# Patient Record
Sex: Female | Born: 1935
Health system: Southern US, Community
[De-identification: ages and names within clinical notes are randomized; demographics above are authoritative.]

## PROBLEM LIST (undated history)

## (undated) DIAGNOSIS — M1990 Unspecified osteoarthritis, unspecified site: Secondary | ICD-10-CM

## (undated) DIAGNOSIS — Z950 Presence of cardiac pacemaker: Secondary | ICD-10-CM

## (undated) DIAGNOSIS — Z8639 Personal history of other endocrine, nutritional and metabolic disease: Secondary | ICD-10-CM

## (undated) DIAGNOSIS — M4802 Spinal stenosis, cervical region: Secondary | ICD-10-CM

## (undated) DIAGNOSIS — E785 Hyperlipidemia, unspecified: Secondary | ICD-10-CM

## (undated) DIAGNOSIS — I7300 Raynaud's syndrome without gangrene: Secondary | ICD-10-CM

## (undated) DIAGNOSIS — M810 Age-related osteoporosis without current pathological fracture: Secondary | ICD-10-CM

## (undated) DIAGNOSIS — H918X9 Other specified hearing loss, unspecified ear: Secondary | ICD-10-CM

## (undated) DIAGNOSIS — I080 Rheumatic disorders of both mitral and aortic valves: Secondary | ICD-10-CM

## (undated) DIAGNOSIS — Z862 Personal history of diseases of the blood and blood-forming organs and certain disorders involving the immune mechanism: Secondary | ICD-10-CM

## (undated) DIAGNOSIS — I10 Essential (primary) hypertension: Secondary | ICD-10-CM

## (undated) HISTORY — PX: OTHER SURGICAL HISTORY: SHX169

## (undated) HISTORY — DX: Other specified hearing loss, unspecified ear: H91.8X9

## (undated) HISTORY — DX: Age-related osteoporosis without current pathological fracture: M81.0

## (undated) HISTORY — DX: Unspecified osteoarthritis, unspecified site: M19.90

## (undated) HISTORY — DX: Essential (primary) hypertension: I10

## (undated) HISTORY — PX: PACEMAKER INSERTION: SHX728

## (undated) HISTORY — DX: Raynaud's syndrome without gangrene: I73.00

## (undated) HISTORY — DX: Personal history of diseases of the blood and blood-forming organs and certain disorders involving the immune mechanism: Z86.2

## (undated) HISTORY — PX: MITRAL VALVE REPAIR: SHX2039

## (undated) HISTORY — DX: Presence of cardiac pacemaker: Z95.0

## (undated) HISTORY — DX: Rheumatic disorders of both mitral and aortic valves: I08.0

## (undated) HISTORY — PX: CATARACT EXTRACTION, BILATERAL: SHX1313

## (undated) HISTORY — DX: Personal history of other endocrine, nutritional and metabolic disease: Z86.39

## (undated) HISTORY — PX: APPENDECTOMY: SHX54

## (undated) HISTORY — PX: ABDOMINAL HYSTERECTOMY: SHX81

## (undated) HISTORY — DX: Hyperlipidemia, unspecified: E78.5

---

## 2000-05-10 ENCOUNTER — Inpatient Hospital Stay (HOSPITAL_COMMUNITY): Admission: EM | Admit: 2000-05-10 | Discharge: 2000-05-16 | Payer: Self-pay | Admitting: Emergency Medicine

## 2000-05-10 ENCOUNTER — Encounter: Payer: Self-pay | Admitting: Emergency Medicine

## 2000-05-13 ENCOUNTER — Encounter: Payer: Self-pay | Admitting: Internal Medicine

## 2000-05-14 ENCOUNTER — Encounter: Payer: Self-pay | Admitting: Internal Medicine

## 2000-05-15 ENCOUNTER — Encounter: Payer: Self-pay | Admitting: Internal Medicine

## 2000-05-16 ENCOUNTER — Encounter: Payer: Self-pay | Admitting: Internal Medicine

## 2001-09-14 ENCOUNTER — Ambulatory Visit (HOSPITAL_COMMUNITY): Admission: RE | Admit: 2001-09-14 | Discharge: 2001-09-14 | Payer: Self-pay | Admitting: Cardiology

## 2001-11-09 ENCOUNTER — Encounter: Payer: Self-pay | Admitting: Thoracic Surgery (Cardiothoracic Vascular Surgery)

## 2001-11-13 ENCOUNTER — Inpatient Hospital Stay (HOSPITAL_COMMUNITY)
Admission: RE | Admit: 2001-11-13 | Discharge: 2001-11-20 | Payer: Self-pay | Admitting: Thoracic Surgery (Cardiothoracic Vascular Surgery)

## 2001-11-13 ENCOUNTER — Encounter (INDEPENDENT_AMBULATORY_CARE_PROVIDER_SITE_OTHER): Payer: Self-pay | Admitting: *Deleted

## 2001-11-14 ENCOUNTER — Encounter: Payer: Self-pay | Admitting: Thoracic Surgery (Cardiothoracic Vascular Surgery)

## 2001-11-15 ENCOUNTER — Encounter: Payer: Self-pay | Admitting: Thoracic Surgery (Cardiothoracic Vascular Surgery)

## 2001-11-16 ENCOUNTER — Encounter: Payer: Self-pay | Admitting: Thoracic Surgery (Cardiothoracic Vascular Surgery)

## 2001-11-17 ENCOUNTER — Encounter: Payer: Self-pay | Admitting: Thoracic Surgery (Cardiothoracic Vascular Surgery)

## 2002-01-15 ENCOUNTER — Encounter (HOSPITAL_COMMUNITY): Admission: RE | Admit: 2002-01-15 | Discharge: 2002-04-15 | Payer: Self-pay | Admitting: Cardiology

## 2004-04-03 ENCOUNTER — Ambulatory Visit: Payer: Self-pay

## 2004-05-05 ENCOUNTER — Ambulatory Visit: Payer: Self-pay

## 2004-06-06 ENCOUNTER — Ambulatory Visit: Payer: Self-pay | Admitting: Internal Medicine

## 2004-06-30 ENCOUNTER — Ambulatory Visit: Payer: Self-pay | Admitting: Internal Medicine

## 2004-07-03 ENCOUNTER — Ambulatory Visit: Payer: Self-pay | Admitting: Cardiology

## 2004-07-15 ENCOUNTER — Ambulatory Visit: Payer: Self-pay | Admitting: Internal Medicine

## 2004-07-16 ENCOUNTER — Ambulatory Visit: Payer: Self-pay

## 2004-08-11 ENCOUNTER — Ambulatory Visit: Payer: Self-pay | Admitting: Cardiology

## 2004-08-13 ENCOUNTER — Ambulatory Visit: Payer: Self-pay | Admitting: Internal Medicine

## 2004-09-17 ENCOUNTER — Ambulatory Visit: Payer: Self-pay | Admitting: Internal Medicine

## 2004-10-19 ENCOUNTER — Ambulatory Visit: Payer: Self-pay | Admitting: Internal Medicine

## 2004-12-04 ENCOUNTER — Ambulatory Visit: Payer: Self-pay | Admitting: Internal Medicine

## 2005-01-01 ENCOUNTER — Ambulatory Visit: Payer: Self-pay | Admitting: Internal Medicine

## 2005-02-02 ENCOUNTER — Ambulatory Visit: Payer: Self-pay | Admitting: Internal Medicine

## 2005-02-17 ENCOUNTER — Ambulatory Visit: Payer: Self-pay | Admitting: Cardiology

## 2005-03-19 ENCOUNTER — Ambulatory Visit: Payer: Self-pay | Admitting: Internal Medicine

## 2005-04-19 ENCOUNTER — Ambulatory Visit: Payer: Self-pay | Admitting: Internal Medicine

## 2005-05-26 ENCOUNTER — Ambulatory Visit: Payer: Self-pay | Admitting: Internal Medicine

## 2005-07-07 ENCOUNTER — Ambulatory Visit: Payer: Self-pay | Admitting: Internal Medicine

## 2005-07-14 ENCOUNTER — Ambulatory Visit: Payer: Self-pay | Admitting: Internal Medicine

## 2005-09-13 ENCOUNTER — Ambulatory Visit: Payer: Self-pay

## 2005-10-13 ENCOUNTER — Ambulatory Visit: Payer: Self-pay | Admitting: Internal Medicine

## 2005-11-05 ENCOUNTER — Ambulatory Visit: Payer: Self-pay | Admitting: Cardiology

## 2005-11-09 ENCOUNTER — Ambulatory Visit: Payer: Self-pay | Admitting: Cardiology

## 2005-11-17 ENCOUNTER — Ambulatory Visit: Payer: Self-pay | Admitting: Internal Medicine

## 2005-11-22 ENCOUNTER — Encounter: Payer: Self-pay | Admitting: Cardiology

## 2005-11-22 ENCOUNTER — Ambulatory Visit: Payer: Self-pay

## 2005-11-29 ENCOUNTER — Ambulatory Visit: Payer: Self-pay | Admitting: Cardiology

## 2005-12-15 ENCOUNTER — Ambulatory Visit: Payer: Self-pay | Admitting: Internal Medicine

## 2006-01-04 ENCOUNTER — Ambulatory Visit: Payer: Self-pay | Admitting: Internal Medicine

## 2006-01-12 ENCOUNTER — Ambulatory Visit: Payer: Self-pay | Admitting: Internal Medicine

## 2006-01-19 ENCOUNTER — Ambulatory Visit: Payer: Self-pay | Admitting: Cardiology

## 2006-02-09 ENCOUNTER — Ambulatory Visit: Payer: Self-pay | Admitting: Internal Medicine

## 2006-03-18 ENCOUNTER — Ambulatory Visit: Payer: Self-pay | Admitting: Internal Medicine

## 2006-04-13 ENCOUNTER — Ambulatory Visit: Payer: Self-pay | Admitting: Internal Medicine

## 2006-05-13 ENCOUNTER — Ambulatory Visit: Payer: Self-pay | Admitting: Internal Medicine

## 2006-06-16 ENCOUNTER — Ambulatory Visit: Payer: Self-pay | Admitting: Internal Medicine

## 2006-07-13 ENCOUNTER — Ambulatory Visit: Payer: Self-pay | Admitting: Internal Medicine

## 2006-07-26 ENCOUNTER — Ambulatory Visit: Payer: Self-pay | Admitting: Cardiology

## 2006-07-28 ENCOUNTER — Ambulatory Visit: Payer: Self-pay | Admitting: Cardiology

## 2006-07-28 LAB — CONVERTED CEMR LAB
ALT: 12 units/L (ref 0–40)
AST: 22 units/L (ref 0–37)
Albumin: 3.3 g/dL — ABNORMAL LOW (ref 3.5–5.2)
Alkaline Phosphatase: 61 units/L (ref 39–117)
BUN: 14 mg/dL (ref 6–23)
Bilirubin, Direct: 0.1 mg/dL (ref 0.0–0.3)
CO2: 35 meq/L — ABNORMAL HIGH (ref 19–32)
Calcium: 9.3 mg/dL (ref 8.4–10.5)
Chloride: 105 meq/L (ref 96–112)
Cholesterol: 143 mg/dL (ref 0–200)
Creatinine, Ser: 0.9 mg/dL (ref 0.4–1.2)
GFR calc Af Amer: 78 mL/min
GFR calc non Af Amer: 64 mL/min
Glucose, Bld: 84 mg/dL (ref 70–99)
HDL: 66.9 mg/dL (ref >39.0)
LDL Cholesterol: 60 mg/dL (ref 0–99)
Potassium: 3.9 meq/L (ref 3.5–5.1)
Sodium: 145 meq/L (ref 135–145)
Total Bilirubin: 0.7 mg/dL (ref 0.3–1.2)
Total CHOL/HDL Ratio: 2.1
Total Protein: 6.2 g/dL (ref 6.0–8.3)
Triglycerides: 80 mg/dL (ref 0–149)
VLDL: 16 mg/dL (ref 0–40)

## 2006-08-10 ENCOUNTER — Ambulatory Visit: Payer: Self-pay | Admitting: Internal Medicine

## 2006-09-08 ENCOUNTER — Ambulatory Visit: Payer: Self-pay

## 2006-09-21 ENCOUNTER — Ambulatory Visit: Payer: Self-pay | Admitting: Internal Medicine

## 2006-12-15 ENCOUNTER — Ambulatory Visit: Payer: Self-pay | Admitting: Internal Medicine

## 2007-01-06 ENCOUNTER — Encounter: Payer: Self-pay | Admitting: Internal Medicine

## 2007-01-06 DIAGNOSIS — E785 Hyperlipidemia, unspecified: Secondary | ICD-10-CM | POA: Insufficient documentation

## 2007-01-06 DIAGNOSIS — I080 Rheumatic disorders of both mitral and aortic valves: Secondary | ICD-10-CM

## 2007-01-06 DIAGNOSIS — I10 Essential (primary) hypertension: Secondary | ICD-10-CM | POA: Insufficient documentation

## 2007-01-06 DIAGNOSIS — Z862 Personal history of diseases of the blood and blood-forming organs and certain disorders involving the immune mechanism: Secondary | ICD-10-CM

## 2007-01-06 DIAGNOSIS — Z950 Presence of cardiac pacemaker: Secondary | ICD-10-CM | POA: Insufficient documentation

## 2007-01-06 DIAGNOSIS — I7300 Raynaud's syndrome without gangrene: Secondary | ICD-10-CM | POA: Insufficient documentation

## 2007-01-06 DIAGNOSIS — Z8639 Personal history of other endocrine, nutritional and metabolic disease: Secondary | ICD-10-CM

## 2007-01-06 HISTORY — DX: Rheumatic disorders of both mitral and aortic valves: I08.0

## 2007-01-06 HISTORY — DX: Personal history of diseases of the blood and blood-forming organs and certain disorders involving the immune mechanism: Z86.2

## 2007-01-06 HISTORY — DX: Raynaud's syndrome without gangrene: I73.00

## 2007-01-06 HISTORY — DX: Presence of cardiac pacemaker: Z95.0

## 2007-01-14 DIAGNOSIS — M1990 Unspecified osteoarthritis, unspecified site: Secondary | ICD-10-CM | POA: Insufficient documentation

## 2007-01-14 DIAGNOSIS — M810 Age-related osteoporosis without current pathological fracture: Secondary | ICD-10-CM | POA: Insufficient documentation

## 2007-01-14 HISTORY — DX: Age-related osteoporosis without current pathological fracture: M81.0

## 2007-01-14 HISTORY — DX: Unspecified osteoarthritis, unspecified site: M19.90

## 2007-03-21 ENCOUNTER — Ambulatory Visit: Payer: Self-pay | Admitting: Internal Medicine

## 2007-06-06 ENCOUNTER — Encounter: Payer: Self-pay | Admitting: Internal Medicine

## 2007-06-21 ENCOUNTER — Ambulatory Visit: Payer: Self-pay | Admitting: Internal Medicine

## 2007-09-18 ENCOUNTER — Ambulatory Visit: Payer: Self-pay | Admitting: Internal Medicine

## 2007-10-09 ENCOUNTER — Encounter: Payer: Self-pay | Admitting: Internal Medicine

## 2007-11-28 ENCOUNTER — Ambulatory Visit: Payer: Self-pay | Admitting: Internal Medicine

## 2007-11-28 LAB — CONVERTED CEMR LAB
ALT: 10 units/L (ref 0–35)
AST: 29 units/L (ref 0–37)
Albumin: 3.7 g/dL (ref 3.5–5.2)
Alkaline Phosphatase: 94 units/L (ref 39–117)
BUN: 13 mg/dL (ref 6–23)
Basophils Absolute: 0.1 10*3/uL (ref 0.0–0.1)
Basophils Relative: 0.8 % (ref 0.0–3.0)
Bilirubin Urine: NEGATIVE
Bilirubin, Direct: 0.1 mg/dL (ref 0.0–0.3)
CO2: 31 meq/L (ref 19–32)
Calcium: 10.2 mg/dL (ref 8.4–10.5)
Chloride: 103 meq/L (ref 96–112)
Cholesterol: 146 mg/dL (ref 0–200)
Creatinine, Ser: 0.8 mg/dL (ref 0.4–1.2)
Eosinophils Absolute: 0.2 10*3/uL (ref 0.0–0.7)
Eosinophils Relative: 3.7 % (ref 0.0–5.0)
Folate: >20 ng/mL
GFR calc Af Amer: 89 mL/min
GFR calc non Af Amer: 73 mL/min
Glucose, Bld: 90 mg/dL (ref 70–99)
HCT: 37.7 % (ref 36.0–46.0)
HDL: 48.7 mg/dL (ref >39.0)
Hemoglobin: 12.8 g/dL (ref 12.0–15.0)
Ketones, ur: NEGATIVE mg/dL
LDL Cholesterol: 72 mg/dL (ref 0–99)
Leukocytes, UA: NEGATIVE
Lymphocytes Relative: 29.8 % (ref 12.0–46.0)
MCHC: 33.9 g/dL (ref 30.0–36.0)
MCV: 97.2 fL (ref 78.0–100.0)
Monocytes Absolute: 0.6 10*3/uL (ref 0.1–1.0)
Monocytes Relative: 8.7 % (ref 3.0–12.0)
Neutro Abs: 3.8 10*3/uL (ref 1.4–7.7)
Neutrophils Relative %: 57 % (ref 43.0–77.0)
Nitrite: NEGATIVE
Platelets: 250 10*3/uL (ref 150–400)
Potassium: 3.8 meq/L (ref 3.5–5.1)
RBC: 3.88 M/uL (ref 3.87–5.11)
RDW: 13.2 % (ref 11.5–14.6)
Sed Rate: 35 mm/hr — ABNORMAL HIGH (ref 0–22)
Sodium: 141 meq/L (ref 135–145)
Specific Gravity, Urine: 1.015 (ref 1.000–1.03)
TSH: 1.18 microintl units/mL (ref 0.35–5.50)
Total Bilirubin: 0.8 mg/dL (ref 0.3–1.2)
Total CHOL/HDL Ratio: 3
Total Protein, Urine: NEGATIVE mg/dL
Total Protein: 6.7 g/dL (ref 6.0–8.3)
Triglycerides: 125 mg/dL (ref 0–149)
Urine Glucose: NEGATIVE mg/dL
Urobilinogen, UA: 0.2 (ref 0.0–1.0)
VLDL: 25 mg/dL (ref 0–40)
Vitamin B-12: 544 pg/mL (ref 211–911)
WBC: 6.7 10*3/uL (ref 4.5–10.5)
pH: 7 (ref 5.0–8.0)

## 2007-11-29 LAB — CONVERTED CEMR LAB: Vit D, 1,25-Dihydroxy: 41 (ref 30–89)

## 2007-12-20 ENCOUNTER — Ambulatory Visit: Payer: Self-pay | Admitting: Internal Medicine

## 2008-01-09 ENCOUNTER — Ambulatory Visit: Payer: Self-pay | Admitting: Cardiovascular Disease

## 2008-01-09 ENCOUNTER — Inpatient Hospital Stay (HOSPITAL_COMMUNITY): Admission: EM | Admit: 2008-01-09 | Discharge: 2008-01-11 | Payer: Self-pay | Admitting: Emergency Medicine

## 2008-01-10 ENCOUNTER — Encounter: Payer: Self-pay | Admitting: Cardiovascular Disease

## 2008-01-16 ENCOUNTER — Ambulatory Visit: Payer: Self-pay

## 2008-01-26 ENCOUNTER — Ambulatory Visit: Payer: Self-pay | Admitting: Cardiology

## 2008-01-26 LAB — CONVERTED CEMR LAB
CO2: 33 meq/L — ABNORMAL HIGH (ref 19–32)
Chloride: 106 meq/L (ref 96–112)
Creatinine, Ser: 0.9 mg/dL (ref 0.4–1.2)
GFR calc non Af Amer: 64 mL/min
Potassium: 4 meq/L (ref 3.5–5.1)

## 2008-03-22 ENCOUNTER — Ambulatory Visit: Payer: Self-pay | Admitting: Internal Medicine

## 2008-04-09 ENCOUNTER — Encounter: Payer: Self-pay | Admitting: Internal Medicine

## 2008-05-06 ENCOUNTER — Ambulatory Visit: Payer: Self-pay | Admitting: Internal Medicine

## 2008-08-12 ENCOUNTER — Ambulatory Visit: Payer: Self-pay

## 2008-08-12 ENCOUNTER — Encounter: Payer: Self-pay | Admitting: Internal Medicine

## 2008-08-22 ENCOUNTER — Ambulatory Visit: Payer: Self-pay | Admitting: Internal Medicine

## 2008-08-28 ENCOUNTER — Telehealth: Payer: Self-pay | Admitting: Cardiology

## 2008-08-29 ENCOUNTER — Ambulatory Visit: Payer: Self-pay | Admitting: Internal Medicine

## 2008-08-30 LAB — CONVERTED CEMR LAB
Basophils Relative: 0.4 % (ref 0.0–3.0)
CO2: 29 meq/L (ref 19–32)
Calcium: 9.9 mg/dL (ref 8.4–10.5)
Chloride: 108 meq/L (ref 96–112)
Eosinophils Relative: 3.3 % (ref 0.0–5.0)
Glucose, Bld: 87 mg/dL (ref 70–99)
INR: 1.1 — ABNORMAL HIGH (ref 0.8–1.0)
Lymphocytes Relative: 25.2 % (ref 12.0–46.0)
Monocytes Absolute: 0.6 10*3/uL (ref 0.1–1.0)
Monocytes Relative: 8.1 % (ref 3.0–12.0)
Neutrophils Relative %: 63 % (ref 43.0–77.0)
Platelets: 240 10*3/uL (ref 150.0–400.0)
Potassium: 5.4 meq/L — ABNORMAL HIGH (ref 3.5–5.1)
RBC: 4.01 M/uL (ref 3.87–5.11)
Sed Rate: 23 mm/hr — ABNORMAL HIGH (ref 0–22)
Sodium: 143 meq/L (ref 135–145)
TSH: 1.99 microintl units/mL (ref 0.35–5.50)
WBC: 6.9 10*3/uL (ref 4.5–10.5)
aPTT: 29 s — ABNORMAL HIGH (ref 21.7–28.8)

## 2008-09-05 ENCOUNTER — Ambulatory Visit (HOSPITAL_COMMUNITY): Admission: RE | Admit: 2008-09-05 | Discharge: 2008-09-05 | Payer: Self-pay | Admitting: Internal Medicine

## 2008-09-05 ENCOUNTER — Ambulatory Visit: Payer: Self-pay | Admitting: Internal Medicine

## 2008-09-06 ENCOUNTER — Encounter: Payer: Self-pay | Admitting: Internal Medicine

## 2008-09-10 ENCOUNTER — Telehealth (INDEPENDENT_AMBULATORY_CARE_PROVIDER_SITE_OTHER): Payer: Self-pay | Admitting: *Deleted

## 2008-09-23 ENCOUNTER — Encounter: Payer: Self-pay | Admitting: Internal Medicine

## 2008-10-16 ENCOUNTER — Encounter: Payer: Self-pay | Admitting: Internal Medicine

## 2008-12-19 ENCOUNTER — Ambulatory Visit: Payer: Self-pay | Admitting: Internal Medicine

## 2009-01-01 ENCOUNTER — Encounter: Payer: Self-pay | Admitting: Internal Medicine

## 2009-03-04 ENCOUNTER — Ambulatory Visit: Payer: Self-pay | Admitting: Internal Medicine

## 2009-03-05 LAB — CONVERTED CEMR LAB
ALT: 17 units/L (ref 0–35)
AST: 32 units/L (ref 0–37)
Albumin: 4 g/dL (ref 3.5–5.2)
BUN: 19 mg/dL (ref 6–23)
Chloride: 105 meq/L (ref 96–112)
Cholesterol: 174 mg/dL (ref 0–200)
Eosinophils Relative: 0.9 % (ref 0.0–5.0)
GFR calc non Af Amer: 63.71 mL/min (ref >60)
Glucose, Bld: 102 mg/dL — ABNORMAL HIGH (ref 70–99)
HCT: 41.4 % (ref 36.0–46.0)
Hemoglobin: 14.3 g/dL (ref 12.0–15.0)
Lymphs Abs: 1.3 10*3/uL (ref 0.7–4.0)
MCV: 101.2 fL — ABNORMAL HIGH (ref 78.0–100.0)
Monocytes Absolute: 0.4 10*3/uL (ref 0.1–1.0)
Monocytes Relative: 5.9 % (ref 3.0–12.0)
Neutro Abs: 5.4 10*3/uL (ref 1.4–7.7)
Nitrite: NEGATIVE
Platelets: 233 10*3/uL (ref 150.0–400.0)
Potassium: 4.6 meq/L (ref 3.5–5.1)
RDW: 13.6 % (ref 11.5–14.6)
Sodium: 144 meq/L (ref 135–145)
Specific Gravity, Urine: 1.015 (ref 1.000–1.030)
Total Protein, Urine: NEGATIVE mg/dL
Triglycerides: 84 mg/dL (ref 0.0–149.0)
Urine Glucose: NEGATIVE mg/dL
WBC: 7.2 10*3/uL (ref 4.5–10.5)
pH: 7.5 (ref 5.0–8.0)

## 2009-04-16 ENCOUNTER — Encounter: Payer: Self-pay | Admitting: Internal Medicine

## 2009-07-16 ENCOUNTER — Encounter: Payer: Self-pay | Admitting: Internal Medicine

## 2009-08-01 ENCOUNTER — Ambulatory Visit: Payer: Self-pay | Admitting: Internal Medicine

## 2009-08-01 DIAGNOSIS — H918X9 Other specified hearing loss, unspecified ear: Secondary | ICD-10-CM

## 2009-08-01 DIAGNOSIS — H6120 Impacted cerumen, unspecified ear: Secondary | ICD-10-CM | POA: Insufficient documentation

## 2009-08-01 DIAGNOSIS — H60339 Swimmer's ear, unspecified ear: Secondary | ICD-10-CM | POA: Insufficient documentation

## 2009-08-01 HISTORY — DX: Other specified hearing loss, unspecified ear: H91.8X9

## 2009-08-04 ENCOUNTER — Encounter: Payer: Self-pay | Admitting: Internal Medicine

## 2009-08-19 ENCOUNTER — Ambulatory Visit: Payer: Self-pay | Admitting: Internal Medicine

## 2009-09-03 ENCOUNTER — Encounter: Payer: Self-pay | Admitting: Internal Medicine

## 2009-10-02 ENCOUNTER — Ambulatory Visit: Payer: Self-pay | Admitting: Internal Medicine

## 2009-10-02 DIAGNOSIS — L02419 Cutaneous abscess of limb, unspecified: Secondary | ICD-10-CM | POA: Insufficient documentation

## 2009-10-02 DIAGNOSIS — M25579 Pain in unspecified ankle and joints of unspecified foot: Secondary | ICD-10-CM | POA: Insufficient documentation

## 2009-10-02 DIAGNOSIS — L03119 Cellulitis of unspecified part of limb: Secondary | ICD-10-CM

## 2009-10-02 DIAGNOSIS — M79609 Pain in unspecified limb: Secondary | ICD-10-CM | POA: Insufficient documentation

## 2009-10-03 ENCOUNTER — Encounter: Payer: Self-pay | Admitting: Internal Medicine

## 2009-10-03 ENCOUNTER — Ambulatory Visit: Payer: Self-pay

## 2009-10-03 ENCOUNTER — Telehealth: Payer: Self-pay | Admitting: Internal Medicine

## 2009-10-07 ENCOUNTER — Telehealth: Payer: Self-pay | Admitting: Internal Medicine

## 2009-10-16 ENCOUNTER — Telehealth: Payer: Self-pay | Admitting: Internal Medicine

## 2009-10-29 ENCOUNTER — Encounter: Payer: Self-pay | Admitting: Internal Medicine

## 2009-11-20 ENCOUNTER — Encounter: Payer: Self-pay | Admitting: Internal Medicine

## 2009-11-20 ENCOUNTER — Ambulatory Visit: Payer: Self-pay | Admitting: Internal Medicine

## 2009-11-21 ENCOUNTER — Encounter (INDEPENDENT_AMBULATORY_CARE_PROVIDER_SITE_OTHER): Payer: Self-pay | Admitting: *Deleted

## 2009-11-21 ENCOUNTER — Ambulatory Visit: Payer: Self-pay | Admitting: Cardiology

## 2009-11-26 ENCOUNTER — Encounter: Payer: Self-pay | Admitting: Internal Medicine

## 2009-12-04 ENCOUNTER — Ambulatory Visit: Payer: Self-pay | Admitting: Cardiology

## 2009-12-04 ENCOUNTER — Ambulatory Visit (HOSPITAL_COMMUNITY): Admission: RE | Admit: 2009-12-04 | Discharge: 2009-12-04 | Payer: Self-pay | Admitting: Cardiology

## 2009-12-04 ENCOUNTER — Encounter: Payer: Self-pay | Admitting: Cardiology

## 2009-12-04 ENCOUNTER — Ambulatory Visit: Payer: Self-pay

## 2010-01-29 ENCOUNTER — Encounter: Payer: Self-pay | Admitting: Internal Medicine

## 2010-02-26 ENCOUNTER — Ambulatory Visit: Payer: Self-pay | Admitting: Internal Medicine

## 2010-05-30 ENCOUNTER — Encounter (INDEPENDENT_AMBULATORY_CARE_PROVIDER_SITE_OTHER): Payer: Self-pay | Admitting: *Deleted

## 2010-06-07 LAB — CONVERTED CEMR LAB
ALT: 15 units/L (ref 0–35)
AST: 28 units/L (ref 0–37)
Alkaline Phosphatase: 58 units/L (ref 39–117)
BUN: 19 mg/dL (ref 6–23)
Bilirubin, Direct: 0.1 mg/dL (ref 0.0–0.3)
Creatinine, Ser: 0.9 mg/dL (ref 0.4–1.2)
GFR calc non Af Amer: 67.03 mL/min (ref >60)
Glucose, Bld: 62 mg/dL — ABNORMAL LOW (ref 70–99)
Potassium: 3.9 meq/L (ref 3.5–5.1)
Total Bilirubin: 0.6 mg/dL (ref 0.3–1.2)
VLDL: 20 mg/dL (ref 0.0–40.0)

## 2010-06-08 ENCOUNTER — Encounter: Payer: Self-pay | Admitting: Internal Medicine

## 2010-06-08 ENCOUNTER — Ambulatory Visit
Admission: RE | Admit: 2010-06-08 | Discharge: 2010-06-08 | Payer: Self-pay | Source: Home / Self Care | Attending: Internal Medicine | Admitting: Internal Medicine

## 2010-06-11 NOTE — Cardiovascular Report (Signed)
Summary: Office Visit Remote   Office Visit Remote   Imported By: Roderic Ovens 03/12/2010 16:19:14  _____________________________________________________________________  External Attachment:    Type:   Image     Comment:   External Document

## 2010-06-11 NOTE — Miscellaneous (Signed)
Summary: Orders Update  Clinical Lists Changes  Orders: Added new Test order of Venous Duplex Lower Extremity (Venous Duplex Lower) - Signed 

## 2010-06-11 NOTE — Assessment & Plan Note (Signed)
Summary: swollen ankle/cd   Vital Signs:  Patient profile:   77 year old female Height:      64 inches Weight:      127.50 pounds BMI:     21.96 O2 Sat:      97 % on Room air Temp:     97.3 degrees F oral Pulse rate:   81 / minute BP sitting:   112 / 68  (left arm) Cuff size:   regular  Vitals Entered ByZella Ball Ewing (Oct 02, 2009 3:33 PM)  O2 Flow:  Room air CC: Left ankle and foot > right swollen for 3 weeks/RE   Primary Care Provider:  Corwin Levins MD  CC:  Left ankle and foot > right swollen for 3 weeks/RE.  History of Present Illness: here with 3 wks new onset bilat LE discomfort and edema/swelling without erythema or ulcers, but with signficant discomfort and distress to her;  no prior hx of this,  Pt denies CP, sob, doe, wheezing, orthopnea, pnd, palps, dizziness or syncope .  Pt denies new neuro symptoms such as headache, facial or extremity weakness  Discomfort seems worse at night, with improvement in the swelling nearly completly by the next am, then recurs during the day.  Denies calf pain, recent increased overexertion, falls.    Preventive Screening-Counseling & Management      Drug Use:  no.    Problems Prior to Update: 1)  Ankle Pain, Left  (ICD-719.47) 2)  Cellulitis, Leg, Left  (ICD-682.6) 3)  Cerumen Impaction, Right  (ICD-380.4) 4)  Other Specified Forms of Hearing Loss  (ICD-389.8) 5)  Otitis Externa, Acute  (ICD-380.12) 6)  Preventive Health Care  (ICD-V70.0) 7)  Preventive Health Care  (ICD-V70.0) 8)  Osteoarthritis  (ICD-715.90) 9)  Mitral Regurgitation  (ICD-396.3) 10)  Osteoporosis  (ICD-733.00) 11)  Raynaud's Syndrome  (ICD-443.0) 12)  Arthritis, Rheumatoid, Hx of  (ICD-V12.2) 13)  Hypertension  (ICD-401.9) 14)  Hyperlipidemia  (ICD-272.4) 15)  Pacemaker, Permanent  (ICD-V45.01)  Medications Prior to Update: 1)  Altoprev 60 Mg Tb24 (Lovastatin) .Marland Kitchen.. 1 By Mouth Once Daily 2)  Metoprolol Succinate 50 Mg Tb24 (Metoprolol Succinate) .... One  and A Half By Mouth Daily 3)  Ecotrin Low Strength 81 Mg  Tbec (Aspirin) .... Take 1 Tablet By Mouth Once A Day 4)  Lisinopril 10 Mg Tabs (Lisinopril) .... One By Mouth Daily 5)  Calcium 500 Mg Tabs (Calcium Carbonate) .... 2 By Mouth Daily 6)  Cerovite Silver  Tabs (Multiple Vitamins-Minerals) .... One By Mouth Daily 7)  Cleocin 300 Mg Caps (Clindamycin Hcl) .... 2 By Mouth Before Dental Procedures 8)  Vitamin D 1000 Unit Tabs (Cholecalciferol) .... One By Mouth Daily  Current Medications (verified): 1)  Altoprev 60 Mg Tb24 (Lovastatin) .Marland Kitchen.. 1 By Mouth Once Daily 2)  Metoprolol Succinate 50 Mg Tb24 (Metoprolol Succinate) .... One and A Half By Mouth Daily 3)  Ecotrin Low Strength 81 Mg  Tbec (Aspirin) .... Take 1 Tablet By Mouth Once A Day 4)  Lisinopril 10 Mg Tabs (Lisinopril) .... One By Mouth Daily 5)  Calcium 500 Mg Tabs (Calcium Carbonate) .... 2 By Mouth Daily 6)  Cerovite Silver  Tabs (Multiple Vitamins-Minerals) .... One By Mouth Daily 7)  Cleocin 300 Mg Caps (Clindamycin Hcl) .... 2 By Mouth Before Dental Procedures As Needed 8)  Vitamin D 1000 Unit Tabs (Cholecalciferol) .... One By Mouth Daily 9)  Doxycycline Hyclate 100 Mg Caps (Doxycycline Hyclate) .Marland Kitchen.. 1po Two Times A  Day  Allergies (verified): 1)  ! Penicillin 2)  ! Keflex 3)  ! 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   Head:  normocephalic and atraumatic.   Eyes:  vision grossly intact, pupils equal, and pupils round.   Ears:  R ear normal and L ear normal.   Nose:  no external deformity and no nasal discharge.   Mouth:  no gingival abnormalities and pharynx pink and moist.   Neck:  supple and no masses.   Lungs:  normal respiratory effort and normal breath sounds.   Heart:  normal rate and regular rhythm.   Msk:  no joint tenderness and no joint swelling.  such as knee effusions Extremities:  bilat LE edema left 2+> right 1+ to mid calves, no erythema or tenderness to calves, neg homans Skin:  left ankle area with nondiscrete area approx 4 cm oval area erythema, tender   Impression & Recommendations:  Problem # 1:  LEG PAIN, LEFT (ICD-729.5)  with asymmetric swelling - cant r/o dvt  - to check urgent LE dopplers venous now, for tylenol as needed for now, also films left ankle as well when able, consider low dose lasix, declines labs today such as bmet to r/o edema due to renal insuff; to consider if above neg  Orders: Radiology Referral (Radiology)  Problem # 2:  CELLULITIS, LEG, LEFT (ICD-682.6)  Her updated medication list for this problem includes:    Cleocin 300 Mg Caps (Clindamycin hcl) .Marland Kitchen... 2 by mouth before dental procedures as needed    Doxycycline Hyclate 100 Mg Caps (Doxycycline hyclate) .Marland Kitchen... 1po two times a day  cant r/o cellultitis left ankle, for treat as above, f/u any worsening signs or symptoms , consider gout but would be atypical by PE  Problem # 3:  HYPERTENSION (ICD-401.9)  Her updated medication list for this problem includes:    Metoprolol Succinate 50 Mg Tb24 (Metoprolol  succinate) ..... One and a half by mouth daily    Lisinopril 10 Mg Tabs (Lisinopril) ..... One by mouth daily  BP today: 112/68 Prior BP: 160/80 (08/19/2009)  Labs Reviewed: K+: 4.6 (03/04/2009) Creat: : 0.9 (03/04/2009)   Chol: 174 (03/04/2009)   HDL: 80.30 (03/04/2009)   LDL: 77 (03/04/2009)   TG: 84.0 (03/04/2009) stable overall by hx and exam, ok to continue meds/tx as is   Complete Medication List: 1)  Altoprev 60 Mg Tb24 (Lovastatin) .Marland Kitchen.. 1 by mouth once daily 2)  Metoprolol Succinate 50 Mg Tb24 (Metoprolol succinate) .... One and a half by mouth daily 3)  Ecotrin Low Strength 81 Mg Tbec (Aspirin) .... Take 1 tablet by mouth once a day 4)  Lisinopril 10 Mg Tabs (Lisinopril) .... One by mouth daily 5)  Calcium 500  Mg Tabs (Calcium carbonate) .... 2 by mouth daily 6)  Cerovite Silver Tabs (Multiple vitamins-minerals) .... One by mouth daily 7)  Cleocin 300 Mg Caps (Clindamycin hcl) .... 2 by mouth before dental procedures as needed 8)  Vitamin D 1000 Unit Tabs (Cholecalciferol) .... One by mouth daily 9)  Doxycycline Hyclate 100 Mg Caps (Doxycycline hyclate) .Marland Kitchen.. 1po two times a day  Other Orders: T-Ankle Comp Left Min 3 Views ([id number]) T-Foot Left Min 3 Views [id number])  Patient Instructions: 1)  You will be contacted about the referral(s) to: left leg venous doppler ultrasound, to be done today hopefully 2)  Please take all new medications as prescribed - the antibiotic 3)  when able, Please go to Radiology in the basement level for your X-Ray 4)  you can also use tylenol as needed for pain 5)  Continue all previous medications as before this visit  6)  Please schedule a follow-up appointment in 5 months with CPX labs, or sooner if needed Prescriptions: CLEOCIN 300 MG CAPS (CLINDAMYCIN HCL) 2 by mouth before dental procedures as needed  #4 x 0   Entered and Authorized by:   Corwin Levins MD   Signed by:   Corwin Levins MD on 10/02/2009   Method used:   Electronically to         [facility]* (retail)       [address]       [place], [place]  [address]       Ph: [phone]       Fax: [phone]   RxID:   [id number] DOXYCYCLINE HYCLATE 100 MG CAPS (DOXYCYCLINE HYCLATE) 1po two times a day  #20 x 0   Entered and Authorized by:   Corwin Levins MD   Signed by:   Corwin Levins MD on 10/02/2009   Method used:   Electronically to        [facility]* (retail)       [address]       [place], [place]  [address]       Ph: [phone]       Fax: [phone]   RxID:   [id number]

## 2010-06-11 NOTE — Assessment & Plan Note (Signed)
Summary: started jaw to ear pain--now more ear than jaw--stc   Vital Signs:  Patient profile:   77 year old female Height:      63 inches Weight:      127.13 pounds BMI:     22.60 O2 Sat:      98 % on Room air Temp:     97.7 degrees F oral Pulse rate:   88 / minute BP sitting:   134 / 82  (left arm) Cuff size:   regular  Vitals Entered ByZella Ball Ewing (August 01, 2009 3:05 PM)  O2 Flow:  Room air CC: Right ear pain/RE   CC:  Right ear pain/RE.  History of Present Illness: here with 1 wk increase pain to the area of the right ear - sometimes seems to include the pinaa but no erythema or sweling ;  has some discomfort to the right canal, no drainage but has "stoppage" to the ear and some hearing loss intermittent;  has known hx of right TMJ wih some difficulty opening the mouth such as at the dentist; and some worsening with eating recently but no jaw claudication type symptoms or vision problem, no temple tednerness or pain.  No other headache, fever, chills , ST, cough and Pt denies CP, sob, doe, wheezing, orthopnea, pnd, worsening LE edema, palps, dizziness or syncope , or confusion.  Problems Prior to Update: 1)  Cerumen Impaction, Right  (ICD-380.4) 2)  Other Specified Forms of Hearing Loss  (ICD-389.8) 3)  Otitis Externa, Acute  (ICD-380.12) 4)  Preventive Health Care  (ICD-V70.0) 5)  Preventive Health Care  (ICD-V70.0) 6)  Osteoarthritis  (ICD-715.90) 7)  Mitral Regurgitation  (ICD-396.3) 8)  Osteoporosis  (ICD-733.00) 9)  Raynaud's Syndrome  (ICD-443.0) 10)  Arthritis, Rheumatoid, Hx of  (ICD-V12.2) 11)  Hypertension  (ICD-401.9) 12)  Hyperlipidemia  (ICD-272.4) 13)  Pacemaker, Permanent  (ICD-V45.01)  Medications Prior to Update: 1)  Altoprev 60 Mg Tb24 (Lovastatin) .Marland Kitchen.. 1 By Mouth Once Daily 2)  Metoprolol Succinate 50 Mg Tb24 (Metoprolol Succinate) .... One and A Half By Mouth Daily 3)  Ecotrin Low Strength 81 Mg  Tbec (Aspirin) .... Take 1 Tablet By Mouth Once A  Day 4)  Lisinopril 10 Mg Tabs (Lisinopril) .... One By Mouth Daily 5)  Aspir-Low 81 Mg Tbec (Aspirin) .... One By Mouth Daily 6)  Calcium 500 Mg Tabs (Calcium Carbonate) .... 2 By Mouth Daily 7)  Cerovite Silver  Tabs (Multiple Vitamins-Minerals) .... One By Mouth Daily 8)  Cleocin 300 Mg Caps (Clindamycin Hcl) .... 2 By Mouth Before Dental Procedures 9)  Forteo 600 Mcg/2.74ml Soln (Teriparatide (Recombinant)) .... Sub-Q Daily 10)  Vitamin D 1000 Unit Tabs (Cholecalciferol) .... One By Mouth Daily 11)  Prednisone 1 Mg Tabs (Prednisone) .... 3 By Mouth Once Daily  Current Medications (verified): 1)  Altoprev 60 Mg Tb24 (Lovastatin) .Marland Kitchen.. 1 By Mouth Once Daily 2)  Metoprolol Succinate 50 Mg Tb24 (Metoprolol Succinate) .... One and A Half By Mouth Daily 3)  Ecotrin Low Strength 81 Mg  Tbec (Aspirin) .... Take 1 Tablet By Mouth Once A Day 4)  Lisinopril 10 Mg Tabs (Lisinopril) .... One By Mouth Daily 5)  Calcium 500 Mg Tabs (Calcium Carbonate) .... 2 By Mouth Daily 6)  Cerovite Silver  Tabs (Multiple Vitamins-Minerals) .... One By Mouth Daily 7)  Cleocin 300 Mg Caps (Clindamycin Hcl) .... 2 By Mouth Before Dental Procedures 8)  Vitamin D 1000 Unit Tabs (Cholecalciferol) .... One By Mouth Daily  9)  Prednisone 1 Mg Tabs (Prednisone) .Marland Kitchen.. 1 By Mouth Once Daily 10)  Ciprofloxacin Hcl 500 Mg Tabs (Ciprofloxacin Hcl) .Marland Kitchen.. 1 By Mouth Two Times A Day  Allergies (verified): 1)  ! Penicillin 2)  ! Keflex 3)  ! 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   Eyes:  vision grossly intact, pupils equal, and pupils round.   Ears:  right pinna without erythema, swelling or tenderness,  right canal impacted and unable to be cleared completly and incomplete exam of the canal only performed with mild red, swelling and no d/c;  left TM and canal clear Nose:  no external deformity and no nasal discharge.   Mouth:  no gingival abnormalities and pharynx pink and moist.   Neck:  supple and no masses.   Lungs:  normal respiratory effort and normal breath sounds.   Heart:  normal rate and regular rhythm.   Msk:  no right temple tender or swelling;  right TMJ area wih crepitus and decrased ROM but no pain elicited on palpation or active motion per pt;   Extremities:  no edema, no erythema  Neurologic:  alert & oriented X3 and cranial nerves II-XII intact.     Impression & Recommendations:  Problem # 1:  OTITIS EXTERNA, ACUTE (ICD-380.12) presumed by hx, and somewhat limited exam today;  will tx with cipro course by mouth, and refer ENT for full eval of ear canal;  differential for pain also includes TMJ and trigeminal neuralgia, but will need to tx as above , consider further eval and tx pending clinical response  Orders: ENT Referral (ENT)  Problem # 2:  OTHER SPECIFIED FORMS OF HEARING LOSS (ICD-389.8) as above, treat as above, f/u any worsening signs or symptoms  Orders: ENT Referral (ENT)  Problem # 3:  HYPERTENSION (ICD-401.9)  Her updated medication list for this problem includes:    Metoprolol Succinate 50 Mg Tb24 (Metoprolol succinate) ..... One and a half by mouth daily    Lisinopril 10 Mg  Tabs (Lisinopril) ..... One by mouth daily  BP today: 134/82 Prior BP: 138/80 (03/04/2009)  Labs Reviewed: K+: 4.6 (03/04/2009) Creat: : 0.9 (03/04/2009)   Chol: 174 (03/04/2009)   HDL: 80.30 (03/04/2009)   LDL: 77 (03/04/2009)   TG: 84.0 (03/04/2009) stable overall by hx and exam, ok to continue meds/tx as is   Problem # 4:  CERUMEN IMPACTION, RIGHT (ICD-380.4) as above, irrigation unable to completly clear the impaction Orders: ENT Referral (ENT)  Complete Medication List: 1)  Altoprev 60 Mg Tb24 (Lovastatin) .Marland Kitchen.. 1 by mouth once daily 2)  Metoprolol Succinate 50 Mg Tb24 (Metoprolol succinate) .... One and a half by mouth daily 3)  Ecotrin Low Strength 81 Mg Tbec (Aspirin) .... Take 1 tablet by mouth once a day 4)  Lisinopril 10 Mg Tabs (Lisinopril) .... One  by mouth daily 5)  Calcium 500 Mg Tabs (Calcium carbonate) .... 2 by mouth daily 6)  Cerovite Silver Tabs (Multiple vitamins-minerals) .... One by mouth daily 7)  Cleocin 300 Mg Caps (Clindamycin hcl) .... 2 by mouth before dental procedures 8)  Vitamin D 1000 Unit Tabs (Cholecalciferol) .... One by mouth daily 9)  Prednisone 1 Mg Tabs (Prednisone) .Marland Kitchen.. 1 by mouth once daily 10)  Ciprofloxacin Hcl 500 Mg Tabs (Ciprofloxacin hcl) .Marland Kitchen.. 1 by mouth two times a day  Patient Instructions: 1)  Please take all new medications as prescribed - the antibiotic was sent to your pharmacy 2)  Continue all previous medications as before this visit  3)  You will be contacted about the referral(s) to: ENT for the ear 4)  Please schedule a follow-up appointment in OCt 2011 with CPX labs Prescriptions: CIPROFLOXACIN HCL 500 MG TABS (CIPROFLOXACIN HCL) 1 by mouth two times a day  #10 x 0   Entered and Authorized by:   Corwin Levins MD   Signed by:   Corwin Levins MD on 08/01/2009   Method used:   Electronically to        [facility]* (retail)       [address]       [place], [place]  [id number]       Ph: [phone]        Fax: [phone]   RxID:   [id number]

## 2010-06-11 NOTE — Progress Notes (Signed)
----   Converted from flag ---- ---- 10/05/2009 7:20 PM, Corwin Levins MD wrote: please call pt to see if leg pain , swelling and redness is improved - if not, she should consider re-eval as her dopplers and xrays were essentially neg ------------------------------  called pt  and her leg is somewhat better. The redness is mostly gone, but still some swelling especially after standing. She has elevated it mostly over the weekend. The medication is causing some discomfort about an hour after she takes it but once she eats she does feel better. noted Corwin Levins MD  Oct 07, 2009 10:24 AM

## 2010-06-11 NOTE — Letter (Signed)
Summary: Remote Device Check  [employer], Main Office  [address]   [place], [place] [address]   Phone: [phone]  Fax: [phone]     November 26, 2009 MRN: [id number]   Alexis Cobb [address] [place], [place]  [address]   Dear Ms. [facility],   Your remote transmission was recieved and reviewed by your physician.  All diagnostics were within normal limits for you.  __X___Your next transmission is scheduled for:   02-26-2010.  Please transmit at any time this day.  If you have a wireless device your transmission will be sent automatically.  ______Your next office visit is scheduled for:                              . Please call our office to schedule an appointment.    Sincerely,  Vella Kohler

## 2010-06-11 NOTE — Assessment & Plan Note (Signed)
Summary: [employer]/saf   Visit Type:  Follow-up Primary Provider:  Corwin Levins MD   History of Present Illness: [name] returns today for followup.  She is a pleasant elderly woman with a h/o symptomatic tachybrady syndrome who is s/p PPM.  She has done well since her PPM was placed.  She denies c/p or sob.  No peripheral edema. She admits to dietary indiscretion with sodium.  Her blood pressure is up a bit.  Current Medications (verified): 1)  Altoprev 60 Mg Tb24 (Lovastatin) .Marland Kitchen.. 1 By Mouth Once Daily 2)  Metoprolol Succinate 50 Mg Tb24 (Metoprolol Succinate) .... One and A Half By Mouth Daily 3)  Ecotrin Low Strength 81 Mg  Tbec (Aspirin) .... Take 1 Tablet By Mouth Once A Day 4)  Lisinopril 10 Mg Tabs (Lisinopril) .... One By Mouth Daily 5)  Calcium 500 Mg Tabs (Calcium Carbonate) .... 2 By Mouth Daily 6)  Cerovite Silver  Tabs (Multiple Vitamins-Minerals) .... One By Mouth Daily 7)  Cleocin 300 Mg Caps (Clindamycin Hcl) .... 2 By Mouth Before Dental Procedures 8)  Vitamin D 1000 Unit Tabs (Cholecalciferol) .... One By Mouth Daily  Allergies: 1)  ! Penicillin 2)  ! Keflex 3)  ! * Actonel  Past History:  Past Medical History: Last updated: 11/28/2007 Hyperlipidemia Hypertension Arthritis-Rheumatoid carotid sinus hypersensitivity Pacemaker, Permanent Hx of Mitral Regurgitation Raynaud's Syndrome Osteoporosis Osteoarthritis- C-Spine and Shoulders hx of shingles 2005 hx of DJD c-spine and shoulders  Past Surgical History: Last updated: 11/28/2007 mitral valve repair Hysterectomy Appendectomy Tonsillectomy s/p benign ovary tumor s/p permanent pacemaker 2002  Review of Systems       The patient complains of peripheral edema.  The patient denies chest pain, syncope, and dyspnea on exertion.    Vital Signs:  Patient profile:   77 year old female Height:      63 inches Weight:      127 pounds BMI:     22.58 Pulse rate:   83 / minute BP sitting:   160 /  80  (left arm)  Vitals Entered By: Laurance Flatten CMA (August 19, 2009 3:59 PM)  Physical Exam  General:  alert and well-developed., very nicely groomed, not ill appearing Head:  normocephalic and atraumatic.   Eyes:  vision grossly intact, pupils equal, and pupils round.   Mouth:  no gingival abnormalities and pharynx pink and moist.   Neck:  supple and no masses.   Chest Wall:  Well healed PPM incision. Lungs:  normal respiratory effort and normal breath sounds.   Heart:  normal rate and regular rhythm.   Abdomen:  soft, non-tender, and normal bowel sounds.   Msk:  no right temple tender or swelling;  right TMJ area wih crepitus and decrased ROM but no pain elicited on palpation or active motion per pt;   Pulses:  pulses normal in all 4 extremities Extremities:  no edema, no erythema  Neurologic:  alert & oriented X3 and cranial nerves II-XII intact.     PPM Specifications Following MD:  Alexis Bunting, MD     PPM Vendor:  [employer]     PPM Model Number:  [id number]     PPM Serial Number:  [id number] PPM DOI:  09/05/2008     PPM Implanting MD:  Alexis Bunting, MD  Lead 1    Location: RA     DOI: 05/13/2000     Model #: [id number]     Serial #: [id number]     Status: active  Lead 2    Location: RV     DOI: 05/13/2000     Model #: [id number]     Serial #: [id number]     Status: active  Magnet Response Rate:  BOL 85 ERI 65  Indications:  Alexis Cobb   PPM Follow Up Remote Check?  No Battery Voltage:  2.79 V     Battery Est. Longevity:  10 years     Pacer Dependent:  No       PPM Device Measurements Atrium  Amplitude: 2.0 mV, Impedance: 380 ohms, Threshold: 0.5 V at 0.4 msec Right Ventricle  Amplitude: 4.0 mV, Impedance: 389 ohms, Threshold: 0.875 V at 0.4 msec  Episodes MS Episodes:  2     Percent Mode Switch:  <0.1%     Coumadin:  No Ventricular High Rate:  0     Atrial Pacing:  87.7%     Ventricular Pacing:  30.1%  Parameters Mode:  DDDR     Lower Rate Limit:  60     Upper Rate Limit:  130 Next  Remote Date:  11/20/2009     Next Cardiology Appt Due:  08/09/2010 Tech Comments:  No parameter changes.  Device function normal.  [facility] transmissions every 3 months.  ROV 1 year with Dr. Ladona Ridgel. Altha Harm, LPN  August 19, 2009 4:05 PM  MD Comments:  Agree with above.  Impression & Recommendations:  Problem # 1:  PACEMAKER, PERMANENT (ICD-V45.01) Her PPM is working normally.  She will be rechecked in several months.  Problem # 2:  HYPERTENSION (ICD-401.9) Her blood pressure is up today.  She admits to dietary indiscretion with sodium.  I have asked her to stop this.  I will see her  back in several months. Her updated medication list for this problem includes:    Metoprolol Succinate 50 Mg Tb24 (Metoprolol succinate) ..... One and a half by mouth daily    Ecotrin Low Strength 81 Mg Tbec (Aspirin) .Marland Kitchen... Take 1 tablet by mouth once a day    Lisinopril 10 Mg Tabs (Lisinopril) ..... One by mouth daily  Problem # 3:  HYPERLIPIDEMIA (ICD-272.4) A low fat diet is recommended. Her updated medication list for this problem includes:    Altoprev 60 Mg Tb24 (Lovastatin) .Marland Kitchen... 1 by mouth once daily  Patient Instructions: 1)  Your physician recommends that you schedule a follow-up appointment in: 12 months with Dr Ladona Ridgel

## 2010-06-11 NOTE — Letter (Signed)
Summary: Device-Delinquent Phone Journalist, newspaper, Main Office  [address]   [place], [place] [address]   Phone: [phone]  Fax: [phone]     May 30, 2010 MRN: [id number]   Alexis Cobb [address] [place], [place]  [address]   Dear Ms. [facility],  According to our records, you were scheduled for a device phone transmission on 05-28-2010.     We did not receive any results from this check.  If you transmitted on your scheduled day, please call us to help troubleshoot your system.  If you forgot to send your transmission, please send one upon receipt of this letter.  Thank you,   Architectural technologist [facility]

## 2010-06-11 NOTE — Progress Notes (Signed)
Summary: CALL REPORT _ NEG DVT  Phone Note From Other Clinic   Summary of Call: Vascular Lab prelim report: Negative for DVT. Leg is warm, red and tender to touch.  Initial call taken by: Lamar Sprinkles, CMA,  Oct 03, 2009 12:11 PM  Follow-up for Phone Call        noted, pt started on antibx yest Follow-up by: Corwin Levins MD,  Oct 03, 2009 12:58 PM

## 2010-06-11 NOTE — Consult Note (Signed)
Summary: [facility]  [facility]   Imported By: Lennie Odor 09/10/2009 09:01:58  _____________________________________________________________________  External Attachment:    Type:   Image     Comment:   External Document

## 2010-06-11 NOTE — Letter (Signed)
Summary: Custom - Lipid  [facility], Main Office  [address]   [place], [place] [address]   Phone: [phone]  Fax: [phone]     November 21, 2009 MRN: [id number]   Alexis Cobb [address] [place], [place]  [address]   Dear [name],  We have reviewed your cholesterol results.  They are as follows:     Total Cholesterol:    198 (Desirable: less than 200)       HDL  Cholesterol:     88.20  (Desirable: greater than 40 for men and 50 for women)       LDL Cholesterol:       90  (Desirable: less than 100 for low risk and less than 70 for moderate to high risk)       Triglycerides:       100.0  (Desirable: less than 150)  Our recommendations include:These numbers look good. Continue on the same medicine. Sodium, potassium, kidney and  Liver function are normal. Take care, Dr. Darel Hong.    Call our office at the number listed above if you have any questions.  Lowering your LDL cholesterol is important, but it is only one of a large number of "risk factors" that may indicate that you are at risk for heart disease, stroke or other complications of hardening of the arteries.  Other risk factors include:   A.  Cigarette Smoking* B.  High Blood Pressure* C.  Obesity* D.   Low HDL Cholesterol (see yours above)* E.   Diabetes Mellitus (higher risk if your is uncontrolled) F.  Family history of premature heart disease G.  Previous history of stroke or cardiovascular disease    *These are risk factors YOU HAVE CONTROL OVER.  For more information, visit .  There is now evidence that lowering the TOTAL CHOLESTEROL AND LDL CHOLESTEROL can reduce the risk of heart disease.  The American Heart Association recommends the following guidelines for the treatment of elevated cholesterol:  1.  If there is now current heart disease and less than two risk factors, TOTAL CHOLESTEROL should be less than 200 and LDL CHOLESTEROL should be less than  100. 2.  If there is current heart disease or two or more risk factors, TOTAL CHOLESTEROL should be less than 200 and LDL CHOLESTEROL should be less than 70.  A diet low in cholesterol, saturated fat, and calories is the cornerstone of treatment for elevated cholesterol.  Cessation of smoking and exercise are also important in the management of elevated cholesterol and preventing vascular disease.  Studies have shown that 30 to 60 minutes of physical activity most days can help lower blood pressure, lower cholesterol, and keep your weight at a healthy level.  Drug therapy is used when cholesterol levels do not respond to therapeutic lifestyle changes (smoking cessation, diet, and exercise) and remains unacceptably high.  If medication is started, it is important to have you levels checked periodically to evaluate the need for further treatment options.  Thank you,    [employer] Team

## 2010-06-11 NOTE — Letter (Signed)
Summary: [facility]   Imported By: Sherian Rein 02/16/2010 10:53:35  _____________________________________________________________________  External Attachment:    Type:   Image     Comment:   External Document

## 2010-06-11 NOTE — Letter (Signed)
Summary: [facility]   Imported By: Sherian Rein 07/28/2009 14:43:10  _____________________________________________________________________  External Attachment:    Type:   Image     Comment:   External Document

## 2010-06-11 NOTE — Consult Note (Signed)
Summary: [facility]  [facility]   Imported By: Sherian Rein 08/07/2009 11:25:54  _____________________________________________________________________  External Attachment:    Type:   Image     Comment:   External Document

## 2010-06-11 NOTE — Assessment & Plan Note (Signed)
Summary: [facility]   Primary Provider:  Corwin Levins MD  CC:  sob.  History of Present Illness: Alexis Cobb is a very pleasant 77 year old female who has a history of mitral valve repair secondary to mitral regurgitation.  She has also had a previous pacemaker placed and history of nonobstructive coronary disease by catheterization in 2003. She had an echocardiogram performed in Sept 2009  that showed normal LV function.  She is status post mitral valve repair and there was mild mitral regurgitation  She also had a Myoview performed on January 16, 2008.  Her ejection fraction of 78% and there was no ischemia or infarction.  I last saw her in September of 2009. Since then the patient has dyspnea with more extreme activities but not with routine activities. It is relieved with rest. It is not associated with chest pain. There is no orthopnea, PND or pedal edema. There is no syncope or palpitations. There is no exertional chest pain.   Current Medications (verified): 1)  Metoprolol Succinate 50 Mg Tb24 (Metoprolol Succinate) .... One and A Half By Mouth Daily 2)  Ecotrin Low Strength 81 Mg  Tbec (Aspirin) .... Take 1 Tablet By Mouth Once A Day 3)  Lisinopril 10 Mg Tabs (Lisinopril) .... One By Mouth Daily 4)  Calcium 500 Mg Tabs (Calcium Carbonate) .... 2 By Mouth Daily 5)  Cerovite Silver  Tabs (Multiple Vitamins-Minerals) .... One By Mouth Daily 6)  Cleocin 300 Mg Caps (Clindamycin Hcl) .... 2 By Mouth Before Dental Procedures As Needed 7)  Vitamin D 1000 Unit Tabs (Cholecalciferol) .... One By Mouth Daily 8)  Mevacor 40 Mg Tabs (Lovastatin) .Marland Kitchen.. 1 By Mouth Once Daily 9)  Prednisone 5 Mg Tabs (Prednisone) .Marland Kitchen.. 1  Tab By Mouth Once Daily 10)  Furosemide 20 Mg Tabs (Furosemide) .... Take One Tablet By Mouth Daily.  Allergies: 1)  ! Penicillin 2)  ! Keflex 3)  ! * Actonel  Past History:  Past Medical History: Reviewed history from 11/28/2007 and no changes  required. Hyperlipidemia Hypertension Arthritis-Rheumatoid carotid sinus hypersensitivity Pacemaker, Permanent Hx of Mitral Regurgitation Raynaud's Syndrome Osteoporosis Osteoarthritis- C-Spine and Shoulders hx of shingles 2005 hx of DJD c-spine and shoulders  Past Surgical History: Reviewed history from 11/28/2007 and no changes required. mitral valve repair Hysterectomy Appendectomy Tonsillectomy s/p benign ovary tumor s/p permanent pacemaker 2002  Social History: Reviewed history from 10/02/2009 and no changes required. 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 Remaining systems are negative.   Vital Signs:  Patient profile:   77 year old female Height:      64 inches Weight:      122 pounds BMI:     21.02 Pulse rate:   80 / minute Resp:     14 per minute BP sitting:   120 / 70  (left arm)  Vitals Entered By: Kem Parkinson (November 21, 2009 11:46 AM)  Physical Exam  General:  Well-developed well-nourished in no acute distress.  Skin is warm and dry.  HEENT is normal.  Neck is supple. No thyromegaly.  Chest is clear to auscultation with normal expansion.  Cardiovascular exam is regular rate and rhythm. 2/6 systolic murmur at the apex. Abdominal exam nontender or distended. No masses palpated. Extremities show no edema. neuro grossly intact    EKG  Procedure date:  11/21/2009  Findings:      AV sequential pacemaker.  PPM Specifications Following MD:  Lewayne Bunting, MD     PPM Vendor:  Medtronic     PPM Model Number:  [id number]     PPM Serial Number:  [id number] PPM DOI:  09/05/2008     PPM Implanting MD:  Lewayne Bunting, MD  Lead 1    Location: RA     DOI: 05/13/2000     Model #: [id number]     Serial #: [id number]     Status:  active Lead 2    Location: RV     DOI: 05/13/2000     Model #: [id number]     Serial #: [id number]     Status: active  Magnet Response Rate:  BOL 85 ERI 65  Indications:  Huston Foley   PPM Follow Up Pacer Dependent:  No      Episodes Coumadin:  No  Parameters Mode:  DDDR     Lower Rate Limit:  60     Upper Rate Limit:  130  Impression & Recommendations:  Problem # 1:  MITRAL REGURGITATION (ICD-396.3) Status post mitral valve repair. Repeat echocardiogram. Continued SBE prophylaxis. Orders: Echocardiogram (Echo)  Problem # 2:  PACEMAKER, PERMANENT (ICD-V45.01) Magement per EP.  Problem # 3:  HYPERLIPIDEMIA (ICD-272.4) Continue statin. Check lipids and liver. The following medications were removed from the medication list:    Altoprev 60 Mg Tb24 (Lovastatin) .Marland Kitchen... 1 by mouth once daily Her updated medication list for this problem includes:    Mevacor 40 Mg Tabs (Lovastatin) .Marland Kitchen... 1 by mouth once daily  Orders: TLB-Lipid Panel (80061-LIPID) TLB-Hepatic/Liver Function Pnl (80076-HEPATIC) TLB-BMP (Basic Metabolic Panel-BMET) (80048-METABOL)  Problem # 4:  HYPERTENSION (ICD-401.9) Blood pressure controlled on present medications. Will continue. Check renal function and potassium. Her updated medication list for this problem includes:    Metoprolol Succinate 50 Mg Tb24 (Metoprolol succinate) ..... One and a half by mouth daily    Ecotrin Low Strength 81 Mg Tbec (Aspirin) .Marland Kitchen... Take 1 tablet by mouth once a day    Lisinopril 10 Mg Tabs (Lisinopril) ..... One by mouth daily    Furosemide 20 Mg Tabs (Furosemide) .Marland Kitchen... Take one tablet by mouth daily.  Problem # 5:  ARTHRITIS, RHEUMATOID, HX OF (ICD-V12.2)  Patient Instructions: 1)  Your physician recommends that you schedule a follow-up appointment in: ONE YEAR 2)  Your physician has requested that you have an echocardiogram.  Echocardiography is a painless test that uses sound waves to create images of your heart. It provides your doctor  with information about the size and shape of your heart and how well your heart's chambers and valves are working.  This procedure takes approximately one hour. There are no restrictions for this procedure.

## 2010-06-11 NOTE — Cardiovascular Report (Signed)
Summary: Office Visit Remote   Office Visit Remote   Imported By: Roderic Ovens 11/27/2009 10:53:13  _____________________________________________________________________  External Attachment:    Type:   Image     Comment:   External Document

## 2010-06-11 NOTE — Letter (Signed)
Summary: [facility]   Imported By: Lester [place] 11/07/2009 09:51:58  _____________________________________________________________________  External Attachment:    Type:   Image     Comment:   External Document

## 2010-06-11 NOTE — Progress Notes (Signed)
Summary: Medication change  Phone Note From Pharmacy   Caller: [facility]* Summary of Call: Patient requesting to change to generic Mevacor since ALtoprev no longer covered. Initial call taken by: Scharlene Gloss,  October 16, 2009 4:37 PM  Follow-up for Phone Call        ok - to robin to handle -  mevacor 40 mg per day Follow-up by: Corwin Levins MD,  October 16, 2009 5:22 PM    New/Updated Medications: MEVACOR 40 MG TABS (LOVASTATIN) 1 by mouth once daily Prescriptions: MEVACOR 40 MG TABS (LOVASTATIN) 1 by mouth once daily  #30 x 11   Entered by:   Scharlene Gloss   Authorized by:   Corwin Levins MD   Signed by:   Scharlene Gloss on 10/17/2009   Method used:   Faxed to ...       [facility]* (retail)       [address]       [place], [place]  [id number]       Ph: [phone]       Fax: [phone]   RxID:   [id number]

## 2010-06-11 NOTE — Letter (Signed)
Summary: [facility]   Imported By: Sherian Rein 12/05/2009 11:51:34  _____________________________________________________________________  External Attachment:    Type:   Image     Comment:   External Document

## 2010-06-19 ENCOUNTER — Encounter (INDEPENDENT_AMBULATORY_CARE_PROVIDER_SITE_OTHER): Payer: Self-pay | Admitting: *Deleted

## 2010-06-25 NOTE — Letter (Signed)
Summary: Remote Device Check  [facility], Main Office  [address]   [place], [place] [address]   Phone: [phone]  Fax: [phone]     June 19, 2010 MRN: [id number]   Alexis Cobb [address] [place], [place]  [address]   Dear Ms. [facility],   Your remote transmission was recieved and reviewed by your physician.  All diagnostics were within normal limits for you.  _____Your next transmission is scheduled for:                       .  Please transmit at any time this day.  If you have a wireless device your transmission will be sent automatically.  __X____Your next office visit is scheduled for: April 2012 with Dr. Graciela Husbands.                               Marland Kitchen Please call our office to schedule an appointment.    Sincerely,  Altha Harm, LPN

## 2010-07-01 NOTE — Cardiovascular Report (Signed)
Summary: Office Visit Remote   Office Visit Remote   Imported By: Roderic Ovens 06/22/2010 10:08:00  _____________________________________________________________________  External Attachment:    Type:   Image     Comment:   External Document

## 2010-09-18 ENCOUNTER — Encounter: Payer: Self-pay | Admitting: Internal Medicine

## 2010-09-19 ENCOUNTER — Encounter: Payer: Self-pay | Admitting: *Deleted

## 2010-09-22 ENCOUNTER — Encounter: Payer: Self-pay | Admitting: Internal Medicine

## 2010-09-22 ENCOUNTER — Ambulatory Visit (INDEPENDENT_AMBULATORY_CARE_PROVIDER_SITE_OTHER): Payer: Medicare Other | Admitting: Internal Medicine

## 2010-09-22 DIAGNOSIS — I498 Other specified cardiac arrhythmias: Secondary | ICD-10-CM

## 2010-09-22 DIAGNOSIS — I10 Essential (primary) hypertension: Secondary | ICD-10-CM

## 2010-09-22 DIAGNOSIS — Z950 Presence of cardiac pacemaker: Secondary | ICD-10-CM

## 2010-09-22 DIAGNOSIS — R001 Bradycardia, unspecified: Secondary | ICD-10-CM

## 2010-09-22 NOTE — Assessment & Plan Note (Signed)
 HEALTHCARE                         ELECTROPHYSIOLOGY OFFICE NOTE   Alexis, Cobb                  MRN:          [id number]  DATE:09/08/2006                            DOB:          1924-11-07    Alexis Cobb was seen today in the clinic on Sep 08, 2006, for  followup of her Medtronic.  Model number is [id number].  Date of implant  was May 13, 2000, for syncope.   On interrogation of her device today:  Her battery voltage is 2.71 with  an estimated longevity of 2.5 years.  P waves measured 2 to 2.8-millivolts with an atrial capture threshold of  0.5 volts at 0.4 milliseconds and an atrial lead impedance of 428 ohms.  R waves measured 5.6 to 8.6 millivolts with a ventricular capture  threshold of 1 volt at 0.4 milliseconds and a ventricular lead impedance  of 395 ohms.  There was one mode switch episode noted and 21 high  ventricular rates noted.  Her ventricular pacing is 30% of the time.  No  changes were made in her parameters.   She will continue with her [facility] transmissions and a return office  visit in one year's time.      Altha Harm, LPN  Electronically Signed      Duke Salvia, MD, [facility]  Electronically Signed   PO/MedQ  DD: 09/08/2006  DT: 09/08/2006  Job #: [id number]

## 2010-09-22 NOTE — Discharge Summary (Signed)
Alexis Cobb, Alexis Cobb NO.:  [id number]   MEDICAL RECORD NO.:  [id number]          PATIENT TYPE:  INP   LOCATION:  2926                         FACILITY:  [facility]   PHYSICIAN:  Alexis Frieze. Jens Som, MD, FACCDATE OF BIRTH:  October 29, 1924   DATE OF ADMISSION:  01/09/2008  DATE OF DISCHARGE:  01/11/2008                               DISCHARGE SUMMARY   PRIMARY CARDIOLOGIST:  Alexis Frieze. Jens Som, MD, [facility]   PRIMARY CARE PHYSICIAN:  Corwin Levins, MD   PROCEDURES PERFORMED DURING HOSPITALIZATION:  1. Pacemaker interrogation, this is a Kappa [id number] implanted on      May 14, 2007.  The patient had no changes made to her system and      it was found to be functioning appropriately.  2. Echocardiogram was completed during hospitalization.   FINAL DISCHARGE DIAGNOSES:  1. Nonobstructive coronary artery disease.      a.     July 2003 cardiac catheterization with 70% septal perforator       lesion, otherwise normal coronary arteries with an ejection       fraction of 70%, 4+ mitral regurgitation.  2. Severe mitral regurgitation with mitral valve prolapse.      a.     November 14, 2001, status post mitral valve repair with a #26 [address]       Jude Seguin ring along with chordal transplant.      b.     November 22, 2005, 2-D echocardiogram with an ejection fraction       of 50%-65% with no regional wall motion abnormalities and no       mitral stenosis and trivial mitral regurgitation.  3. Sick sinus syndrome.      a.     Status post Medtronic Kappa dual-chamber permanent pacemaker       January 2002.      b.     Status post pacemaker interrogation.  The patient's       pacemaker found to be functioning appropriately, estimated long as       11 months.  4. Hypertension.  5. Hyperlipidemia.  6. 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  7. Osteoarthritis.   HOSPITAL COURSE:  An 77 year old Caucasian female with above-mentioned  diagnoses, who presented to  the emergency room today with complaints of  dyspnea on exertion, also is becoming worse and resolves usually with a  couple of minutes of rest.  The patient also had complaints of  generalized malaise with intermittent lightheadedness and dizziness  specifically with position changes.  The patient was also complaining of  palpitations which occurred after eating her evening meal.  She felt  like a blanket was over her chest and she began to have trouble  breathing.  The patient came to the emergency room and blood pressures  found to be elevated at 180/80 with a heart rate of 80.  The patient was  found to have increased blood pressure throughout evaluation in the ER  with a blood pressure 199/91.  Since that time, the patient was admitted  to rule  out myocardial infarction for better blood pressure control and  evaluation of pacemaker.   The patient was seen and followed by Dr. Olga Millers throughout  hospitalization.  The patient's blood pressure normalized with increase  of Toprol to 50 mg 1-1/2 tablets a day and lisinopril 10 mg daily.  The  patient's cardiac enzymes were found to be negative.  Echocardiogram  revealed normal LV function with an EF of 50%-60% with no diagnostic  evidence of left ventricular wall motion abnormalities.  Please see  echocardiogram report for more details.   On day following the echocardiogram, the patient was seen and evaluated  by Dr. Olga Millers and found to be stable for discharge.  Blood  pressure was stable.  There was no further complaints of palpitations or  chest pain.  The patient will have increase in her Toprol, lisinopril,  and addition of potassium at home.  The patient will follow up in the  office with an outpatient stress Myoview and BMET in 1 week and follow  up with Dr. Jens Cobb in 2 weeks.   DISCHARGE LABS:  Sodium 141, potassium 3.3, chloride 107, CO2 26, BUN  16, creatinine 1.04, glucose 83.  Cardiac enzymes were found to  be  negative, less than 0.01 and 0.01 respectively.  Magnesium 1.9,  cholesterol 134, lipids 91, HDL 44, LDL 72, BNP 199.0, hemoglobin 12.9,  hematocrit 39.0, white blood cells 6.7, platelets 234.  EKG revealing  normal sinus rhythm.   DISCHARGE MEDICATIONS:  1. Toprol-XL 50 mg 1-1/2 tablets daily (increased from prior dose).  2. Altoprev 60 mg daily.  3. Aspirin 81 mg daily.  4. Lasix 20 mg daily.  5. Multivitamin one daily.  6. Calcium 600 mg daily.  7. Vitamin D 1000 units daily.  8. Forteo injection daily.  9. Nitroglycerin 0.4 mg as needed for chest pain.  10.K-Dur 20 mEq daily, new prescription provided.  11.Lisinopril 10 mg daily, new prescription provided.   FOLLOWUP PLANS AND APPOINTMENTS:  1. The patient will follow with Dr. Olga Millers on January 26, 2008, at 2:45.  2. The patient has been scheduled for pharmacologic stress test on      January 16, 2008, at 12:30.  3. The patient will follow with Dr. Oliver Barre as scheduled.  4. Discussion of new prescriptions and new dosing for current      medication regimen has been had with the patient with prescriptions      provided.   Time spent with the patient to include physician time 35 minutes.      Bettey Mare. Lyman Bishop, NP      Alexis Frieze. Jens Som, MD, [facility]  Electronically Signed    KML/MEDQ  D:  01/11/2008  T:  01/12/2008  Job:  [id number]   cc:   Corwin Levins, MD

## 2010-09-22 NOTE — Assessment & Plan Note (Signed)
Her blood pressure remains well controlled. She will continue her current medications.

## 2010-09-22 NOTE — Assessment & Plan Note (Signed)
[facility]                         ELECTROPHYSIOLOGY OFFICE NOTE   JEANETTE, RAUTH                  MRN:          [id number]  DATE:08/29/2008                            DOB:          11-18-1924    HISTORY OF PRESENT ILLNESS:  Ms. Armenteros returns today for followup.  She is a very pleasant woman with a history of symptomatic bradycardia  who is status post pacemaker insertion with a Medtronic Kappa 700 placed  in January 2002 for syncope.  She has been stable, but has subsequently  reached elective replacement indication on her pacemaker and returns  today for followup.   CURRENT MEDICATIONS:  Include  1. Multivitamin.  2. Aspirin 81 a day.  3. Calcium supplements.  4. Lisinopril 10 a day.  5. Toprol-XL 50 mg one and half daily.  6. Furosemide 60 a day.  7. Potassium 20 mEq daily.   PHYSICAL EXAMINATION:  GENERAL:  She is a pleasant elderly woman in no  acute distress.  VITAL SIGNS:  The blood pressure was 125/68, pulse 65 and regular,  respirations were 18, and the weight was 124 pounds.  NECK:  Revealed no jugular venous distention.  LUNGS:  Clear bilaterally to auscultation.  No wheezes, rales, or  rhonchi are present.  There is no increased work of breathing.  CARDIOVASCULAR:  Revealed a regular rate and rhythm.  Normal S1 and S2.  ABDOMEN:  Soft and nontender.  EXTREMITIES:  Demonstrated trace edema.   Interrogation of her pacemaker demonstrates a Medtronic Kappa 700, which  reached ERI on August 07, 2008 and the patient's impedances were stable.  Her lower rate was at 65.  She was V pacing 90% of the time.   IMPRESSION:  1. Symptomatic bradycardia.  2. Status post pacemaker insertion.  3. History of hypertension.   DISCUSSION:  I have discussed treatment options with Ms. Gouin.  The risks, benefits, goals expectations of pacemaker generator change  have been discussed with the patient and she wishes to proceed.   This  will be scheduled at earliest possible convenient time.     Doylene Canning. Ladona Ridgel, MD  Electronically Signed    GWT/MedQ  DD: 08/29/2008  DT: 08/30/2008  Job #: [id number]

## 2010-09-22 NOTE — Assessment & Plan Note (Signed)
[facility]                         ELECTROPHYSIOLOGY OFFICE NOTE   Alexis Cobb, Alexis Cobb                  MRN:          [id number]  DATE:09/18/2007                            DOB:          1924/06/15    Alexis Cobb returns today for follow-up.  She is a very pleasant  elderly woman with a history of symptomatic bradycardia who is status  post pacemaker insertion back in 2002.  At that time she had a Medtronic  Kappa device placed.  She returns today for follow-up.  About a year  ago, she had severe problems with arthritis.  This is improved.  She is  off of her steroids.  She had no other specific complaints today except  that since she has been feeling better, she has gained approximately 5  pounds.   MEDICATIONS:  1. Toprol XL 50 a day.  2. Multiple vitamins.  3. Aspirin 81 mg a day.  4. Vitamin D.   PHYSICAL EXAMINATION:  GENERAL APPEARANCE:  She is a pleasant elderly  woman in no distress.  VITAL SIGNS:  Blood pressure was 160/85, respirations were 18, the  weight was 126 pounds and pulse was 88 per minute.  NECK:  No jugular venous distension.  LUNGS:  Clear bilaterally to auscultation.  No wheezes, rales or rhonchi  are present.  There is no increased work of breathing.  CARDIOVASCULAR:  Exam revealed a regular rate and rhythm with normal S1  and S2.  There were no murmurs, rubs or gallops that I could appreciate.  EXTREMITIES:  No cyanosis, clubbing or edema.  ABDOMEN:  Soft, nontender, nondistended.   Interrogation of her pacemaker demonstrates a Medtronic Kappa 700.  P-  waves were 2 and R-waves were 5, the impedance 430 in the A, 395 in the  V and the threshold 0.5 at 0.4 in the A and 1 at 0.4 in the V.  Battery  voltage was 2.67 volts.  She was pacing approximately 65% of the time.   IMPRESSION:  1. Symptomatic bradycardia.  2. Status post pacemaker insertion with her pacemaker approaching      elective replacement  indicator but not there yet.  3. Arthritis.   DISCUSSION:  Alexis Cobb is stable.  Her pacemaker is working  normally.  Her bradycardia is well-controlled.  Will plan to see her  back in the office for pacemaker follow-up in one year.  She will follow  up in our [facility] program as well.     Doylene Canning. Ladona Ridgel, MD  Electronically Signed    GWT/MedQ  DD: 09/18/2007  DT: 09/18/2007  Job #: [id number]

## 2010-09-22 NOTE — Assessment & Plan Note (Signed)
[facility]ALTHCARE                            CARDIOLOGY OFFICE NOTE   Alexis Cobb, Alexis Cobb                  MRN:          [id number]  DATE:01/26/2008                            DOB:          December 17, 1924    Alexis Cobb is a very pleasant 77 year old female who has a history  of mitral valve repair secondary to mitral regurgitation.  She has also  had a previous pacemaker placed and history of nonobstructive coronary  disease by catheterization in 2003.  She was recently admitted to [facility] with complaints of dyspnea and elevated blood pressure.  She also had some mild chest discomfort.  Her blood pressure medications  were adjusted by increasing her Toprol and her lisinopril.  She had an  echocardiogram performed during that admission that showed normal LV  function.  She is status post mitral valve repair and there was mild  mitral regurgitation.  The patient felt improved and was discharged home  on the additional medications.  Note, her potassium was mildly decreased  during the admission at 3.3.  She also had an outpatient Myoview  performed.  This was on January 16, 2008.  Her ejection fraction of 78%  and there was no ischemia or infarction.  Since then, she feels much  better.  She has not had chest pain, shortness of breath, palpitations,  or syncope, and there is no pedal edema.   MEDICATIONS AT PRESENT:  Include,  1. Toprol 50 mg p.o. daily.  2. Multivitamin daily.  3. Aspirin 81 mg p.o. daily.  4. Altoprev 60 mg p.o. daily.  5. Calcium.  6. Vitamin D 40.  7. Lasix 20 mg p.o. daily.  8. Potassium 20 mEq p.o. daily.  9. Potassium 20 mEq p.o. daily.  10.Lisinopril 20 mg p.o. daily.   PHYSICAL EXAMINATION:  VITAL SIGNS:  Today shows a blood pressure of  142/72 and her pulse is 80.  She weighs 119 pounds.  HEENT:  Normal.  NECK:  Supple.  CHEST:  Clear.  CARDIOVASCULAR:  Regular rate and rhythm.  ABDOMEN:  No  tenderness.  EXTREMITIES:  No edema.   Her electrocardiogram shows AV pacing.   DIAGNOSES:  1. History of mitral valve repair - the patient's follow-up      echocardiogram showed only mild mitral regurgitation and she has      normal left ventricular function.  She will continue with SBE      prophylaxis.  2. History of pacemaker placement - she will follow-up with electro      physiologist as scheduled.  3. Hypertension - her blood pressure has improved.  4. Recent hypokalemia - we will check a BMET today and adjust her      regimen as indicated.  5. Hyperlipidemia - she will continue Altoprev.  6. Rheumatoid arthritis.   I will see her back in 9 months.     [name] Jens Som, MD, [facility]  Electronically Signed    BSC/MedQ  DD: 01/26/2008  DT: 01/27/2008  Job #: [id number]

## 2010-09-22 NOTE — Progress Notes (Signed)
HPI [name] returns today for followup. She is a pleasant elderly woman with a history of symptomatic bradycardia, status post pacemaker insertion. She has done well in the interim. She denies chest pain or shortness of breath. She thinks that she may have lost a couple of pounds. No syncope. Allergies  Allergen Reactions  . Cephalexin   . Penicillins   . Risedronate Sodium      Current Outpatient Prescriptions  Medication Sig Dispense Refill  . aspirin 81 MG tablet Take 81 mg by mouth daily.        . Calcium Carbonate-Vitamin D (CALCIUM + D PO) Take by mouth daily.        . Cholecalciferol (VITAMIN D) 1000 UNITS capsule Take 1,000 Units by mouth daily.        . clindamycin (CLEOCIN) 300 MG capsule Take by mouth. Take as directed for dental procedures as needed      . furosemide (LASIX) 20 MG tablet Take 20 mg by mouth daily.        Marland Kitchen lovastatin (MEVACOR) 40 MG tablet Take 40 mg by mouth at bedtime.        . metoprolol (TOPROL-XL) 50 MG 24 hr tablet 1 1/2 tab po qd       . Multiple Vitamins-Minerals (CEROVITE PO) 1 tab po qd       . lisinopril (PRINIVIL,ZESTRIL) 10 MG tablet Take 10 mg by mouth daily.        Marland Kitchen DISCONTD: calcium carbonate 200 MG capsule        . DISCONTD: calcium gluconate 500 MG tablet 2 tabs po qd       . DISCONTD: predniSONE (DELTASONE) 5 MG tablet Take 5 mg by mouth daily.           Past Medical History  Diagnosis Date  . Hyperlipidemia   . HTN (hypertension)   . Arthritis   . Hypersensitivity   . S/P placement of cardiac pacemaker     ROS:   All systems reviewed and negative except as noted in the HPI.   No past surgical history on file.   Family History  Problem Relation Age of Onset  . Coronary artery disease    . Hypertension       History   Social History  . Marital Status: Widowed    Spouse Name: N/A    Number of Children: N/A  . Years of Education: N/A   Occupational History  . Not on file.   Social History Main Topics    . Smoking status: Never Smoker   . Smokeless tobacco: Not on file  . Alcohol Use: No  . Drug Use: No  . Sexually Active: Not on file   Other Topics Concern  . Not on file   Social History Narrative  . No narrative on file     BP 110/70  Pulse 67  Ht 5\' 4"  (1.626 m)  Wt 119 lb (53.978 kg)  BMI 20.43 kg/m2  Physical Exam:  Elderly, Well appearing NAD HEENT: Unremarkable Neck:  No JVD, no thyromegally Lymphatics:  No adenopathy Back:  No CVA tenderness Lungs:  Clear. Well-healed pacemaker incision HEART:  Regular rate rhythm, no murmurs, no rubs, no clicks Abd:  Flat, positive bowel sounds, no organomegally, no rebound, no guarding Ext:  2 plus pulses, no edema, no cyanosis, no clubbing Skin:  No rashes no nodules Neuro:  CN II through XII intact, motor grossly intact  DEVICE  Normal device function.  See  PaceArt for details.   Assess/Plan:

## 2010-09-22 NOTE — Patient Instructions (Signed)
Your physician wants you to follow-up in: 12 months with Dr. Taylor. You will receive a reminder letter in the mail two months in advance. If you don't receive a letter, please call our office to schedule the follow-up appointment.    

## 2010-09-22 NOTE — Op Note (Signed)
Alexis, Cobb NO.:  [id number]   MEDICAL RECORD NO.:  [id number]          PATIENT TYPE:  INP   LOCATION:  2899                         FACILITY:  [facility]   PHYSICIAN:  Doylene Canning. Ladona Ridgel, MD    DATE OF BIRTH:  1925-03-19   DATE OF PROCEDURE:  09/05/2008  DATE OF DISCHARGE:  09/05/2008                               OPERATIVE REPORT   PROCEDURE PERFORMED:  Removal of previous implanted dual-chamber  pacemaker and followed by insertion of dual-chamber pacemaker.   INDICATIONS:  Pacemaker generator at ERI in a patient with symptomatic  bradycardia.   HISTORY:  The patient is an 77 year old woman with a history of  symptomatic bradycardia who underwent permanent pacemaker insertion in  2001.  She has subsequently reached her elective replacement indication  and is now referred for pacemaker removal and insertion of a new device.   PROCEDURE:  After informed consent was obtained, the patient was taken  to the Diagnostic EP Lab in a fasting state.  After usual preparation  and draping, intravenous fentanyl and midazolam was given for sedation.  Lidocaine 30 mL was infiltrated into the left infraclavicular region.  A  5-cm incision was carried out over this region.  Electrocautery was  utilized to dissect down the fascial plane.  The pacemaker pocket was  entered without difficulty utilizing electrocautery and the generator  was removed.  The atrial and ventricular leads were disconnected from  the generator and evaluated.  The atrial threshold was 2 volts at 0.5  milliseconds.  P-waves were 2 mV with a pace impedance of 320 ohms.  The  R-waves were approximately 4 mV with a pacing impedance of 375 ohms and  the threshold 0.9 volts at 0.3 milliseconds.  With these satisfactory  parameters, the new Medtronic Sensia dual-chamber pacemaker, serial  number [id number] was connected to the atrial and ventricular leads and  placed back in the subcutaneous pocket.  The  pocket was irrigated with  kanamycin and incision closed with 2-0 Vicryl and 3-0 Vicryl.  Benzoin  was painted on the skin.  Steri-Strips were applied and a pressure  dressing placed.  The patient was returned to her room in satisfactory  condition.   COMPLICATIONS:  No immediate procedure complications.   RESULTS:  Demonstrates successful removal and insertion of a Medtronic  permanent pacemaker in a patient with symptomatic bradycardia and  pacemaker which had reached ERI.      Doylene Canning. Ladona Ridgel, MD  Electronically Signed     GWT/MEDQ  D:  09/05/2008  T:  09/06/2008  Job:  [id number]

## 2010-09-22 NOTE — Assessment & Plan Note (Signed)
Her device is working normally. We'll recheck in several months. 

## 2010-09-22 NOTE — H&P (Signed)
NAMEZISSEL, BIEDERMAN NO.:  [id number]   MEDICAL RECORD NO.:  [id number]          PATIENT TYPE:  EMS   LOCATION:  MAJO                         FACILITY:  [facility]   PHYSICIAN:  Noralyn Pick. Eden Emms, MD, FACCDATE OF BIRTH:  October 22, 1924   DATE OF ADMISSION:  01/09/2008  DATE OF DISCHARGE:                              HISTORY & PHYSICAL   PRIMARY CARDIOLOGIST:  [name]. Jens Som, MD, [facility].   ELECTROPHYSIOLOGIST:  Doylene Canning. Ladona Ridgel, MD.   PRIMARY CARE Cleland Simkins:  Corwin Levins, MD.   PATIENT PROFILE:  This is an 77 year old Caucasian female with history  of mitral valve prolapse and severe MR status post mitral valve repair  in 2003 who presents with complaints of palpitations, chest pressure, 1  month history of dyspnea on exertion, and lightheadedness.   PROBLEMS:  1. Nonobstructive CAD.      a.     November 13, 2001, cardiac catheterization 7% septal perforator       lesion.  Otherwise, normal coronary arteries.  EF 70%, 4+ mitral       regurgitation.  2. Severe mitral regurgitation/mitral valve prolapse.      a.     November 14, 2001 status post mitral valve repair with #26 St.       Jude Seguin ring along with cordal transplant.      b.     November 22, 2005, 2D echocardiogram, EF 55-65%.  No regional       wall motion abnormalities.  No mitral stenosis and trivial MR.  3. Sick sinus syndrome.      a.     Status post Medtronic Kappa dual-chamber permanent pacemaker       January 2002.  4. Hypertension.  5. Hyperlipidemia.  6. 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  7. Osteoarthritis.   HISTORY OF PRESENT ILLNESS:  This is an 77 year old Caucasian female  with the above problem list who presents to the emergency room today  with several complaints.  She reports roughly a 1 month history of  dyspnea on exertion that she did not previously experience.  This occurs  with prolonged amounts of walking or taking inclines.  The dyspnea  usually  lasts a couple minutes and resolves when she sits down and  rests.  She has not had any chest pressure associated with dyspnea.   Over the past week, she has had some generalized malaise with  intermittent lightheadedness and dizziness specifically with position  changes.  This lightheadedness lasts about a minute or two and resolves  when she has a chance to steady her feet underneath her.  She is also  noted over the past week some posterior centralized neck throbbing pain.  She has had this in the past and is usually brought on by something that  she has done like turning in certain way or lifting something she should  not have.  This has been present over the past week, and she wears a  neck brace to stabilize this.  Last evening just after her evening meal  approximately 8:30, she  had sudden onset of what she felt was tachy  palpitations stating her heart was beating so hard.  This was  associated mild pressure which she described as like having a blanket  over her chest.  She checked her blood pressure since symptoms  continued, and her blood pressure was 180/80 with the heart rate of 80.  She laid down on the bed and noted mild dyspnea, but a continued mild  pressure in her chest as well as palpitations.  She was restless whole  night and called the office this morning.  She notes her blood pressure  ran high throughout the night.  The office advised that she present to  the ED.  Here, her blood pressure has been fluctuating in the 180s to  190s and is currently 199/91.  She does feel a little better since  arriving here.   HOME MEDICATIONS:  1. Altoprev 60 mg daily.  2. Aspirin 81 mg daily.  3. Lasix 20 mg daily.  4. Forteo injection daily.  5. Toprol-XL 50 mg daily.  6. Multivitamin daily.  7. Calcium 600 mg daily.  8. Vitamin D 1000 units daily.   FAMILY HISTORY:  Mother died of cancer at 64.  Father died of an MI at  27.  She has a brother who has CAD status post CABG.   She has another  brother with CAD status post stenting.   SOCIAL HISTORY:  She lives in [place] by herself.  She is retired.  She denies tobacco, alcohol, or drug use.  She is not very active,  although she does try to maintain some activity around her house.   REVIEW OF SYSTEMS:  Positive for dyspnea on exertion for the past 3 to 4  weeks.  This is new.  She has had presyncope with position changes over  the past week.  Tachy palpitations since last night.  She has had some  posterior neck throbbing over the past week.  This is a chronic problem.  Otherwise, all systems reviewed negative.   PHYSICAL EXAMINATION:  VITAL SIGNS:  Temperature 98.0, heart rate 81,  respirations 16, and blood pressure 183/93 to 199/91, and pulse oximetry  98% room air.  GENERAL:  Pleasant white female in no acute distress.  Awake, alert, and  oriented x3.  HEENT:  Normal.  Nares grossly intact, nonfocal.  SKIN:  Warm and dry without lesions or masses.  NECK:  No bruits or JVD.  LUNGS:  Respirations were unlabored.  Clear to auscultation.  CARDIAC:  Regular S1 and S2.  No S3 or S4 or murmurs.  ABDOMEN:  Round, soft, nontender, and nondistended.  Bowel sounds  present x4.  EXTREMITIES:  Warm, dry, and pink.  No clubbing, cyanosis, or edema.  Dorsalis pedis and posterior tibial pulses are 2+ and equal bilaterally.   LABORATORY DATA:  Chest x-ray is pending.  EKG shows an AV paced rhythm  at a rate of 79 beats per minute.  Left axis deviation, no acute ST-T  changes.  Lab work pending.   ASSESSMENT AND PLAN:  1. Hypertensive urgency.  Blood pressure running greater than 180      systolic since at least the last night.  She is compliant with her      medications and took medications this morning.  She also has very      mild chest pressure, mild dyspnea, and restlessness in association      with her hypertension.  Plan to admit cyclic cardiac  markers.  Add      IV nitro and continue aspirin,  beta-blocker.  We will add an ACE      inhibitor.  2. Dyspnea on exertion.  New for the past month or so.  Check cardiac      markers.  If enzymes are negative, we will plan outpatient Myoview.      We will also check 2D echocardiogram to reevaluate her valves and      EF which was previously normal in 2007.  3. Palpitations.  Her heart rate was apparently in the 80s during her      episode of palpitations last night.  Check lytes, TSH, and      magnesium.  We will interrogate a pacemaker.  4. Lightheadedness, question if she is orthostatic.  We will check      orthostatic pressures.  5. Chest pressure, see #2.  6. Hyperlipidemia.  Check lipids, LFTs.  Continue lovastatin.      Nicolasa Ducking, ANP      Noralyn Pick. Eden Emms, MD, [facility]  Electronically Signed    CB/MEDQ  D:  01/09/2008  T:  01/10/2008  Job:  [id number]

## 2010-09-24 ENCOUNTER — Other Ambulatory Visit: Payer: Self-pay | Admitting: *Deleted

## 2010-09-24 MED ORDER — METOPROLOL SUCCINATE ER 50 MG PO TB24: 75.0000 mg | ORAL_TABLET | Freq: Every day | ORAL | Status: DC

## 2010-09-25 NOTE — Procedures (Signed)
[facility]  Patient:    Alexis Cobb, Alexis Cobb Visit Number: [id number] MRN: [id number]          Service Type: SUR Location: 2300 [id number] Attending Physician:  Tressie Stalker Dictated by:   Shela Commons. Claybon Jabs, M.D. Proc. Date: 11/14/01 Admit Date:  11/13/2001                             Procedure Report  PROCEDURE:  Transesophageal echocardiogram.  SURGEON:  Halford Decamp, M.D.  DESCRIPTION OF PROCEDURE:  The patient is a 77 year old white female who presents to the operating room for mitral valve repair.  The surgeon requested transesophageal echocardiogram for the intraoperative management of this patient.  The patient underwent a routine cardiac induction, and following this, the transesophageal probe was carefully lubricated and inserted into the patients esophagus.  The patient had very small oral opening, and I was unable to insert the normal transesophageal mouth guard.  So, a substitute covering over the upper teeth was used, and we were unable to effectively protect the lower teeth or the probe from the lower teeth, so this was done with copious lubrication.  The overall images of the heart appeared normal. The right atrium had no evidence of masses or thrombus.  The left atrial septum was free from Doppler evidence of septal defect.  The tricuspid valve showed 1+ regurgitation with the pulmonary artery catheter crossing the valve. Overall structure of the valve appeared normal.  The right ventricle appeared to be functioning normally with good contractility.  The left atrium was slightly dilated, but showed no evidence of masses or thrombus.  The mitral valve had an obvious area which appeared to be in the central part of the A2 part of the anterior leaflet which was redundant and appeared to have a flailed chordae.  This chordae seemed to be going in and out of the atrium as the valve opened and closed.  There was a significant amount of  mitral regurgitation as well with the tract along the lateral wall.  I would call this 4+ regurgitation.  The pulmonary vein showed reversal in flow on the left, but I did not investigate the right pulmonary vein.  The left ventricle was slightly, but had good contractility and no segmental wall motion abnormalities.  The aortic valve had three leaflets and was normal in structure without regurgitation or stenosis.  The decision was made to repair the mitral valve and insert a ring.  This was done on bypass, and following completion of the repair of the valve, but prior to separation from the BiPAP machine, the valve was evaluated again which showed trace regurgitation with overall excellent results from the mitral valve repair.  The patient then was allowed to continue to rewarm.  Deairing procedures took place and the patient separated successfully from BiPAP. Further evaluation of the valve showed the ring to be in good position.  That there was trace regurgitation, but the valve seemed to open up adequately and function probably.  The surgery was concluded and the patient was taken to the intensive care unit in stable condition following removal of the probe.  The patient tolerated the procedure well. Dictated by:   Shela Commons. Claybon Jabs, M.D. Attending Physician:  Tressie Stalker DD:  11/14/01 TD:  11/16/01 Job: [id number]

## 2010-09-25 NOTE — Discharge Summary (Signed)
[place]  Patient:    Alexis Cobb, Alexis Cobb                  MRN: [id number] Adm. Date:  [id number] Disc. Date: 05/14/00 Attending:  Corwin Levins Dictator:   Cornell Barman, P.A.                           Discharge Summary  DISCHARGE DIAGNOSES: 1. Syncope. 2. Hypertension. 3. Upper respiratory infection.  HISTORY OF PRESENT ILLNESS:  Ms. Tuccillo is a 77 year old white female who was in her usual state of health when she had an unwitnessed syncopal episode. She states she had no presyncopal symptoms.  She does no recall falling or losing consciousness.  She denied any loss of control of her bowels or bladder.  PAST MEDICAL/SURGICAL HISTORY: 1. Hypertension. 2. Osteoporosis. 3. Labyrinthitis. 4. Status post tonsillectomy. 5. Status post adenoidectomy. 6. Status post appendectomy. 7. Benign ovarian tumor. 8. Status post total abdominal hysterectomy and bilateral    salpingo-oophorectomy four to five years ago.  LABORATORIES ON ADMISSION:  CBC was normal.  Coagulopathies were normal.  CMET was essentially normal except for a BUN of 25 and creatinine 1.1.  Cardiac enzymes were negative.  Urinalysis was negative.  Chest x-ray showed no acute abnormalities.  HOSPITAL COURSE:  SYNCOPE:  Patient was admitted to rule out neurocardiogenic etiology. Neurology was asked to see the patient.  Dr. Sharene Skeans saw the patient and recommended obtaining carotid Dopplers that showed no significant ICA stenosis bilaterally.  Her EEG was normal.  There was an MRI that was done in August 2001 and he did review this and felt there was no indication for further MRI workup.  He did not see any evidence of vertebrobasilar insufficiency or neurologic etiology for her syncope and no further neurologic workup was recommended.  We also had cardiology see the patient.  They recommended obtaining a 2-D echo.  This revealed an EF of 55% with mild hypokinesis of the  basal inferior/posterior wall.  At this point, they recommended a cardiac catheterization.  Patient had cath on May 12, 2000 that showed the left main, LAD, circumflex, and the RCA to all be normal.  At this point, the patient underwent an EP study and during patient had a 3.3 second pause.  At this time, Dr. Graciela Husbands decided to proceed with pacer implantation.  The patient was also ______ Asseure Trial.  Patient had a pacemaker implanted on May 13, 2000.  The patient did have evidence of syncope consistent with carotid sinus hypersensitivity.  The plan at this time since the patient has no pneumothorax following pacemaker she can be discharged on May 14, 2000 in the morning.  MEDICATIONS AT DISCHARGE:  Zithromax 250 mg q.d. for one day, aspirin 325 mg q.d.  We will resume the Prinzide 10/12.5 mg q.d. and at this time she is instructed not to take the Toprol XL.  FOLLOWUP:  Follow up with her primary physician in two to three weeks. DD:  05/13/00 TD:  05/13/00 Job: [id number]

## 2010-09-25 NOTE — Assessment & Plan Note (Signed)
[facility]ALTHC[facility]                              CARDIOLOGY OFFICE NOTE   Alexis, Cobb                  MRN:          [id number]  DATE:01/19/2006                            DOB:          1924-11-24    [name] returns for followup today.  Please refer to my previous  notes for details.  Since I last saw her, her pedal edema has improved with  the interventions of Lasix 20 mg p.o. daily, compression stockings and feet  elevation.  She has not had shortness of breath or chest pain.  She is  having some problems with arthralgias and apparently a sed rate has been  extremely high (greater than 100) and she has been referred to Rheumatology.  She otherwise has not had any complaints other than fatigue.  Note, her TSH  was normal.   MEDICATIONS:  1. Toprol 25 mg p.o. daily.  2. Aspirin 81 mg p.o. daily.  3. Multivitamin.  4. Altoprev 60 mg p.o. daily.  5. Lasix 20 mg p.o. daily.   PHYSICAL EXAM:  VITAL SIGNS:  Blood pressure 145/79 and her pulse is 88.  She weighs 112 pounds.  CHEST:  Clear.  CARDIOVASCULAR:  Exam reveals a regular rate and rhythm.  EXTREMITIES:  Trace edema.   DIAGNOSES:  1. Pedal edema, improving.  2. History of mitral valve repair.  3. History of pacemaker.  4. Hypertension.  5. Hyperlipidemia.   PLAN:  [name] has improved with low-dose diuretics, compression  stocking and feet elevation.  We will continue with her present medications.  She is scheduled to see the rheumatologist for her increased sed rate and  arthralgias.  We will see her back in 4 months.                              [name] Jens Som, MD, [facility]    BSC/MedQ  DD:  01/19/2006  DT:  01/20/2006  Job #:  [id number]

## 2010-09-25 NOTE — Assessment & Plan Note (Signed)
[facility]ALTH[facility]                            CARDIOLOGY OFFICE NOTE   Alexis Cobb, Alexis Cobb                  MRN:          [id number]  DATE:07/26/2006                            DOB:          March 13, 1925    [name] returns in followup today.  Please refer to my previous  notes for details.  Since I last saw her, she has seen a rheumatologist  and apparently potentially has rheumatoid arthritis and is now on  steroids.  Her pedal edema has improved.  There is no chest pain or  dyspnea, and there is no syncope.   MEDICATIONS:  1. Toprol 25 mg p.o. daily.  2. Multivitamins 1 p.o. daily.  3. Aspirin 81 mg p.o. daily.  4. Actiprep 60 mg p.o. daily.  5. Lasix 20 mg p.o. daily.  6. Prednisone 3 mg p.o. daily.  7. Calcium and vitamin D.   PHYSICAL EXAMINATION:  VITAL SIGNS: Blood pressure 142/80, pulse 80.  She weighs 121 pounds.  CHEST:  Clear.  CARDIOVASCULAR:  Regular.  There is no murmur noted.  EXTREMITIES:  Show no edema.   Her electrocardiogram shows atrial and ventricular pacing.   DIAGNOSES:  1. History of mitral valve repair.  2. History of pacemaker.  3. Hypertension.  4. Hyperlipidemia.  5. Probable rheumatoid arthritis.   PLAN:  [name] is doing well from a symptomatic standpoint.  We  will continue with her present medications.  I will check a BMET today  to follow her potassium and renal function as well as lipids and liver.  She will see [place] back in 6 months.     Alexis Frieze Jens Som, MD, [facility]  Electronically Signed    BSC/MedQ  DD: 07/26/2006  DT: 07/27/2006  Job #: [id number]

## 2010-09-25 NOTE — Consult Note (Signed)
[place]. [facility]  Patient:    Alexis Cobb, Alexis Cobb                  MRN: [id number] Proc. Date: 05/10/00 Adm. Date:  [id number] Attending:  Corwin Levins CC:         Corwin Levins, M.D. [facility]   Consultation Report  CHIEF COMPLAINT:  Passing out.  HISTORY OF PRESENT ILLNESS:  I had the pleasure of seeing Alexis Cobb, a 77 year old right-handed Caucasian woman who had an unwitnessed syncopal spell which was her second since November of 2001.  The patient had a regular sized meal.  She had gotten up to clear the dishes and was putting away some of the food, when she suddenly lost consciousness. The patient found herself on the floor of her kitchen. She does not know for certain how long she was out but knows that it was a quarter of 6 when she came out into the kitchen and remembers seeing the clock at 8 minutes to 6 when she finally got up and herself composed.  She does not remember falling or striking the ground. She did not hurt herself in the fall.  She did not lose control of her bowels or bladder nor did she bite her tongue.  She had no palpitations prior to that and nor shortness of breath or chest pain.  She had no postictal confusion.  PAST MEDICAL HISTORY:  Remarkable for hypertension and osteoporosis.  PAST SURGICAL HISTORY:  Tonsillectomy, adenoidectomy, appendectomy, benign ovarian tumor treated with total abdominal hysterectomy and bilateral salpingo-oophorectomy 4 or 5 years ago.  CURRENT MEDICATIONS: 1. Toprol XL 50 mg per day. 2. Prinzide 10 mg per day.  ALLERGIES:  PENICILLIN, KEFLEX and intolerance to ACTONEL.  REVIEW OF SYSTEMS:  The patient had a recent upper respiratory infection with nonproductive cough, and sore throat.  She has not had other significant intercurrent infections of head and neck, lungs, GI, GU.  She has a rash about her ankles which is somewhat pruritic and has been a chronic problem.  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  She has not had any problems with anemia or bruisability, diabetes or thyroid disease, ulcers, reflux or constipation, nausea, vomiting, diarrhea, hematochezia or hematemesis, or melena.  No dysuria, hematuria or urinary tract infections.  She is post menopausal and has had TAH/BSO.  Review of systems is otherwise negative.  FAMILY HISTORY:  Father died at age 76, of heart disease.  He also had cancer, I believe of the prostate.  Mother died with cervical cancer.  Patient has several brothers none of whom have severe underlying medical problems.  SOCIAL HISTORY:  The patient does not use tobacco or alcohol.  She is single and never married.  She lives alone.  She is a retired Catering manager.  She has two brothers who live in [place].  PHYSICAL EXAMINATION:  GENERAL:  This is a very pleasant woman in no distress, sitting in bed.  One of her brothers is at her bedside.  VITAL SIGNS:  Blood pressure 136/64, resting pulse 61, respirations 21, temperature 98.3, pulse oximetry 97% at room air.  HEENT:  Ear, nose and throat:  No signs of infection.  In the tympanic membranes there is some mucoid discharge from her nose.  Her pharynx is slightly red.  She has a supple neck.  She has a soft bruit in her right neck.  LUNGS:  Clear.  HEART:  Questionable murmur in the pulmonic region.  Pulses are normal. Regular rhythm on telemetry.  ABDOMEN:  Soft, bowel sounds normal.  No hepatosplenomegaly.  EXTREMITIES:  Normal.  NEUROLOGIC:  Mental status:  The patient was awake, alert, attentive, appropriate, no dysphagia or dyspraxia.  Cranial nerve examination:  Round reactive pupils, normal fundi.  Visual fields full to double simultaneous stimuli okay and responses equal.  Symmetric facial strength.  Midline tongue, and uvula.  Air conduction greater than bone conduction bilaterally.  MOTOR EXAMINATION:  Normal strength and mass.  Good fine motor movements.   No pronator drift.  Sensation intact to cold vibration and stereoagnosis.  Two point discrimination 7-8 mm bilaterally.  Cerebellar examination good finger to nose, rapid repetitive movements, gait was slightly broadbased.  Deep tendon reflexes are diminished and equal.  The patient had bilateral flexor plantar responses.  IMPRESSION:  Syncope, unknown etiology.  The patient could have had cardiac arrhythmia although she had regular sinus rhythm here.  She could have had a seizure but did not have any of the stigmata that go with that.  Finally she could have vertebrobasilar insufficiency but also did not have any focal neurologic deficits.  The patient has had two falls since this summer where she did not lose consciousness, one where she thinks that she tripped, another where she just dropped for reasons she does not understand.  She has had two falls where she had no warning at all in November and last night.  She has had prior labyrinthitis this summer which caused significant vertigo.  These symptoms cleared.  Finally she has a mild organic gait disorder with a slightly broadbased gait and a little unsteadiness when she walks but I do not think that was enough to cause this event.  PLAN: 1. Review electroencephalogram which has already been performed. 2. Review MRI scan from December 11, 1999, which will be brought to my office. 3. Continue telemetry which so far is negative. 4. Carotid Doppler.  If these studies are negative I would simplify her antihypertensives or not even restart them and have her discharged from the hospital for outpatient observation.  I appreciate the opportunity to see her and will see her as needed.  I have discussed this case with Dr. Jonny Ruiz personally, and will be in touch with him once I have had a chance to review the MRI scan personally.  I should mention the patient was on Z-Pak for her respiratory infection. Her laboratory studies have been  unremarkable.  This includes rule out MI. DD:  05/11/00 TD:  05/11/00 Job: [id number]  [id number]

## 2010-09-25 NOTE — Cardiovascular Report (Signed)
Alexis Cobb. [facility]  Patient:    Alexis, Cobb Visit Number: [id number] MRN: [id number]          Service Type: SUR Location: 2300 [id number] [id number] Attending Physician:  Tressie Stalker Dictated by:   Everardo Beals Juanda Chance, M.D. [facility] Proc. Date: 11/13/01 Admit Date:  11/13/2001   CC:         Alexis Cobb. Alexis Cobb, M.D. [facility]. Cornelius Moras, M.D.   Cardiac Catheterization  INDICATIONS:  [name] is 77 years old and has known mitral valve prolapse and had a pacemaker implanted a little over a year ago by Dr. Graciela Husbands for sick sinus syndrome.  She is recently seen with increasing symptoms of dyspnea on exertion and found to have a worse murmur and transthoracic and subsequently transesophageal echocardiogram showed severe mitral regurgitation due to a partially flail anterior mitral leaflet.  She was seen in consultation by Salvatore Decent. Cornelius Moras, M.D. and arrangements were made for her to come in today for catheterization and she is scheduled for a mitral valve repair tomorrow.  She was reported to have a 70% lesion in the diagonal branch on a previous catheterization.  DESCRIPTION OF PROCEDURE:  The procedure was performed via the right femoral artery using arterial sheath and 6 French preformed coronary catheters.  A front wall arterial puncture was performed and Omnipaque contrast was used. Right heart catheterization was performed percutaneously through the right femoral vein using a venous sheath and Swan-Ganz thermodilution catheter.  The patient tolerated the procedure well and left the laboratory in satisfactory condition.  RESULTS: 1. The left main coronary artery was free of significant disease. 2. The left anterior descending artery gave rise to two diagonal branches    and two septal perforators.  There was some irregularity in the LAD in the    midvessel.  There was 70% narrowing in the ostium of the second septal    perforator. 3. The circumflex  artery gave rise to a marginal branch and a small AV branch.    These vessels were free of significant disease. 4. The right coronary artery was a large dominant vessel that gave rise to a    conus branch, a right ventricular branch, a posterior descending branch,    and a posterolateral branch.  These vessels were free of significant    disease. 5. The left ventriculogram performed in the RAO projection showed good wall    motion with no areas of hypokinesis.  The estimated ejection fraction was    70%.  There was severe 4+ mitral regurgitation with rapid opacification of    the left atrium and filling of the pulmonary veins.  HEMODYNAMIC DATA:  The right atrial pressure was 5 mean.  The right ventricular pressure was 38/7.  The pulmonary artery pressure was 38/16 with a mean of 27.  Pulmonary wedge pressure was 14 mean with V waves at 21.  Left ventricular pressure was 123/15 and aortic pressure was 123/67.  Cardiac output/cardiac index was 2.3/1.4 liters/minute/meter squared by Fick and 4.1/2.6 liters/minute/meter squared by thermodilution.  CONCLUSION: 1. Severe mitral regurgitation with well preserved left ventricular function    and mild elevation of the pulmonary artery pressures. 2. NO significant coronary artery disease.  RECOMMENDATIONS:  The patient is scheduled for mitral valve repair by Salvatore Decent. Cornelius Moras, M.D. tomorrow. Dictated by:   Everardo Beals Juanda Chance, M.D. [facility] Attending Physician:  Tressie Stalker DD:  11/13/01 TD:  11/15/01 Job: [id number]

## 2010-09-25 NOTE — Discharge Summary (Signed)
Alexis Cobb. [facility]  Patient:    Alexis Cobb, Alexis Cobb Visit Number: [id number] MRN: [id number]          Service Type: SUR Location: 2000 2014 01 Attending Physician:  Tressie Stalker Dictated by:   Loura Pardon, P.A. Admit Date:  11/13/2001 Disc. Date: 11/20/01   CC:         Alexis Cobb. Cornelius Moras, M.D.  [name] Jens Som, M.D. [facility]  Corwin Levins, M.D. [facility]  [address] Floor   Discharge Summary  DATE OF BIRTH:  1925-02-09  DISCHARGE DIAGNOSES: 1. Severe mitral regurgitation with partially flail mitral leaflet,    preserved left ventricular function, symptoms of progressive fatigue and    dyspnea on admission. 2. Coagulopathy postoperatively, requiring six units of packed red blood    cells, 4 units of fresh frozen plasma, one unit of cryoprecipitate and    two packs of platelets. Hemoglobin stable at discharge.  SECONDARY DIAGNOSES: 1. History of mitral valve prolapse and syndrome in January 2002. 2. Status post permanent pacemaker placement January 2002. 3. Hypertension. 4. Hyperlipidemia. 5. Strong family history of atherosclerotic coronary artery disease.  PROCEDURES: 1. November 13, 2001 left and right heart catheterization, Dr. Charlies Constable.  The    study showed that the ejection fraction was 70%.  There was 4+ mitral    regurgitation.  Coronary artery disease was limited to a 70% stenosis    with a second septal perforator. 2. November 14, 2001 mitral valve prolapse with placement of #26 St. Jude    Seguin ring angioplasty, resection of prolapsed portion of anterior    leaflet with flail cords, chordal transposition from posterior leaflet    times two, artificial Gore-Tex chordal replacement times one.  Surgeon    is Dr. Tressie Stalker.  DISCHARGE DISPOSITION:  [name] is ready to discharge to assisted living on postoperative day six.  She has been afebrile in the postoperative period.  Her mental status has been clear.  She is  ambulating progressively greater distances.  She does have some dyspnea with exertion. She did not experience any respiratory compromise postoperatively.  She was off all supplemental oxygen by postoperative day three and is achieving good oxygen saturations at discharge.  She has not had any cardiac dysrhythmias in the postoperative period, although she was maintained on her pacemaker at advanced rate of 70 in the immediate postoperative period for bradycardia junctional rhythm. [name] incisions are healing nicely.  There was no evidence of erythema, drainage or swelling there at the median sternotomy incision.  She is tolerating low dose beta blocker therapy and she has been replaced on her home medication regimen.  DISCHARGE MEDICATIONS:  She goes home on the following medications: 1. Darvocet N 100 one to two tablets p.o. q.3-4h. p.r.n. pain. 2. Coumadin at low dose of 1 mg at discharge daily and as directed by    the [facility]. 3. Lasix 20 mg two tablets daily on November 20, 2001 to November 24, 2001 and    then 20 mg daily starting November 25, 2001. 4. Potassium chloride 10 mEq two tablets daily November 20, 2001 through    November 24, 2001 and then 10 mEq daily beginning November 25, 2001. 5. Toprol XL 25 mg q.d. 6. Altocor ER 60 mg q.d., #7. 7. Prinzide 10/12.5 mg q.d. one tab.  DISCHARGE ACTIVITY:  She is to ambulate regularly every day to build up her strength.  DISCHARGE DIET:  Low  sodium, low cholesterol diet.  SPECIAL INSTRUCTIONS:  She may shower daily, keeping her incision clean and dry.  FOLLOW-UP:  She has a follow up office visit with Dr. Jens Som.  This will be made prior to her discharge.  She will see Dr. Cornelius Moras in three weeks after her discharge.  Dr. Barry Dienes office will call with this appointment.  Blood tests will be necessary to assess the anticoagulation levels.  Assisted living personnel will draw blood Wednesday, November 22, 2001 for the first  of these blood draws.  The level will be sent to the [facility].  [facility] will adjust the dose as needed.  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  She has a history of syncope in January 2002.  She underwent an extensive cardiac workup.  She was found to have a mitral valve prolapse and mild mitral regurgitation.  She had a 70% stenosis of a septal perforator branch.  A permanent pacemaker was placed at that time.  She has done well except that by February 2003 she developed progressive symptoms of dyspnea and fatigue as well as occasional palpitations.  A follow up transthoracic echocardiogram showed left ventricular function was normal with 2 to 3+ mitral regurgitation. She is referred for possible elective mitral valve repair.  HOSPITAL COURSE:  Alexis Cobb presented to [facility] on November 13, 2001.  She underwent left and right heart catheterization at that time by Dr. Charlies Constable.  The study showed ejection fraction of 70%, 4+ mitral regurgitation and a 70% stenosis in the second septal perforator. She was seen at that time by Dr. Cornelius Moras who described the risks and benefits of mitral valve repair to her.  She elected to undergo the procedure.  This was done on November 14, 2001 as dictated above.  A #26 St. Jude Seguin ring angioplasty was placed, as well as repair of flail mitral leaflets.  She had a severe coagulopathy postoperatively and required transfusion of four units of fresh frozen plasma, one unit of cryoprecipitate, 6 units of red blood cells and 2 packs of platelets.  By the end of the day of surgery, her bleeding had stopped.  She was extubated early on postoperative day one and her hemoglobin  at that time was 9.1 with a hematocrit of 26.  Her platelets were 87,000 and they have continued to rise postoperatively.  She was maintained on a dopamine drip postoperatively which  was discontinued successfully. Due to bradycardia junctional rhythms, her intrinsic permanent pacemaker was reprogrammed to a DDD rate of 70 and her pacing wires were discontinued.  She was started on a low dose beta blocker on postoperative day two as well as Coumadin therapy. She was also restarted on her ACE inhibitor.  By that time, she was ambulating 200 feet with mild dyspnea and fatigue.  By postoperative day three her hemoglobin was 8.8, platelets 103,000.  She was maintaining good oxygen saturation on room air.  She had continued to maintain sinus rhythm. She remained stable for the remaining two days of her hospitalization.  She was therapeutic on Coumadin by postoperative day five and ready for discharge to assisted living postoperative day six. Dictated by:   Loura Pardon, P.A. Attending Physician:  Tressie Stalker DD:  11/19/01 TD:  11/19/01 Job: [id number]

## 2010-09-25 NOTE — Assessment & Plan Note (Signed)
[facility]ALTHC[facility]                              CARDIOLOGY OFFICE NOTE   Alexis Cobb, Alexis Cobb                  MRN:          [id number]  DATE:11/29/2005                            DOB:          [birth year]    [name] returns for a followup today.  When I last saw her she was  complaining of worsening edema.  An echocardiogram showed normal LV function  and no significant mitral regurgitation.  There was mild tricuspid  regurgitation.  Noted her right atrium and right ventricle were mildly  enlarged but her pulmonary artery pressures were normal.  Also, note that we  did check a BNP at the time of her complaint and her BNP was mildly elevated  at 151.  Since I last saw her she continues to have pedal edema.  There was  no chest pain, shortness of breath, or syncope.  She has not changed in  weight.   Her medications include:  1.  Toprol 25 mg p.o. daily.  2.  Multivitamin.  3.  Aspirin 81 mg p.o. daily.  4.  AltoPrev 60 mg p.o. daily.  5.  Meloxicam 15 mg p.o. daily.  6.  Lasix 20 mg p.o. daily.   PHYSICAL EXAMINATION:  VITAL SIGNS:  Today shows a blood pressure of 125/69  and her pulse is 89.  She weighs 118 pounds.  NECK:  Supple with no bruits.  CHEST:  Is clear.  CARDIOVASCULAR:  Regular rate and rhythm.  ABDOMEN:  Shows no masses and no adenopathy.  EXTREMITIES:  Show 2+ edema in the lower extremities bilaterally to just  below the knee.  It appears to be worse on the right than the left.   DIAGNOSES:  1.  New-onset pedal edema.  2.  History of mitral valve repair.  3.  History of pacemaker.  4.  Hypertension.  5.  Hyperlipidemia.   PLAN:  Alexis Cobb continues to have pedal edema despite low-dose  diuretic.  We will continue with the Lasix 20 mg p.o. daily.  We will check  another B-Met to follow her potassium and renal function.  This may be  merely venous insufficiency as she has had some pedal edema in the past.  We  will provide compression hose today.  I have also asked her to keep her legs  elevated.  Hopefully, this will take care of her edema.  I will see her back  in approximately 6  weeks.  Note her echocardiogram showed a normal LV function and the mitral  repair is working appropriately.                              [name] Jens Som, MD, [facility]    BSC/MedQ  DD:  11/29/2005  DT:  11/29/2005  Job #:  [id number]

## 2010-09-25 NOTE — Op Note (Signed)
[place]. [facility]  Patient:    Alexis Cobb, Alexis Cobb Visit Number: [id number] MRN: [id number]          Service Type: SUR Location: 2000 2014 01 Attending Physician:  Tressie Stalker Dictated by:   Salvatore Decent. Cornelius Moras, M.D. Proc. Date: 11/14/01 Admit Date:  11/13/2001   CC:         [name]. Jens Som, M.D. [facility]  Nathen May, M.D., [facility]  Corwin Levins, M.D. [facility]  Bruce R. Juanda Chance, M.D. [facility]   Operative Report  PREOPERATIVE DIAGNOSIS:  Severe mitral regurgitation.  POSTOPERATIVE DIAGNOSIS:  Severe mitral regurgitation.  PROCEDURE:  Median sternotomy for mitral valve repair (resection of prolapsed portion of anterior leaflet with flail chords, chordal transposition from posterior leaflet x2, artificial Gore-Tex chordal replacement x1, #26 [employer] Seguin ring annuloplasty).  SURGEON:  Salvatore Decent. Cornelius Moras, M.D.  ASSISTANT:  Mikey Bussing, M.D.  ANESTHESIA:  General.  BRIEF CLINICAL NOTE:  The patient is a 77 year old white female followed by Corwin Levins, M.D., and referred by [name]. Jens Som, M.D., for management of mitral regurgitation.  The patient presents with progressive symptoms of congestive heart failure.  Transthoracic and follow-up transesophageal echocardiograms demonstrate severe mitral regurgitation with evidence of long-standing mitral valve prolapse.  There is normal left ventricular function.  A full consultation note has been dictated previously.  Cardiac catheterization performed by Bruce R. Juanda Chance, M.D., demonstrates severe mitral regurgitation with normal coronary arteries and normal left ventricular function.  OPERATIVE CONSENT:  The patient has been counseled at length regarding the indications and potential benefits of elective mitral valve repair.  She understands the rationale for proceeding with surgery at this setting, and alternative treatment strategies have been discussed.  She understands and accepts  all associated risks of surgery, including but not limited to risk of death, stroke, myocardial infarction, congestive heart failure, respiratory failure, renal failure, bleeding requiring blood transfusion, arrhythmia, infection, and recurrent mitral valve repair possible need for prosthetic mitral valve replacement.  All of her questions have been addressed.  DESCRIPTION OF PROCEDURE:  The patient is brought to the operating room on the above-mentioned date, and central monitoring was established by the anesthesia service under the care and direction of J. Claybon Jabs, M.D.  Specifically, a Swan-Ganz catheter is placed through the right internal jugular approach.  A left radial arterial line is placed.  Intravenous antibiotics are administered.  The patient is placed in the supine position on the operating table.  Following induction with general endotracheal anesthesia, a Foley catheter is placed.  Transesophageal echocardiogram is performed by Dr. Krista Blue.  This confirms the presence of severe (4+) mitral regurgitation.  There is severe prolapse of the middle portion of the anterior leaflet of the mitral valve with what appears to be flail chords.  There is 4+ mitral regurgitation with evidence of flow reversal in the pulmonary veins.  There is no prolapse involving the posterior leaflet of the mitral valve.  No other significant abnormalities are identified.  The patients chest, abdomen, both groins, and both lower extremities are prepared and draped in a sterile manner.  A median sternotomy incision is performed.  The patient is heparinized systemically.  The pericardium is opened.  The ascending aorta is normal in appearance.  The ascending aorta is cannulated uneventfully for cardiopulmonary bypass.  Dual venous cannulation is obtained, placing a right angle cannula directly in the superior vena cava and a second straight cannula placed low in the right  atrium with its  tip extending down in the inferior vena cava.  A retrograde cardioplegia catheter is placed through the right atrium into the coronary sinus.  Adequate heparinization is verified.  Cardiopulmonary bypass is begun.  Vessel loops are placed around the superior and inferior vena cavae, respectively.  A cardioplegia catheter is placed in the ascending aorta.  A temperature probe is placed in the left ventricular septum.  A Styrofoam pad is placed to protect the left phrenic nerve from thermal injury.  The patient is cooled to 28 degrees systemic temperature.  The aortic crossclamp is applied and cardioplegia is delivered initially in an antegrade fashion through the aortic root.  Supplemental cardioplegia is administered retrograde through the coronary sinus catheter.  Repeat doses of cardioplegia are administered intermittently throughout the crossclamp portion of the operation retrograde through the coronary sinus catheter to maintain septal temperature below 15 degrees Centigrade.  The initial cardioplegic arrest and myocardial cooling are felt to be excellent.  A left atriotomy incision is performed through the interatrial groove posteriorly.  The mitral valve is exposed using a self-retaining retractor. Exposure is felt to be excellent.  The mitral valve is carefully inspected. There is evidence of severe prolapse involving the middle scallop (A2) portion of the anterior leaflet with three flail chords to this segment.  The prolapsed portion is directly in the middle of the anterior leaflet and is not eccentrically located whatsoever.  There is no prolapse involving either A1 or A3 portions of the anterior leaflet, nor is there any prolapse of any portion of the posterior leaflet nor of either commissure.  Mitral valve repair is  performed using the following technique:  (1) A small triangular resection of the middle portion of the prolapsed area of the anterior leaflet of the  mitral valve is performed.  This defect is closed using interrupted 4-0 Ethibond sutures to facilitate uncurling of the furled and redundant, chronically prolapsed portion of the anterior leaflet.  (2) Chordal transposition is performed by creating two small rectangular pedicles from the middle portion of the posterior leaflet (P2), which are then transposed to the undersurface of the free edge of the middle portion of the anterior leaflet.  These are secured in place using 4-0 Ethibond sutures.  Each of these transposed portions of posterior leaflet with their associated primary chords are secured to either side of the repaired segment of the anterior leaflet.  After completion of this there remains a slight amount of prolapse on one side of the anterior leaflet, located more toward the A3 portion.  This is corrected with a single artificial Gore-Tex chord, which is secured to the head of the anterior papillary muscle in the usual fashion.  After completion of chordal transposition and the placement of the single artificial chord, the prolapse appears to be completely corrected.  (3) The remaining defect in the middle portion of the posterior leaflet is then closed using a single 2-0 Ethibond compression suture at the level of the annulus, followed by interrupted 5-0 Ethibond sutures to reclose the free edge of the leaflet itself.  (4) Ring annuloplasty is performed using interrupted 2-0 Ethibond horizontal mattress sutures placed circumferentially around the entire annulus of the mitral valve. A #26 Seguin annuloplasty ring is secured in place uneventfully.  After completion of the mitral valve repair, the valve is tested by instilling iced saline directly into the left ventricular cavity through a small rubber catheter.  The valve appears to be perfectly competent.  Rewarming is  begun. The left atriotomy is closed using a standard two-layer closure with running 3-0 Prolene suture after  first oversewing the left atrial appendage from inside the left atrium with two-layer closure of running 3-0 Prolene suture.  The patient is placed in Trendelenburg position, and one final dose of warm retrograde hot-shot cardioplegia is administered.  The heart is allowed to fill, and the lungs are ventilated to facilitate removal of any residual air from the left heart circulation and pulmonary veins.  The aortic crossclamp is removed after a total crossclamp time of 98 minutes.  The heart begins to beat spontaneously without need for cardioversion.  Epicardial pacing wires are affixed to the right ventricular outflow tract and to the right atrial appendage.  A retrograde cardioplegia catheter is removed and the inferior vena cava cannula is removed.  During this portion of the procedure, the aortic root is vented through the previous cardioplegia catheter.  At this juncture, a brief look with transesophageal echocardiogram is performed. There appears to be excellent result following mitral valve repair with no residual mitral regurgitation.  There appears to be insignificant residual air in the left heart circulation.  The aortic root vent is removed.  The patient is weaned from cardiopulmonary bypass without difficulty.  The patients rhythm at separation from bypass is normal sinus rhythm.  Atrial pacing is employed to increase the heart rate.  No inotropic support is required.  Total cardiopulmonary bypass time for the operation is 139 minutes. Follow-up transesophageal echocardiogram performed by Dr. Krista Blue after separation from bypass demonstrates preserved left ventricular function with an excellent result following mitral valve repair.  There appears to be a well-seated mitral valve annuloplasty ring.  There is no residual mitral valve prolapse.  There is excellent leaflet mobility, and there is trivial residual mitral regurgitation.  The superior vena cava cannula and aortic  cannula are both removed uneventfully.  Protamine is administered to reverse the anticoagulation.  The mediastinum is irrigated with saline solution containing vancomycin. Meticulous surgical hemostasis is ascertained.  There is severe coagulopathy appreciated.  An exhaustive search for potential sites of mechanical bleeding is performed.  There remains severe coagulopathy.  Of note, the patient was noted to develop anemia and severe thrombocytopenia during cardiopulmonary bypass due to dilution.  The patient was transfused a total of two units of packed red blood cells during cardiopulmonary bypass due to anemia with a hematocrit of 16%.  After reversal of heparin with protamine, the patient was transfused a total of two 10-packs of adult platelets due to severe thrombocytopenia.  The patient was also transfused a total of four units of fresh frozen plasma and one 10-pack of adult cryoprecipitate due to severe coagulopathy with prolonged coagulation times.  Ultimately hemostasis appears satisfactory.  The mediastinum and both left and right pleural spaces are drained with four chest tubes placed through separate stab incisions inferiorly.  The median sternotomy is closed in the routine fashion.  The skin incision is closed with subcuticular skin closure.  The patient tolerated the procedure well and is transported to the surgical intensive care unit in stable condition.  There are no intraoperative complications.  All sponge, instrument, and needle counts are verified correct at completion of the operation.  The patient was additionally transfused two units of packed red blood cells after reversal of heparin with protamine due to continued anemia and volume requirement.     Dictated by:   Salvatore Decent Cornelius Moras, M.D. Attending Physician:  Tressie Stalker DD:  11/14/01  TD:  11/17/01 Job: [id number]

## 2010-09-25 NOTE — H&P (Signed)
[place]. [employer]  Patient:    ALIANYS, CHACKO Visit Number: [id number] MRN: [id number]          Service Type: END Location: ENDO Attending Physician:  Junious Silk Dictated by:   Lavella Hammock, P.A. Admit Date:  09/14/2001 Discharge Date: 09/14/2001                           History and Physical  DATE OF BIRTH:  August 12, 1924  CHIEF COMPLAINT:  Chest pain with severe mitral regurgitation.  HISTORY OF PRESENT ILLNESS:  Mrs. Hilgers is a 77 year old female with a history of syncope in 2002 at which time mitral valve prolapse was detected. She has had worsening symptoms including chest pain with exertion up to an 8/10 that is relieved by rest and has limited her activity significantly.  She also had some shortness of breath.  She is admitted today for a right and left catheterization in preparation for mitral valve replacement plus or minus bypass surgery if needed.  She is pain free at the time of exam.  PAST MEDICAL HISTORY:  She has a history of syncope and is status post placement of a permanent pacemaker in January 2002 with a [employer] Kappa serial number [id number] dual lead pacemaker.  This was inserted for carotid sinus hypersensitivity.  She has a history of hypertension and hyperlipidemia.  She denies any history of diabetes mellitus, previous stroke or rheumatic heart disease.  She has no history of congestive heart failure.  She has a family history of coronary artery disease.  She also has osteoporosis.  PAST SURGICAL HISTORY:  She is status post tonsillectomy and adenoidectomy, as well as appendectomy.  She has had a benign ovarian tumor removed.  She later had a hysterectomy.  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 She has never smoked and she does not abuse alcohol.  She has a supportive family. She has been active and independent her entire life.  FAMILY HISTORY:  She has a father  who died of probable coronary artery disease and two brothers who are living and have coronary artery disease.  One of whom has had bypass surgery.  ALLERGIES:  KEFLEX and PENICILLIN, both cause a rash and itching.  CURRENT MEDICATIONS: 1. Prinzide 10/12.5 q.d. 2. Lasix 20 mg q.d. 3. Altocor ER 60 mg q.d. 4. Multivitamin q.d. 5. Aspirin 325 mg q.d., held times two weeks.  REVIEW OF SYSTEMS:  Positive for nasal congestion times two days with some sneezing.  She also admits to dyspnea on exertion.  She denies paroxysmal nocturnal dyspnea.  She has some orthopnea.  She has no edema.  She has no productive cough or hemoptysis.  She has no history of hematemesis and denies melena.  She also denies gastroesophageal reflux disease symptoms.  She has cervical degenerative disc disease in her neck which causes her some chronic pain but is otherwise stable.  Review of systems is otherwise negative.  PHYSICAL EXAMINATION:  VITAL SIGNS:  Temperature 97.0, blood pressure 119/69, pulse 69 regular, respirations 16 and not labored.  GENERAL: She is a well-developed, elderly white female in no acute distress.  HEENT:  She has nasal congestion with some clear drainage.  Her head is normocephalic and atraumatic.  Her pupils are equal, round and reactive to light and accommodation.  Extraocular movements are intact.  NECK:  There is no JVD noted.  There is no bruit  although there is radiation of murmur to her carotid arteries.  There is no thyromegaly noted.  CHEST:  Clear to auscultation bilaterally.  CV:  Regular rate and rhythm with a 2/6 systolic ejection murmur.  ABDOMEN: Soft and nontender with no bruit noted.  EXTREMITIES:  There is no edema noted.  There is a decrease in her right DP but the DP is 2 to 3+.  Radial, femoral and popliteal pulses are 2 to 3+ bilaterally.  NEUROLOGIC:  She is alert and oriented with no focal deficits noted and cranial nerves 2 through 12 grossly  intact.  ECG:  Sinus rhythm with left axis deviation and Q waves in V1 and V2.  Chest x-ray:  No active disease.  LABORATORY DATA:  ABG on room air shows a pH of 7.445 with pCO2 240.5, pO2 74.8, bicarbonate 27.4.  Hemoglobin 13.4, hematocrit 40.2, WBC 7.4, platelets 209.  INR 1.0 with a PTT of 29.  Sodium 139, potassium 3.4, chloride 104, CO2 25, BUN 15, creatinine 0.8, glucose 101.  Other CMET values within normal limits.  Urinalysis had rare squamous epithelial cells with 3 to 6 WBCs, few bacteria, as well as small leukocytes.  ASSESSMENT/PLAN: 1. Chest pain:  The patient had a clean catheterization when she was receiving    her initial evaluation in 2002.  She is for repeat right and left heart    catheterization today prior to valve surgery. 2. Mitral prolapse:  She is being admitted by the surgical service today for    mitral valve replacement tomorrow. 3. Hypokalemia:  Supplement to be given prior to catheterization. 4. Nasal congestion:  The patient will have Claritin added to her medication    regimen. 5. The patient is otherwise stable and will be continued on her other home    medications. Dictated by:   Lavella Hammock, P.A. Attending Physician:  Junious Silk DD:  11/13/01 TD:  11/13/01 Job: [id number]

## 2010-11-02 ENCOUNTER — Other Ambulatory Visit: Payer: Self-pay | Admitting: Internal Medicine

## 2010-11-19 ENCOUNTER — Other Ambulatory Visit: Payer: Self-pay | Admitting: *Deleted

## 2010-11-19 MED ORDER — LISINOPRIL 10 MG PO TABS: 10.0000 mg | ORAL_TABLET | Freq: Every day | ORAL | Status: DC

## 2010-11-27 ENCOUNTER — Encounter: Payer: Self-pay | Admitting: Internal Medicine

## 2010-11-27 ENCOUNTER — Ambulatory Visit (INDEPENDENT_AMBULATORY_CARE_PROVIDER_SITE_OTHER): Payer: Medicare Other | Admitting: Internal Medicine

## 2010-11-27 DIAGNOSIS — Z0000 Encounter for general adult medical examination without abnormal findings: Secondary | ICD-10-CM | POA: Insufficient documentation

## 2010-11-27 DIAGNOSIS — Z0001 Encounter for general adult medical examination with abnormal findings: Secondary | ICD-10-CM | POA: Insufficient documentation

## 2010-11-27 DIAGNOSIS — R21 Rash and other nonspecific skin eruption: Secondary | ICD-10-CM

## 2010-11-27 DIAGNOSIS — E785 Hyperlipidemia, unspecified: Secondary | ICD-10-CM

## 2010-11-27 DIAGNOSIS — I10 Essential (primary) hypertension: Secondary | ICD-10-CM

## 2010-11-27 MED ORDER — METHYLPREDNISOLONE ACETATE 80 MG/ML IJ SUSP
120.0000 mg | Freq: Once | INTRAMUSCULAR | Status: AC
  Administered 2010-11-27: 120 mg via INTRAMUSCULAR

## 2010-11-27 MED ORDER — PREDNISONE 10 MG PO TABS: 10.0000 mg | ORAL_TABLET | Freq: Every day | ORAL | Status: DC

## 2010-11-27 MED ORDER — AMLODIPINE BESYLATE 5 MG PO TABS: 5.0000 mg | ORAL_TABLET | Freq: Every day | ORAL | Status: DC

## 2010-11-27 NOTE — Assessment & Plan Note (Signed)
Allergic type, for depomedrol IM, predpack for home, benadryl otc prn, stop the ACE, Continue all other medications as before

## 2010-11-27 NOTE — Assessment & Plan Note (Addendum)
stable overall by hx and exam, most recent data reviewed with pt, and pt to continue medical treatment as before  Lab Results  Component Value Date   LDLCALC 90 11/21/2009   Declines lab now, but will do next visit, to cont diet

## 2010-11-27 NOTE — Assessment & Plan Note (Signed)
stable overall by hx and exam, most recent data reviewed with pt, and pt to continue medical treatment as before except stop the ACE due to rash that could be related today, and start the amlodipine5 , f/u 4 wks

## 2010-11-27 NOTE — Progress Notes (Signed)
Subjective:    Patient ID: Alexis Cobb, female    DOB: 19-Jan-1925, 77 y.o.   MRN: [id number]  HPI here for acute visit - c/o mult areas of itchy burning stinging rash to extremities and torso, without lip swelling, tongue swelling, wheezing, and Pt denies chest pain, increased sob or doe, wheezing, orthopnea, PND, increased LE swelling, palpitations, dizziness or syncope.  Pt denies new neurological symptoms such as new headache, or facial or extremity weakness or numbness   Pt denies polydipsia, polyuria,   Pt states overall good compliance with meds has been taking the ACE for yrs.  No other unsual supplements or other meds, exposure or travel.  Not taking lasix.  Has been trying to follow lower chol diet as well.  Did have episode of chiggers 3 wks ago, now resolved after working in the yard. Past Medical History  Diagnosis Date  . Hyperlipidemia   . HTN (hypertension)   . Arthritis   . Hypersensitivity   . S/P placement of cardiac pacemaker   . ANKLE PAIN, LEFT 10/02/2009  . ARTHRITIS, RHEUMATOID, HX OF 01/06/2007  . CELLULITIS, LEG, LEFT 10/02/2009  . CERUMEN IMPACTION, RIGHT 08/01/2009  . HYPERLIPIDEMIA 01/06/2007  . HYPERTENSION 01/06/2007  . LEG PAIN, LEFT 10/02/2009  . MITRAL REGURGITATION 01/06/2007  . OSTEOARTHRITIS 01/14/2007  . OSTEOPOROSIS 01/14/2007  . Other specified forms of hearing loss 08/01/2009  . OTITIS EXTERNA, ACUTE 08/01/2009  . PACEMAKER, PERMANENT 01/06/2007  . Raynaud's syndrome 01/06/2007   No past surgical history on file.  reports that she has never smoked. She does not have any smokeless tobacco history on file. She reports that she does not drink alcohol or use illicit drugs. family history includes Coronary artery disease in an unspecified family member and Hypertension in an unspecified family member. Allergies  Allergen Reactions  . Cephalexin   . Penicillins   . Risedronate Sodium    Current Outpatient Prescriptions on File Prior to Visit  Medication  Sig Dispense Refill  . aspirin 81 MG tablet Take 81 mg by mouth daily.        . Calcium Carbonate-Vitamin D (CALCIUM + D PO) Take by mouth daily.        . Cholecalciferol (VITAMIN D) 1000 UNITS capsule Take 1,000 Units by mouth daily.        . clindamycin (CLEOCIN) 300 MG capsule Take by mouth. Take as directed for dental procedures as needed      . metoprolol (TOPROL-XL) 50 MG 24 hr tablet Take 1.5 tablets (75 mg total) by mouth daily.  45 tablet  6  . MEVACOR 40 MG tablet TAKE 1 TABLET EACH DAY.  30 each  0  . Multiple Vitamins-Minerals (CEROVITE PO) 1 tab po qd        No current facility-administered medications on file prior to visit.   Review of Systems Review of Systems  Constitutional: Negative for diaphoresis and unexpected weight change.  HENT: Negative for drooling and tinnitus.   Eyes: Negative for photophobia and visual disturbance.  Respiratory: Negative for choking and stridor.   Gastrointestinal: Negative for vomiting and blood in stool.     Objective:   Physical Exam BP 122/68  Pulse 87  Temp(Src) 98.2 F (36.8 C) (Oral)  Ht 5\' 3"  (1.6 m)  Wt 119 lb 2 oz (54.035 kg)  BMI 21.10 kg/m2  SpO2 98% Physical Exam  VS noted Constitutional: Pt appears well-developed and well-nourished.  HENT: Head: Normocephalic.  Right Ear: External ear normal.  Left Ear: External ear normal.  Eyes: Conjunctivae and EOM are normal. Pupils are equal, round, and reactive to light.  Neck: Normal range of motion. Neck supple.  Cardiovascular: Normal rate and regular rhythm.   Pulmonary/Chest: Effort normal and breath sounds normal.  Abd:  Soft, NT, non-distended, + BS Neurological: Pt is alert. No cranial nerve deficit.  Skin: Skin is warm. Has mult areas of plaquelike erythema to arms, upper back, abd and legs   Psychiatric: Pt behavior is normal. Thought content normal.         Assessment & Plan:

## 2010-11-27 NOTE — Patient Instructions (Signed)
You had the steroid shot today Please stop the Lisinopril (zestril) Start the Amlodipine 5 mg per day Take all new medications as prescribed - the prednisone You can also take Benadryl 25- 50 mg OTC every 6-8 hrs for the skin symptoms Please return in 1 month to re-check the Blood Pressure

## 2010-12-07 ENCOUNTER — Telehealth: Payer: Self-pay | Admitting: *Deleted

## 2010-12-07 DIAGNOSIS — R21 Rash and other nonspecific skin eruption: Secondary | ICD-10-CM

## 2010-12-07 MED ORDER — PREDNISONE 10 MG PO TABS: 10.0000 mg | ORAL_TABLET | Freq: Every day | ORAL | Status: AC

## 2010-12-07 NOTE — Telephone Encounter (Signed)
Pt advised and will call back if no improvement.

## 2010-12-07 NOTE — Telephone Encounter (Signed)
predpack done per emr

## 2010-12-07 NOTE — Telephone Encounter (Signed)
Done per emr 

## 2010-12-07 NOTE — Telephone Encounter (Addendum)
Pt states she took the last Pred Saturday night and awoke on Sunday with a return of the rash but with less intensity. Pt is requesting to repeat Pred dose, please advise.

## 2010-12-07 NOTE — Telephone Encounter (Signed)
Pt left vm - she is began to improve but now becoming sick again. OV needed?

## 2010-12-16 ENCOUNTER — Other Ambulatory Visit: Payer: Self-pay | Admitting: Internal Medicine

## 2010-12-24 ENCOUNTER — Other Ambulatory Visit: Payer: Self-pay | Admitting: Internal Medicine

## 2010-12-24 ENCOUNTER — Encounter: Payer: Self-pay | Admitting: Internal Medicine

## 2010-12-24 ENCOUNTER — Ambulatory Visit (INDEPENDENT_AMBULATORY_CARE_PROVIDER_SITE_OTHER): Payer: Medicare Other | Admitting: *Deleted

## 2010-12-24 DIAGNOSIS — Z950 Presence of cardiac pacemaker: Secondary | ICD-10-CM

## 2010-12-24 DIAGNOSIS — I498 Other specified cardiac arrhythmias: Secondary | ICD-10-CM

## 2010-12-24 LAB — REMOTE PACEMAKER DEVICE
AL IMPEDENCE PM: 393 Ohm
ATRIAL PACING PM: 71
RV LEAD THRESHOLD: 1 V

## 2010-12-31 ENCOUNTER — Ambulatory Visit (INDEPENDENT_AMBULATORY_CARE_PROVIDER_SITE_OTHER): Payer: Medicare Other | Admitting: Internal Medicine

## 2010-12-31 ENCOUNTER — Encounter: Payer: Self-pay | Admitting: Internal Medicine

## 2010-12-31 VITALS — BP 120/82 | HR 66 | Temp 97.6°F | Ht 64.0 in | Wt 114.2 lb

## 2010-12-31 DIAGNOSIS — R531 Weakness: Secondary | ICD-10-CM | POA: Insufficient documentation

## 2010-12-31 DIAGNOSIS — R5381 Other malaise: Secondary | ICD-10-CM

## 2010-12-31 DIAGNOSIS — R21 Rash and other nonspecific skin eruption: Secondary | ICD-10-CM

## 2010-12-31 DIAGNOSIS — G250 Essential tremor: Secondary | ICD-10-CM | POA: Insufficient documentation

## 2010-12-31 DIAGNOSIS — M5412 Radiculopathy, cervical region: Secondary | ICD-10-CM

## 2010-12-31 DIAGNOSIS — M130 Polyarthritis, unspecified: Secondary | ICD-10-CM

## 2010-12-31 DIAGNOSIS — I080 Rheumatic disorders of both mitral and aortic valves: Secondary | ICD-10-CM

## 2010-12-31 DIAGNOSIS — Z0000 Encounter for general adult medical examination without abnormal findings: Secondary | ICD-10-CM

## 2010-12-31 DIAGNOSIS — I059 Rheumatic mitral valve disease, unspecified: Secondary | ICD-10-CM | POA: Insufficient documentation

## 2010-12-31 DIAGNOSIS — R5383 Other fatigue: Secondary | ICD-10-CM

## 2010-12-31 DIAGNOSIS — I10 Essential (primary) hypertension: Secondary | ICD-10-CM

## 2010-12-31 MED ORDER — LOSARTAN POTASSIUM 50 MG PO TABS: 50.0000 mg | ORAL_TABLET | Freq: Every day | ORAL | Status: DC

## 2010-12-31 MED ORDER — PREDNISONE 10 MG PO TABS: ORAL_TABLET | ORAL | Status: DC

## 2010-12-31 NOTE — Assessment & Plan Note (Signed)
For sed rate, predpack trial after next labs (which will include am coritsol), and f/u with rheum as she is due

## 2010-12-31 NOTE — Assessment & Plan Note (Signed)
Allergic type, now resolved with recent predpack, though due to ACE

## 2010-12-31 NOTE — Assessment & Plan Note (Signed)
For f/u echo, last echo 2011 reviewed with pt, to f/u with cardiology as planned

## 2010-12-31 NOTE — Patient Instructions (Signed)
OK to stop the amlodipine (norvasc) Start the losartan 50 mg per day (for blood pressure) Please go to LAB in the Basement for the blood and/or urine tests to be done in the AM in the next few business days as you are able (thre lab opens at 0730) Please call the phone number [phone] (the [employer] System) for results of testing in 2-3 days;  When calling, simply dial the number, and when prompted enter the MRN number above (the Medical Record Number) and the # key, then the message should start. AFTER the blood draw, please take the prednisone prescription as prescribed You will be contacted regarding the referral for: Dr Beekman/rheumatology You will be contacted regarding the referral for: Echocardiogram Please keep your appt soon with Dr Jens Som Continue all other medications as before Please return in 6 months, or sooner if needed Please call for Neurology referral if your head tremor or neck pain seem worse

## 2010-12-31 NOTE — Assessment & Plan Note (Signed)
For AM cortisol with next lab draw

## 2010-12-31 NOTE — Assessment & Plan Note (Signed)
Right, mild recurrent, pt declines further eval or neuro eval at this time

## 2010-12-31 NOTE — Assessment & Plan Note (Signed)

## 2010-12-31 NOTE — Progress Notes (Signed)
Subjective:    Patient ID: Alexis Cobb, female    DOB: 1925-03-24, 77 y.o.   MRN: [id number]  HPI  Here for wellness and f/u;  Overall doing ok;  Pt denies CP, worsening SOB, DOE, wheezing, orthopnea, PND, worsening LE edema, palpitations, dizziness or syncope.  Pt denies neurological change such as new Headache, facial or extremity weakness.  Pt denies polydipsia, polyuria, or low sugar symptoms. Pt states overall good compliance with treatment and medications, good tolerability, and trying to follow lower cholesterol diet.  Pt denies worsening depressive symptoms, suicidal ideation or panic. No fever, wt  night sweats, loss of appetite, or other constitutional symptoms.  Pt states good ability with ADL's, moderate fall risk, walks with cane, home safety reviewed and adequate, no significant changes in hearing or vision, and occasionally active with exercise.  Unfortunately now with increased pain in the morning that seems related to her seroneg polyarthropathy, mostly in the right hip and knee, and shoullders, better later in the day. Previous rash from last visits have been negative.  C/o significant weakness and exhaustion for over a month that she wonders might be related to the new amlodipine, since it was listed on the medicine informaiton side effect.  Lost 5 lbs since last visit.   Past Medical History  Diagnosis Date  . Hyperlipidemia   . HTN (hypertension)   . Arthritis   . Hypersensitivity   . S/P placement of cardiac pacemaker   . ANKLE PAIN, LEFT 10/02/2009  . ARTHRITIS, RHEUMATOID, HX OF 01/06/2007  . CELLULITIS, LEG, LEFT 10/02/2009  . CERUMEN IMPACTION, RIGHT 08/01/2009  . HYPERLIPIDEMIA 01/06/2007  . HYPERTENSION 01/06/2007  . LEG PAIN, LEFT 10/02/2009  . MITRAL REGURGITATION 01/06/2007  . OSTEOARTHRITIS 01/14/2007  . OSTEOPOROSIS 01/14/2007  . Other specified forms of hearing loss 08/01/2009  . OTITIS EXTERNA, ACUTE 08/01/2009  . PACEMAKER, PERMANENT 01/06/2007  . Raynaud's  syndrome 01/06/2007   No past surgical history on file.  reports that she has never smoked. She does not have any smokeless tobacco history on file. She reports that she does not drink alcohol or use illicit drugs. family history includes Coronary artery disease in an unspecified family member and Hypertension in an unspecified family member. Allergies  Allergen Reactions  . Cephalexin   . Penicillins   . Risedronate Sodium    Current Outpatient Prescriptions on File Prior to Visit  Medication Sig Dispense Refill  . amLODipine (NORVASC) 5 MG tablet Take 1 tablet (5 mg total) by mouth daily.  30 tablet  11  . aspirin 81 MG tablet Take 81 mg by mouth daily.        . Calcium Carbonate-Vitamin D (CALCIUM + D PO) Take by mouth daily.        . Cholecalciferol (VITAMIN D) 1000 UNITS capsule Take 1,000 Units by mouth daily.        . clindamycin (CLEOCIN) 300 MG capsule Take by mouth. Take as directed for dental procedures as needed      . metoprolol (TOPROL-XL) 50 MG 24 hr tablet Take 1.5 tablets (75 mg total) by mouth daily.  45 tablet  6  . MEVACOR 40 MG tablet TAKE 1 TABLET EACH DAY.  30 each  11  . Multiple Vitamins-Minerals (CEROVITE PO) 1 tab po qd        Review of Systems Review of Systems  Constitutional: Negative for diaphoresis, activity change, appetite change and unexpected weight change.  HENT: Negative for hearing loss, ear pain, facial  swelling, mouth sores and neck stiffness.   Eyes: Negative for pain, redness and visual disturbance.  Respiratory: Negative for shortness of breath and wheezing.   Cardiovascular: Negative for chest pain and palpitations.  Gastrointestinal: Negative for diarrhea, blood in stool, abdominal distention and rectal pain.  Genitourinary: Negative for hematuria, flank pain and decreased urine volume.  Musculoskeletal: Negative for myalgias and joint swelling.  Skin: Negative for color change and wound.  Neurological: Negative for syncope and numbness.  except for recurrent neck pain with occas pain/numbness to the RUE to the tip of the index finger; ? Significant head tremor as well Hematological: Negative for adenopathy.  Psychiatric/Behavioral: Negative for hallucinations, self-injury, decreased concentration and agitation.      Objective:   Physical Exam BP 120/82  Pulse 66  Temp(Src) 97.6 F (36.4 C) (Oral)  Ht 5\' 4"  (1.626 m)  Wt 114 lb 4 oz (51.823 kg)  BMI 19.61 kg/m2  SpO2 97% Physical Exam  VS noted Constitutional: Pt is oriented to person, place, and time. Appears well-developed and well-nourished.  HENT:  Head: Normocephalic and atraumatic.  Right Ear: External ear normal.  Left Ear: External ear normal.  Nose: Nose normal.  Mouth/Throat: Oropharynx is clear and moist.  Eyes: Conjunctivae and EOM are normal. Pupils are equal, round, and reactive to light.  Neck: Normal range of motion. Neck supple. No JVD present. No tracheal deviation present.  Cardiovascular: Normal rate, regular rhythm, normal heart sounds and intact distal pulses.   Pulmonary/Chest: Effort normal and breath sounds normal.  Abdominal: Soft. Bowel sounds are normal. There is no tenderness.  Musculoskeletal: Normal range of motion. Exhibits no edema.  Lymphadenopathy:  Has no cervical adenopathy.  Neurological: Pt is alert and oriented to person, place, and time. Pt has normal reflexes. No cranial nerve deficit. Motor/dtr intact; gait somewhat unsteady Skin: Skin is warm and dry. No rash noted.  Psychiatric:  Has  normal mood and affect. Behavior is normal. quite tired appearing, but not overly anxious or depressed appearing No active synovitis,       Assessment & Plan:

## 2010-12-31 NOTE — Assessment & Plan Note (Addendum)
Although not clear to me, pt concerned the new recent norvasc is cause of her exhaustion;  To that end will d/c today, and start losartan 50 qd

## 2010-12-31 NOTE — Assessment & Plan Note (Signed)
Mild persistent, declines neuro eval at this tim

## 2011-01-01 NOTE — Progress Notes (Signed)
Pacer remote check  

## 2011-01-05 ENCOUNTER — Other Ambulatory Visit (INDEPENDENT_AMBULATORY_CARE_PROVIDER_SITE_OTHER): Payer: Medicare Other

## 2011-01-05 DIAGNOSIS — Z0000 Encounter for general adult medical examination without abnormal findings: Secondary | ICD-10-CM

## 2011-01-05 DIAGNOSIS — M130 Polyarthritis, unspecified: Secondary | ICD-10-CM

## 2011-01-05 DIAGNOSIS — I10 Essential (primary) hypertension: Secondary | ICD-10-CM

## 2011-01-05 DIAGNOSIS — I080 Rheumatic disorders of both mitral and aortic valves: Secondary | ICD-10-CM

## 2011-01-05 LAB — HEPATIC FUNCTION PANEL
ALT: 17 U/L (ref 0–35)
AST: 26 U/L (ref 0–37)
Alkaline Phosphatase: 52 U/L (ref 39–117)
Bilirubin, Direct: 0.1 mg/dL (ref 0.0–0.3)
Total Bilirubin: 0.5 mg/dL (ref 0.3–1.2)

## 2011-01-05 LAB — LIPID PANEL
Cholesterol: 182 mg/dL (ref 0–200)
LDL Cholesterol: 89 mg/dL (ref 0–99)
Total CHOL/HDL Ratio: 2

## 2011-01-05 LAB — CBC WITH DIFFERENTIAL/PLATELET
Basophils Relative: 0.5 % (ref 0.0–3.0)
Eosinophils Absolute: 0.1 10*3/uL (ref 0.0–0.7)
MCHC: 33.2 g/dL (ref 30.0–36.0)
MCV: 98.1 fl (ref 78.0–100.0)
Monocytes Absolute: 0.5 10*3/uL (ref 0.1–1.0)
Neutrophils Relative %: 59.2 % (ref 43.0–77.0)
Platelets: 235 10*3/uL (ref 150.0–400.0)
RDW: 16.8 % — ABNORMAL HIGH (ref 11.5–14.6)

## 2011-01-05 LAB — BASIC METABOLIC PANEL
Chloride: 108 mEq/L (ref 96–112)
Potassium: 3.8 mEq/L (ref 3.5–5.1)

## 2011-01-05 LAB — URINALYSIS, ROUTINE W REFLEX MICROSCOPIC
Ketones, ur: NEGATIVE
Nitrite: NEGATIVE
Specific Gravity, Urine: 1.015 (ref 1.000–1.030)
pH: 7 (ref 5.0–8.0)

## 2011-01-05 LAB — TSH: TSH: 1.24 u[IU]/mL (ref 0.35–5.50)

## 2011-01-18 ENCOUNTER — Other Ambulatory Visit (HOSPITAL_COMMUNITY): Payer: Medicare Other | Admitting: Radiology

## 2011-01-18 ENCOUNTER — Ambulatory Visit (INDEPENDENT_AMBULATORY_CARE_PROVIDER_SITE_OTHER): Payer: Medicare Other | Admitting: Cardiology

## 2011-01-18 ENCOUNTER — Ambulatory Visit: Payer: Medicare Other | Admitting: Cardiology

## 2011-01-18 ENCOUNTER — Encounter: Payer: Self-pay | Admitting: Cardiology

## 2011-01-18 ENCOUNTER — Telehealth: Payer: Self-pay

## 2011-01-18 VITALS — BP 150/80 | HR 83 | Ht 64.0 in | Wt 117.0 lb

## 2011-01-18 DIAGNOSIS — I340 Nonrheumatic mitral (valve) insufficiency: Secondary | ICD-10-CM

## 2011-01-18 DIAGNOSIS — R609 Edema, unspecified: Secondary | ICD-10-CM

## 2011-01-18 DIAGNOSIS — Z950 Presence of cardiac pacemaker: Secondary | ICD-10-CM

## 2011-01-18 DIAGNOSIS — I059 Rheumatic mitral valve disease, unspecified: Secondary | ICD-10-CM

## 2011-01-18 DIAGNOSIS — I080 Rheumatic disorders of both mitral and aortic valves: Secondary | ICD-10-CM

## 2011-01-18 MED ORDER — FUROSEMIDE 20 MG PO TABS: ORAL_TABLET | ORAL | Status: DC

## 2011-01-18 NOTE — Telephone Encounter (Signed)
Instead of me doing this, I would encourage the following:  1.  Dr Jens Som (or staff) should review the emr chart to find out when the echo is scheduled  2.  Then Dr Jens Som (or staff) should make a "reminder" on the EMR to him/herself to review the (or a "staff message" would work just as well)  This way, they would create a more reliable way of making sure the echo is reviewed per Dr Jens Som, as I likely would not remember to do this

## 2011-01-18 NOTE — Assessment & Plan Note (Signed)
Blood pressure mildly elevated. However she states it is controlled at home. Continue present medications.

## 2011-01-18 NOTE — Assessment & Plan Note (Signed)
Continue statin. 

## 2011-01-18 NOTE — Patient Instructions (Signed)
Your physician has requested that you have an echocardiogram. Echocardiography is a painless test that uses sound waves to create images of your heart. It provides your doctor with information about the size and shape of your heart and how well your heart's chambers and valves are working. This procedure takes approximately one hour. There are no restrictions for this procedure.  Your physician wants you to follow-up in: 12 months You will receive a reminder letter in the mail two months in advance. If you don't receive a letter, please call our office to schedule the follow-up appointment.  

## 2011-01-18 NOTE — Assessment & Plan Note (Signed)
Status post mitral valve repair. Also with history of mild to moderate aortic insufficiency. Continued SBE prophylaxis. Repeat echocardiogram. She has mild dyspnea on exertion and occasional mild pedal edema. I have given her a prescription for Lasix 20 mg daily p.r.n.

## 2011-01-18 NOTE — Progress Notes (Signed)
HPI:Alexis Cobb is a very pleasant  female who has a history of mitral valve repair secondary to mitral regurgitation.  She has also had a previous pacemaker placed and history of nonobstructive coronary disease by catheterization in 2003. She had an echocardiogram performed in July of 2011  that showed normal LV function.  She is status post mitral valve repair and there was mild mitral regurgitation; mild biatrial enlargement; trace AI; moderate TR.  She also had a Myoview performed on January 16, 2008.  Her ejection fraction of 78% and there was no ischemia or infarction.  I last saw her in July of 2011. Since then she has had some difficulties with her blood pressure medications. Apparently may have been adjusted for a rash and she also had some dyspnea. She has dyspnea with more extreme activities but no orthopnea or PND. No chest pain or syncope. Occasional mild pedal edema.  Current Outpatient Prescriptions  Medication Sig Dispense Refill  . aspirin 81 MG tablet Take 81 mg by mouth daily.        . Calcium Carbonate-Vitamin D (CALCIUM + D PO) Take by mouth daily.        . Cholecalciferol (VITAMIN D) 1000 UNITS capsule Take 1,000 Units by mouth daily.        . clindamycin (CLEOCIN) 300 MG capsule Take by mouth. Take as directed for dental procedures as needed      . losartan (COZAAR) 50 MG tablet Take 1 tablet (50 mg total) by mouth daily.  30 tablet  11  . metoprolol (TOPROL-XL) 50 MG 24 hr tablet Take 1.5 tablets (75 mg total) by mouth daily.  45 tablet  6  . MEVACOR 40 MG tablet TAKE 1 TABLET EACH DAY.  30 each  11  . Multiple Vitamins-Minerals (CEROVITE PO) 1 tab po qd          Past Medical History  Diagnosis Date  . Hyperlipidemia   . HTN (hypertension)   . Arthritis   . ARTHRITIS, RHEUMATOID, HX OF 01/06/2007  . MITRAL REGURGITATION 01/06/2007  . OSTEOARTHRITIS 01/14/2007  . OSTEOPOROSIS 01/14/2007  . Other specified forms of hearing loss 08/01/2009  . PACEMAKER, PERMANENT  01/06/2007  . Raynaud's syndrome 01/06/2007    Past Surgical History  Procedure Date  . Mitral valve repair     History   Social History  . Marital Status: Widowed    Spouse Name: N/A    Number of Children: N/A  . Years of Education: N/A   Occupational History  . retired Catering manager    Social History Main Topics  . Smoking status: Never Smoker   . Smokeless tobacco: Not on file  . Alcohol Use: No  . Drug Use: No  . Sexually Active: Not on file   Other Topics Concern  . Not on file   Social History Narrative  . No narrative on file    ROS: no fevers or chills, productive cough, hemoptysis, dysphasia, odynophagia, melena, hematochezia, dysuria, hematuria, rash, seizure activity, orthopnea, PND, pedal edema, claudication. Remaining systems are negative.  Physical Exam: Well-developed well-nourished in no acute distress.  Skin is warm and dry.  HEENT is normal.  Neck is supple. No thyromegaly.  Chest is clear to auscultation with normal expansion.  Cardiovascular exam is regular rate and rhythm.  Abdominal exam nontender or distended. No masses palpated. Extremities show trace to 1+ edema. neuro grossly intact  ECG AV paced

## 2011-01-18 NOTE — Assessment & Plan Note (Signed)
Management her electrophysiology.

## 2011-01-18 NOTE — Telephone Encounter (Signed)
[facility] called to inform MD that Dr Jens Som would like to receive results of pt's upcoming ECHO.

## 2011-01-20 ENCOUNTER — Encounter: Payer: Self-pay | Admitting: *Deleted

## 2011-01-27 ENCOUNTER — Other Ambulatory Visit (HOSPITAL_COMMUNITY): Payer: Medicare Other | Admitting: Radiology

## 2011-01-28 ENCOUNTER — Other Ambulatory Visit (HOSPITAL_COMMUNITY): Payer: Self-pay | Admitting: Radiology

## 2011-01-28 DIAGNOSIS — I340 Nonrheumatic mitral (valve) insufficiency: Secondary | ICD-10-CM

## 2011-02-01 ENCOUNTER — Ambulatory Visit (HOSPITAL_COMMUNITY): Payer: Medicare Other | Attending: Cardiovascular Disease | Admitting: Radiology

## 2011-02-01 DIAGNOSIS — I2510 Atherosclerotic heart disease of native coronary artery without angina pectoris: Secondary | ICD-10-CM | POA: Insufficient documentation

## 2011-02-01 DIAGNOSIS — M7989 Other specified soft tissue disorders: Secondary | ICD-10-CM | POA: Insufficient documentation

## 2011-02-01 DIAGNOSIS — I079 Rheumatic tricuspid valve disease, unspecified: Secondary | ICD-10-CM | POA: Insufficient documentation

## 2011-02-01 DIAGNOSIS — I059 Rheumatic mitral valve disease, unspecified: Secondary | ICD-10-CM | POA: Insufficient documentation

## 2011-02-01 DIAGNOSIS — I080 Rheumatic disorders of both mitral and aortic valves: Secondary | ICD-10-CM

## 2011-02-01 DIAGNOSIS — R0989 Other specified symptoms and signs involving the circulatory and respiratory systems: Secondary | ICD-10-CM

## 2011-02-01 DIAGNOSIS — I319 Disease of pericardium, unspecified: Secondary | ICD-10-CM | POA: Insufficient documentation

## 2011-02-01 DIAGNOSIS — R0609 Other forms of dyspnea: Secondary | ICD-10-CM | POA: Insufficient documentation

## 2011-02-10 LAB — BASIC METABOLIC PANEL
Calcium: 9.4
GFR calc Af Amer: >60
GFR calc non Af Amer: 51 — ABNORMAL LOW
Glucose, Bld: 83
Potassium: 3.3 — ABNORMAL LOW
Sodium: 140

## 2011-02-10 LAB — CARDIAC PANEL(CRET KIN+CKTOT+MB+TROPI)
Relative Index: INVALID
Troponin I: <0.01

## 2011-02-10 LAB — LIPID PANEL
HDL: 44
Total CHOL/HDL Ratio: 3
VLDL: 18

## 2011-02-10 LAB — CBC
HCT: 39
Hemoglobin: 12.9
MCV: 98.2
RDW: 13.9
WBC: 6.7

## 2011-02-10 LAB — COMPREHENSIVE METABOLIC PANEL
Alkaline Phosphatase: 78
BUN: 11
Chloride: 104
GFR calc non Af Amer: >60
Glucose, Bld: 90
Potassium: 3.3 — ABNORMAL LOW
Total Bilirubin: 0.7

## 2011-02-10 LAB — PROTIME-INR: INR: 1.1

## 2011-02-10 LAB — TSH: TSH: 1.816

## 2011-02-10 LAB — B-NATRIURETIC PEPTIDE (CONVERTED LAB): Pro B Natriuretic peptide (BNP): 199 — ABNORMAL HIGH

## 2011-02-10 LAB — APTT: aPTT: 29

## 2011-03-25 ENCOUNTER — Encounter: Payer: Self-pay | Admitting: Internal Medicine

## 2011-03-25 ENCOUNTER — Other Ambulatory Visit: Payer: Self-pay | Admitting: Internal Medicine

## 2011-03-25 ENCOUNTER — Ambulatory Visit (INDEPENDENT_AMBULATORY_CARE_PROVIDER_SITE_OTHER): Payer: Medicare Other | Admitting: *Deleted

## 2011-03-25 DIAGNOSIS — Z950 Presence of cardiac pacemaker: Secondary | ICD-10-CM

## 2011-03-25 DIAGNOSIS — I495 Sick sinus syndrome: Secondary | ICD-10-CM

## 2011-03-26 LAB — REMOTE PACEMAKER DEVICE
AL AMPLITUDE: 2 mv
AL THRESHOLD: 0.5 V
BAMS-0001: 175 {beats}/min
RV LEAD IMPEDENCE PM: 403 Ohm
VENTRICULAR PACING PM: 97

## 2011-03-31 NOTE — Progress Notes (Signed)
Remote pacer check  

## 2011-04-15 ENCOUNTER — Encounter: Payer: Self-pay | Admitting: *Deleted

## 2011-04-21 ENCOUNTER — Ambulatory Visit: Payer: Medicare Other

## 2011-04-29 ENCOUNTER — Other Ambulatory Visit: Payer: Self-pay

## 2011-04-29 MED ORDER — METOPROLOL SUCCINATE ER 50 MG PO TB24: 75.0000 mg | ORAL_TABLET | Freq: Every day | ORAL | Status: DC

## 2011-07-01 ENCOUNTER — Encounter: Payer: Self-pay | Admitting: Internal Medicine

## 2011-07-01 ENCOUNTER — Ambulatory Visit (INDEPENDENT_AMBULATORY_CARE_PROVIDER_SITE_OTHER): Payer: Medicare Other | Admitting: *Deleted

## 2011-07-01 DIAGNOSIS — Z950 Presence of cardiac pacemaker: Secondary | ICD-10-CM

## 2011-07-01 DIAGNOSIS — I495 Sick sinus syndrome: Secondary | ICD-10-CM

## 2011-07-02 LAB — REMOTE PACEMAKER DEVICE
ATRIAL PACING PM: 74
BAMS-0001: 175 {beats}/min
BATTERY VOLTAGE: 2.78 V
RV LEAD IMPEDENCE PM: 407 Ohm
VENTRICULAR PACING PM: 98

## 2011-07-05 ENCOUNTER — Ambulatory Visit (INDEPENDENT_AMBULATORY_CARE_PROVIDER_SITE_OTHER): Payer: Medicare Other | Admitting: Internal Medicine

## 2011-07-05 ENCOUNTER — Encounter: Payer: Self-pay | Admitting: Internal Medicine

## 2011-07-05 VITALS — BP 140/80 | HR 88 | Temp 97.0°F | Ht 63.0 in | Wt 117.2 lb

## 2011-07-05 DIAGNOSIS — Z0000 Encounter for general adult medical examination without abnormal findings: Secondary | ICD-10-CM

## 2011-07-05 DIAGNOSIS — I080 Rheumatic disorders of both mitral and aortic valves: Secondary | ICD-10-CM

## 2011-07-05 DIAGNOSIS — M810 Age-related osteoporosis without current pathological fracture: Secondary | ICD-10-CM

## 2011-07-05 DIAGNOSIS — E785 Hyperlipidemia, unspecified: Secondary | ICD-10-CM

## 2011-07-05 DIAGNOSIS — I10 Essential (primary) hypertension: Secondary | ICD-10-CM

## 2011-07-05 DIAGNOSIS — M1990 Unspecified osteoarthritis, unspecified site: Secondary | ICD-10-CM

## 2011-07-05 NOTE — Patient Instructions (Signed)
Continue all other medications as before No blood work needed today Please keep your appointments with your specialists as you have planned - Dr Dierdre Forth next month, and Dr Jens Som Please return in 6 mo with Lab testing done 3-5 days before

## 2011-07-08 NOTE — Progress Notes (Signed)
Remote pacer check  

## 2011-07-11 ENCOUNTER — Encounter: Payer: Self-pay | Admitting: Internal Medicine

## 2011-07-11 NOTE — Assessment & Plan Note (Signed)
stable overall by hx and exam, most recent data reviewed with pt, and pt to continue medical treatment as before BP Readings from Last 3 Encounters:  07/05/11 140/80  01/18/11 150/80  12/31/10 120/82

## 2011-07-11 NOTE — Assessment & Plan Note (Signed)
stable overall by hx and exam, volume stable, follows with Dr Jerrye Beavers, and pt to continue medical treatment as before

## 2011-07-11 NOTE — Assessment & Plan Note (Signed)
Also followed closely with Dr Christy Gentles,  to f/u any worsening symptoms or concerns

## 2011-07-11 NOTE — Assessment & Plan Note (Signed)
stable overall by hx and exam, most recent data reviewed with pt, and pt to continue medical treatment as before  Lab Results  Component Value Date   LDLCALC 89 01/05/2011

## 2011-07-11 NOTE — Progress Notes (Signed)
Subjective:    Patient ID: Alexis Cobb, female    DOB: 03-Dec-1924, 78 y.o.   MRN: [id number]  HPI  Here to f/u; overall doing ok,  Pt denies chest pain, increased sob or doe, wheezing, orthopnea, PND, increased LE swelling, palpitations,  or syncope but does have some dizziness to getting up too quickly. .  Pt denies new neurological symptoms such as new headache, or facial or extremity weakness or numbness   Pt denies polydipsia, polyuria, or low sugar symptoms such as weakness or confusion improved with po intake.  Pt states overall good compliance with meds, trying to follow lower cholesterol, diabetic diet, wt overall stable, has several achy joints, c/o mild general weakness, does not feel she needs PT. No falls, no injury.   Pt denies fever, wt loss, night sweats, loss of appetite, or other constitutional symptoms.  Taking lasix prn only.  No other acute complaints Past Medical History  Diagnosis Date  . Hyperlipidemia   . HTN (hypertension)   . Arthritis   . ARTHRITIS, RHEUMATOID, HX OF 01/06/2007  . MITRAL REGURGITATION 01/06/2007  . OSTEOARTHRITIS 01/14/2007  . OSTEOPOROSIS 01/14/2007  . Other specified forms of hearing loss 08/01/2009  . PACEMAKER, PERMANENT 01/06/2007  . Raynaud's syndrome 01/06/2007   Past Surgical History  Procedure Date  . Mitral valve repair     reports that she has never smoked. She does not have any smokeless tobacco history on file. She reports that she does not drink alcohol or use illicit drugs. family history includes Coronary artery disease in an unspecified family member and Hypertension in an unspecified family member. Allergies  Allergen Reactions  . Cephalexin   . Penicillins   . Risedronate Sodium   . Ace Inhibitors Rash   Current Outpatient Prescriptions on File Prior to Visit  Medication Sig Dispense Refill  . aspirin 81 MG tablet Take 81 mg by mouth daily.        . Calcium Carbonate-Vitamin D (CALCIUM + D PO) Take by mouth daily.          . Cholecalciferol (VITAMIN D) 1000 UNITS capsule Take 1,000 Units by mouth daily.        . clindamycin (CLEOCIN) 300 MG capsule Take by mouth. Take as directed for dental procedures as needed      . furosemide (LASIX) 20 MG tablet Take one per day only as needed for swelling in feet/ankles.  30 tablet  11  . losartan (COZAAR) 50 MG tablet Take 1 tablet (50 mg total) by mouth daily.  30 tablet  11  . metoprolol (TOPROL-XL) 50 MG 24 hr tablet Take 1.5 tablets (75 mg total) by mouth daily.  45 tablet  9  . MEVACOR 40 MG tablet TAKE 1 TABLET EACH DAY.  30 each  11  . Multiple Vitamins-Minerals (CEROVITE PO) 1 tab po qd        Review of Systems Review of Systems  Constitutional: Negative for diaphoresis and unexpected weight change.  HENT: Negative for drooling and tinnitus.   Eyes: Negative for photophobia and visual disturbance.  Respiratory: Negative for choking and stridor.   Gastrointestinal: Negative for vomiting and blood in stool.  Genitourinary: Negative for hematuria and decreased urine volume.  Musculoskeletal: Negative for gait problem.  Skin: Negative for color change and wound.    Objective:   Physical Exam BP 140/80  Pulse 88  Temp(Src) 97 F (36.1 C) (Oral)  Ht 5\' 3"  (1.6 m)  Wt 117 lb 4 oz (  53.184 kg)  BMI 20.77 kg/m2  SpO2 97% Physical Exam  VS noted Constitutional: Pt appears well-developed and well-nourished.  HENT: Head: Normocephalic.  Right Ear: External ear normal.  Left Ear: External ear normal.  Eyes: Conjunctivae and EOM are normal. Pupils are equal, round, and reactive to light.  Neck: Normal range of motion. Neck supple.  Cardiovascular: Normal rate and regular rhythm.   Pulmonary/Chest: Effort normal and breath sounds normal.  Abd:  Soft, NT, non-distended, + BS Neurological: Pt is alert. No cranial nerve deficit.  Skin: Skin is warm. No erythema. Trace left pedal edema only Psychiatric: Pt behavior is normal. Thought content normal.     Assessment  & Plan:

## 2011-07-11 NOTE — Assessment & Plan Note (Signed)
stable overall by hx and exam, most recent data reviewed with pt, and pt to continue medical treatment as before  Lab Results  Component Value Date   WBC 5.9 01/05/2011   HGB 13.6 01/05/2011   HCT 40.8 01/05/2011   PLT 235.0 01/05/2011   GLUCOSE 87 01/05/2011   CHOL 182 01/05/2011   TRIG 69.0 01/05/2011   HDL 79.50 01/05/2011   LDLCALC 89 01/05/2011   ALT 17 01/05/2011   AST 26 01/05/2011   NA 145 01/05/2011   K 3.8 01/05/2011   CL 108 01/05/2011   CREATININE 0.7 01/05/2011   BUN 15 01/05/2011   CO2 28 01/05/2011   TSH 1.24 01/05/2011   INR 1.1 ratio* 08/29/2008

## 2011-08-09 ENCOUNTER — Encounter: Payer: Self-pay | Admitting: *Deleted

## 2011-11-23 ENCOUNTER — Encounter: Payer: Self-pay | Admitting: Internal Medicine

## 2011-11-23 ENCOUNTER — Ambulatory Visit (INDEPENDENT_AMBULATORY_CARE_PROVIDER_SITE_OTHER): Payer: Medicare Other | Admitting: Internal Medicine

## 2011-11-23 VITALS — BP 169/91 | HR 83 | Ht 64.0 in | Wt 115.0 lb

## 2011-11-23 DIAGNOSIS — I495 Sick sinus syndrome: Secondary | ICD-10-CM

## 2011-11-23 DIAGNOSIS — I10 Essential (primary) hypertension: Secondary | ICD-10-CM

## 2011-11-23 DIAGNOSIS — Z950 Presence of cardiac pacemaker: Secondary | ICD-10-CM

## 2011-11-23 LAB — PACEMAKER DEVICE OBSERVATION
AL AMPLITUDE: 1 mv
AL THRESHOLD: 0.5 V
BAMS-0001: 175 {beats}/min
RV LEAD AMPLITUDE: 5.6 mv
RV LEAD THRESHOLD: 1.125 V

## 2011-11-23 MED ORDER — NITROGLYCERIN 0.4 MG SL SUBL: 0.4000 mg | SUBLINGUAL_TABLET | SUBLINGUAL | Status: DC | PRN

## 2011-11-23 NOTE — Assessment & Plan Note (Signed)
Her Medtronic device is working normally. We'll plan to recheck in several months.

## 2011-11-23 NOTE — Progress Notes (Signed)
HPI Alexis Cobb returns today for followup. She is a very pleasant 78 year old woman with a history of symptomatic bradycardia, status post permanent pacemaker insertion. She is also had difficult to control hypertension. She denies chest pain or shortness of breath. No syncope. In the interim, she has done well. She admits to problems with hypertension. She denies medical or dietary noncompliance. Allergies  Allergen Reactions  . Cephalexin   . Penicillins   . Risedronate Sodium   . Ace Inhibitors Rash     Current Outpatient Prescriptions  Medication Sig Dispense Refill  . aspirin 81 MG tablet Take 81 mg by mouth daily.        . Calcium Carbonate-Vitamin D (CALCIUM + D PO) Take by mouth daily.        . Cholecalciferol (VITAMIN D) 1000 UNITS capsule Take 1,000 Units by mouth daily.        . clindamycin (CLEOCIN) 300 MG capsule Take by mouth. Take as directed for dental procedures as needed      . furosemide (LASIX) 20 MG tablet Take one per day only as needed for swelling in feet/ankles.  30 tablet  11  . losartan (COZAAR) 50 MG tablet Take 1 tablet (50 mg total) by mouth daily.  30 tablet  11  . lovastatin (MEVACOR) 40 MG tablet Take 40 mg by mouth at bedtime.      . metoprolol (TOPROL-XL) 50 MG 24 hr tablet Take 1.5 tablets (75 mg total) by mouth daily.  45 tablet  9  . Multiple Vitamins-Minerals (CEROVITE PO) 1 tab po qd       . nitroGLYCERIN (NITROSTAT) 0.4 MG SL tablet Place 0.4 mg under the tongue every 5 (five) minutes as needed.      Marland Kitchen DISCONTD: MEVACOR 40 MG tablet TAKE 1 TABLET EACH DAY.  30 each  11     Past Medical History  Diagnosis Date  . Hyperlipidemia   . HTN (hypertension)   . Arthritis   . ARTHRITIS, RHEUMATOID, HX OF 01/06/2007  . MITRAL REGURGITATION 01/06/2007  . OSTEOARTHRITIS 01/14/2007  . OSTEOPOROSIS 01/14/2007  . Other specified forms of hearing loss 08/01/2009  . PACEMAKER, PERMANENT 01/06/2007  . Raynaud's syndrome 01/06/2007    ROS:   All systems  reviewed and negative except as noted in the HPI.   Past Surgical History  Procedure Date  . Mitral valve repair      Family History  Problem Relation Age of Onset  . Coronary artery disease    . Hypertension       History   Social History  . Marital Status: Widowed    Spouse Name: N/A    Number of Children: N/A  . Years of Education: N/A   Occupational History  . retired Catering manager    Social History Main Topics  . Smoking status: Never Smoker   . Smokeless tobacco: Not on file  . Alcohol Use: No  . Drug Use: No  . Sexually Active: Not on file   Other Topics Concern  . Not on file   Social History Narrative  . No narrative on file     BP 169/91  Pulse 83  Ht 5\' 4"  (1.626 m)  Wt 115 lb (52.164 kg)  BMI 19.74 kg/m2  Physical Exam:  Well appearing elderly woman, NAD HEENT: Unremarkable Neck:  No JVD, no thyromegally Lungs:  Clear with no wheezes, rales, or rhonchi. HEART:  Regular rate rhythm, no murmurs, no rubs, no clicks, a soft S4 is  present. Abd:  soft, positive bowel sounds, no organomegally, no rebound, no guarding Ext:  2 plus pulses, no edema, no cyanosis, no clubbing Skin:  No rashes no nodules Neuro:  CN II through XII intact, motor grossly intact  DEVICE  Normal device function.  See PaceArt for details.   Assess/Plan:

## 2011-11-23 NOTE — Assessment & Plan Note (Signed)
Her blood pressure is not well controlled. I discussed the treatment options with the patient. I would like to switch her from Toprol to carvedilol. Dr. Jens Som in our practice is her primary cardiologist and after much reflection and discussion, I will defer additional antihypertensive medical therapy to him. She will see him back in a couple of weeks. She is instructed to reduce her salt intake.

## 2011-11-23 NOTE — Patient Instructions (Signed)
Your physician wants you to follow-up in: 12 months with Dr Taylor You will receive a reminder letter in the mail two months in advance. If you don't receive a letter, please call our office to schedule the follow-up appointment.   Remote monitoring is used to monitor your Pacemaker of ICD from home. This monitoring reduces the number of office visits required to check your device to one time per year. It allows us to keep an eye on the functioning of your device to ensure it is working properly. You are scheduled for a device check from home on 02/28/2012. You may send your transmission at any time that day. If you have a wireless device, the transmission will be sent automatically. After your physician reviews your transmission, you will receive a postcard with your next transmission date.   

## 2012-01-04 ENCOUNTER — Other Ambulatory Visit (INDEPENDENT_AMBULATORY_CARE_PROVIDER_SITE_OTHER): Payer: Medicare Other

## 2012-01-04 ENCOUNTER — Ambulatory Visit (INDEPENDENT_AMBULATORY_CARE_PROVIDER_SITE_OTHER): Payer: Medicare Other | Admitting: Internal Medicine

## 2012-01-04 ENCOUNTER — Encounter: Payer: Self-pay | Admitting: Internal Medicine

## 2012-01-04 ENCOUNTER — Other Ambulatory Visit: Payer: Self-pay

## 2012-01-04 VITALS — BP 140/90 | HR 90 | Temp 97.3°F | Ht 63.0 in | Wt 116.4 lb

## 2012-01-04 DIAGNOSIS — I10 Essential (primary) hypertension: Secondary | ICD-10-CM

## 2012-01-04 DIAGNOSIS — Z0000 Encounter for general adult medical examination without abnormal findings: Secondary | ICD-10-CM

## 2012-01-04 DIAGNOSIS — E785 Hyperlipidemia, unspecified: Secondary | ICD-10-CM

## 2012-01-04 LAB — HEPATIC FUNCTION PANEL
ALT: 12 U/L (ref 0–35)
AST: 25 U/L (ref 0–37)
Alkaline Phosphatase: 58 U/L (ref 39–117)
Bilirubin, Direct: 0.1 mg/dL (ref 0.0–0.3)
Total Protein: 6.9 g/dL (ref 6.0–8.3)

## 2012-01-04 LAB — TSH: TSH: 1.16 u[IU]/mL (ref 0.35–5.50)

## 2012-01-04 LAB — CBC WITH DIFFERENTIAL/PLATELET
Basophils Absolute: 0 10*3/uL (ref 0.0–0.1)
Eosinophils Absolute: 0.2 10*3/uL (ref 0.0–0.7)
Hemoglobin: 13.7 g/dL (ref 12.0–15.0)
Lymphocytes Relative: 31.1 % (ref 12.0–46.0)
MCHC: 32.3 g/dL (ref 30.0–36.0)
Neutro Abs: 4.2 10*3/uL (ref 1.4–7.7)
Platelets: 225 10*3/uL (ref 150.0–400.0)
RDW: 14.6 % (ref 11.5–14.6)

## 2012-01-04 LAB — BASIC METABOLIC PANEL
CO2: 29 mEq/L (ref 19–32)
Calcium: 10.4 mg/dL (ref 8.4–10.5)
Chloride: 106 mEq/L (ref 96–112)
Potassium: 4.2 mEq/L (ref 3.5–5.1)
Sodium: 142 mEq/L (ref 135–145)

## 2012-01-04 LAB — URINALYSIS, ROUTINE W REFLEX MICROSCOPIC
Bilirubin Urine: NEGATIVE
Ketones, ur: NEGATIVE
Leukocytes, UA: NEGATIVE
Specific Gravity, Urine: 1.02 (ref 1.000–1.030)
Urobilinogen, UA: 0.2 (ref 0.0–1.0)

## 2012-01-04 LAB — LIPID PANEL: Total CHOL/HDL Ratio: 2

## 2012-01-04 MED ORDER — LOSARTAN POTASSIUM 50 MG PO TABS: 50.0000 mg | ORAL_TABLET | Freq: Every day | ORAL | Status: DC

## 2012-01-04 MED ORDER — LOVASTATIN 40 MG PO TABS: 40.0000 mg | ORAL_TABLET | Freq: Every day | ORAL | Status: DC

## 2012-01-04 NOTE — Assessment & Plan Note (Signed)

## 2012-01-04 NOTE — Progress Notes (Signed)
Subjective:    Patient ID: Alexis Cobb, female    DOB: May 18, 1924, 78 y.o.   MRN: [id number]  HPI  Here for wellness and f/u;  Overall doing ok;  Pt denies CP, worsening SOB, DOE, wheezing, orthopnea, PND, worsening LE edema, palpitations, dizziness or syncope.  Pt denies neurological change such as new Headache, facial or extremity weakness.  Pt denies polydipsia, polyuria, or low sugar symptoms. Pt states overall good compliance with treatment and medications, good tolerability, and trying to follow lower cholesterol diet.  Pt denies worsening depressive symptoms, suicidal ideation or panic. No fever, wt loss, night sweats, loss of appetite, or other constitutional symptoms.  Pt states good ability with ADL's, low fall risk, home safety reviewed and adequate, no significant changes in hearing or vision, and occasionally active with exercise.  Does have sense of ongoing fatigue, but denies signficant hypersomnolence.  Has some mild memory slowness compared to last yr.  Not sure why, but has more energy some days than others.  Has had some ongoing stress, but Denies worsening depressive symptoms, suicidal ideation, or panic.  No overall pain, has not seen rheum recently, no recent prednisone. Past Medical History  Diagnosis Date  . Hyperlipidemia   . HTN (hypertension)   . Arthritis   . ARTHRITIS, RHEUMATOID, HX OF 01/06/2007  . MITRAL REGURGITATION 01/06/2007  . OSTEOARTHRITIS 01/14/2007  . OSTEOPOROSIS 01/14/2007  . Other specified forms of hearing loss 08/01/2009  . PACEMAKER, PERMANENT 01/06/2007  . Raynaud's syndrome 01/06/2007   Past Surgical History  Procedure Date  . Mitral valve repair     reports that she has never smoked. She does not have any smokeless tobacco history on file. She reports that she does not drink alcohol or use illicit drugs. family history includes Coronary artery disease in an unspecified family member and Hypertension in an unspecified family member. Allergies    Allergen Reactions  . Cephalexin   . Penicillins   . Risedronate Sodium   . Ace Inhibitors Rash   Current Outpatient Prescriptions on File Prior to Visit  Medication Sig Dispense Refill  . aspirin 81 MG tablet Take 81 mg by mouth daily.        . Calcium Carbonate-Vitamin D (CALCIUM + D PO) Take by mouth daily.        . Cholecalciferol (VITAMIN D) 1000 UNITS capsule Take 1,000 Units by mouth daily.        . furosemide (LASIX) 20 MG tablet Take one per day only as needed for swelling in feet/ankles.  30 tablet  11  . metoprolol (TOPROL-XL) 50 MG 24 hr tablet Take 1.5 tablets (75 mg total) by mouth daily.  45 tablet  9  . Multiple Vitamins-Minerals (CEROVITE PO) 1 tab po qd       . nitroGLYCERIN (NITROSTAT) 0.4 MG SL tablet Place 1 tablet (0.4 mg total) under the tongue every 5 (five) minutes as needed.  25 tablet  12  . clindamycin (CLEOCIN) 300 MG capsule Take by mouth. Take as directed for dental procedures as needed      . losartan (COZAAR) 50 MG tablet Take 1 tablet (50 mg total) by mouth daily.  30 tablet  11  . DISCONTD: lovastatin (MEVACOR) 40 MG tablet Take 40 mg by mouth at bedtime.       Review of Systems Review of Systems  Constitutional: Negative for diaphoresis, activity change, appetite change and unexpected weight change.  HENT: Negative for hearing loss, ear pain, facial swelling, mouth  sores and neck stiffness.   Eyes: Negative for pain, redness and visual disturbance.  Respiratory: Negative for shortness of breath and wheezing.   Cardiovascular: Negative for chest pain and palpitations.  Gastrointestinal: Negative for diarrhea, blood in stool, abdominal distention and rectal pain.  Genitourinary: Negative for hematuria, flank pain and decreased urine volume.  Musculoskeletal: Negative for myalgias and joint swelling.  Skin: Negative for color change and wound.  Neurological: Negative for syncope and numbness.  Hematological: Negative for adenopathy.   Psychiatric/Behavioral: Negative for hallucinations, self-injury, decreased concentration and agitation.     Objective:   Physical Exam BP 140/90  Pulse 90  Temp 97.3 F (36.3 C) (Oral)  Ht 5\' 3"  (1.6 m)  Wt 116 lb 6 oz (52.787 kg)  BMI 20.61 kg/m2  SpO2 98% Physical Exam  VS noted Constitutional: Pt is oriented to person, place, and time. Appears thin, frail Head: Normocephalic and atraumatic.  Right Ear: External ear normal.  Left Ear: External ear normal.  Nose: Nose normal.  Mouth/Throat: Oropharynx is clear and moist.  Eyes: Conjunctivae and EOM are normal. Pupils are equal, round, and reactive to light.  Neck: Normal range of motion. Neck supple. No JVD present. No tracheal deviation present.  Cardiovascular: Normal rate, regular rhythm, normal heart sounds and intact distal pulses.   Pulmonary/Chest: Effort normal and breath sounds normal.  Abdominal: Soft. Bowel sounds are normal. There is no tenderness.  Musculoskeletal: Normal range of motion. Exhibits no edema.  Lymphadenopathy:  Has no cervical adenopathy.  Neurological: Pt is alert and oriented to person, place, and time. Pt has normal reflexes. No cranial nerve deficit. Motor/gait intact; head tremor noted Skin: Skin is warm and dry. No rash noted.  Psychiatric:  Has  normal mood and affect. Behavior is normal.     Assessment & Plan:

## 2012-01-04 NOTE — Patient Instructions (Addendum)
Continue all other medications as before Your losartan was refilled today Please keep your appointments with your specialists as you have planned - Dr Jens Som in September Please go to [facility] in the [facility] for the blood and/or urine tests to be done today You will be contacted by phone if any changes need to be made immediately.  Otherwise, you will receive a letter about your results with an explanation. Please return in 6 months, or sooner if needed

## 2012-02-28 ENCOUNTER — Encounter: Payer: Medicare Other | Admitting: *Deleted

## 2012-03-03 ENCOUNTER — Encounter: Payer: Self-pay | Admitting: *Deleted

## 2012-03-09 ENCOUNTER — Ambulatory Visit (INDEPENDENT_AMBULATORY_CARE_PROVIDER_SITE_OTHER): Payer: Medicare Other | Admitting: *Deleted

## 2012-03-09 DIAGNOSIS — Z950 Presence of cardiac pacemaker: Secondary | ICD-10-CM

## 2012-03-09 DIAGNOSIS — I495 Sick sinus syndrome: Secondary | ICD-10-CM

## 2012-03-10 ENCOUNTER — Other Ambulatory Visit: Payer: Self-pay | Admitting: *Deleted

## 2012-03-10 MED ORDER — METOPROLOL SUCCINATE ER 50 MG PO TB24: ORAL_TABLET | ORAL | Status: DC

## 2012-03-21 LAB — REMOTE PACEMAKER DEVICE
AL THRESHOLD: 0.5 V
BATTERY VOLTAGE: 2.78 V
VENTRICULAR PACING PM: 100

## 2012-03-28 ENCOUNTER — Encounter: Payer: Self-pay | Admitting: *Deleted

## 2012-04-10 ENCOUNTER — Encounter: Payer: Self-pay | Admitting: Internal Medicine

## 2012-05-22 ENCOUNTER — Encounter: Payer: Self-pay | Admitting: Cardiology

## 2012-05-22 ENCOUNTER — Ambulatory Visit (INDEPENDENT_AMBULATORY_CARE_PROVIDER_SITE_OTHER): Payer: Medicare Other | Admitting: Cardiology

## 2012-05-22 VITALS — BP 162/83 | HR 68 | Wt 170.0 lb

## 2012-05-22 DIAGNOSIS — I10 Essential (primary) hypertension: Secondary | ICD-10-CM

## 2012-05-22 DIAGNOSIS — I059 Rheumatic mitral valve disease, unspecified: Secondary | ICD-10-CM

## 2012-05-22 MED ORDER — LOSARTAN POTASSIUM 100 MG PO TABS: 100.0000 mg | ORAL_TABLET | Freq: Every day | ORAL | Status: DC

## 2012-05-22 NOTE — Progress Notes (Signed)
HPI: Alexis Cobb is a very pleasant female who has a history of mitral valve repair secondary to mitral regurgitation. She has also had a previous pacemaker placed and history of nonobstructive coronary disease by catheterization in 2003. She also had a Myoview performed on January 16, 2008. Her ejection fraction of 78% and there was no ischemia or infarction. Last echocardiogram in September of 2012 showed normal LV function, prior mitral valve repair with mild mitral regurgitation and mild to moderate tricuspid regurgitation. I last saw her in Sept 2012. Since then she has dyspnea with more moderate activities but not routine activities. No orthopnea, PND, pedal edema, chest pain or syncope.   Current Outpatient Prescriptions  Medication Sig Dispense Refill  . aspirin 81 MG tablet Take 81 mg by mouth daily.        . Calcium Carbonate-Vitamin D (CALCIUM + D PO) Take by mouth daily.        . Cholecalciferol (VITAMIN D) 1000 UNITS capsule Take 1,000 Units by mouth daily.        . clindamycin (CLEOCIN) 300 MG capsule Take by mouth. Take as directed for dental procedures as needed      . furosemide (LASIX) 20 MG tablet Take one per day only as needed for swelling in feet/ankles.  30 tablet  11  . losartan (COZAAR) 50 MG tablet Take 1 tablet (50 mg total) by mouth daily.  90 tablet  3  . lovastatin (MEVACOR) 40 MG tablet Take 1 tablet (40 mg total) by mouth at bedtime.  30 tablet  11  . metoprolol succinate (TOPROL-XL) 50 MG 24 hr tablet Take 1.5 tablets (75 mg total) by mouth daily.  45 tablet  9  . Multiple Vitamins-Minerals (CEROVITE PO) 1 tab po qd       . nitroGLYCERIN (NITROSTAT) 0.4 MG SL tablet Place 1 tablet (0.4 mg total) under the tongue every 5 (five) minutes as needed.  25 tablet  12     Past Medical History  Diagnosis Date  . Hyperlipidemia   . HTN (hypertension)   . Arthritis   . ARTHRITIS, RHEUMATOID, HX OF 01/06/2007  . MITRAL REGURGITATION 01/06/2007  . OSTEOARTHRITIS  01/14/2007  . OSTEOPOROSIS 01/14/2007  . Other specified forms of hearing loss 08/01/2009  . PACEMAKER, PERMANENT 01/06/2007  . Raynaud's syndrome 01/06/2007    Past Surgical History  Procedure Date  . Mitral valve repair     History   Social History  . Marital Status: Widowed    Spouse Name: N/A    Number of Children: N/A  . Years of Education: N/A   Occupational History  . retired Catering manager    Social History Main Topics  . Smoking status: Never Smoker   . Smokeless tobacco: Never Used  . Alcohol Use: No  . Drug Use: No  . Sexually Active: Not on file   Other Topics Concern  . Not on file   Social History Narrative  . No narrative on file    ROS: no fevers or chills, productive cough, hemoptysis, dysphasia, odynophagia, melena, hematochezia, dysuria, hematuria, rash, seizure activity, orthopnea, PND, pedal edema, claudication. Remaining systems are negative.  Physical Exam: Well-developed well-nourished in no acute distress.  Skin is warm and dry.  HEENT is normal.  Neck is supple.  Chest is clear to auscultation with normal expansion.  Cardiovascular exam is regular rate and rhythm. 2/6 systolic murmur apex. Abdominal exam nontender or distended. No masses palpated. Extremities show trace edema. neuro grossly intact  ECG AV sequential pacemaker.

## 2012-05-22 NOTE — Assessment & Plan Note (Signed)
Blood pressure elevated. Increase Cozaar to 100 mg daily. Check potassium and renal function in one week. 

## 2012-05-22 NOTE — Patient Instructions (Addendum)
Your physician wants you to follow-up in: ONE YEAR WITH DR Shelda Pal will receive a reminder letter in the mail two months in advance. If you don't receive a letter, please call our office to schedule the follow-up appointment.   Your physician has requested that you have an echocardiogram. Echocardiography is a painless test that uses sound waves to create images of your heart. It provides your doctor with information about the size and shape of your heart and how well your heart's chambers and valves are working. This procedure takes approximately one hour. There are no restrictions for this procedure.   INCREASE LOSARTAN TO 100 MG ONCE DAILY  Your physician recommends that you return for lab work in: ONE WEEK

## 2012-05-22 NOTE — Assessment & Plan Note (Signed)
Continue statin. Lipids and liver monitored by primary care. 

## 2012-05-22 NOTE — Assessment & Plan Note (Signed)
Management per electrophysiology. 

## 2012-05-22 NOTE — Assessment & Plan Note (Signed)
Status post mitral valve repair. Repeat echocardiogram. Continue SBE prophylaxis.

## 2012-05-29 ENCOUNTER — Telehealth: Payer: Self-pay | Admitting: *Deleted

## 2012-05-29 ENCOUNTER — Other Ambulatory Visit (INDEPENDENT_AMBULATORY_CARE_PROVIDER_SITE_OTHER): Payer: Medicare Other

## 2012-05-29 ENCOUNTER — Ambulatory Visit (HOSPITAL_COMMUNITY): Payer: Medicare Other | Attending: Cardiology | Admitting: Radiology

## 2012-05-29 DIAGNOSIS — I369 Nonrheumatic tricuspid valve disorder, unspecified: Secondary | ICD-10-CM | POA: Insufficient documentation

## 2012-05-29 DIAGNOSIS — E876 Hypokalemia: Secondary | ICD-10-CM

## 2012-05-29 DIAGNOSIS — I10 Essential (primary) hypertension: Secondary | ICD-10-CM | POA: Insufficient documentation

## 2012-05-29 DIAGNOSIS — I059 Rheumatic mitral valve disease, unspecified: Secondary | ICD-10-CM | POA: Insufficient documentation

## 2012-05-29 LAB — BASIC METABOLIC PANEL
CO2: 31 mEq/L (ref 19–32)
Glucose, Bld: 76 mg/dL (ref 70–99)
Potassium: 3.3 mEq/L — ABNORMAL LOW (ref 3.5–5.1)
Sodium: 143 mEq/L (ref 135–145)

## 2012-05-29 MED ORDER — POTASSIUM CHLORIDE CRYS ER 20 MEQ PO TBCR: 20.0000 meq | EXTENDED_RELEASE_TABLET | Freq: Every day | ORAL | Status: DC

## 2012-05-29 NOTE — Telephone Encounter (Signed)
Tried to call patient with lab results and Dr Ludwig Clarks recommendations.  I was unable to reach patient

## 2012-05-29 NOTE — Progress Notes (Signed)
Echocardiogram performed.  

## 2012-05-30 MED ORDER — POTASSIUM CHLORIDE CRYS ER 20 MEQ PO TBCR: 20.0000 meq | EXTENDED_RELEASE_TABLET | Freq: Every day | ORAL | Status: DC

## 2012-05-30 NOTE — Telephone Encounter (Signed)
Spoke with pt, aware of echo results and labs.

## 2012-06-06 ENCOUNTER — Other Ambulatory Visit (INDEPENDENT_AMBULATORY_CARE_PROVIDER_SITE_OTHER): Payer: Medicare Other

## 2012-06-06 DIAGNOSIS — E876 Hypokalemia: Secondary | ICD-10-CM

## 2012-06-06 LAB — BASIC METABOLIC PANEL
BUN: 18 mg/dL (ref 6–23)
CO2: 29 mEq/L (ref 19–32)
Chloride: 104 mEq/L (ref 96–112)
Creatinine, Ser: 0.8 mg/dL (ref 0.4–1.2)

## 2012-06-19 ENCOUNTER — Other Ambulatory Visit: Payer: Self-pay | Admitting: Internal Medicine

## 2012-06-19 ENCOUNTER — Ambulatory Visit (INDEPENDENT_AMBULATORY_CARE_PROVIDER_SITE_OTHER): Payer: Medicare Other | Admitting: *Deleted

## 2012-06-19 DIAGNOSIS — I495 Sick sinus syndrome: Secondary | ICD-10-CM

## 2012-06-19 DIAGNOSIS — Z950 Presence of cardiac pacemaker: Secondary | ICD-10-CM

## 2012-06-22 LAB — REMOTE PACEMAKER DEVICE
AL IMPEDENCE PM: 384 Ohm
ATRIAL PACING PM: 84
BAMS-0001: 175 {beats}/min
BATTERY VOLTAGE: 2.76 V
VENTRICULAR PACING PM: 100

## 2012-07-12 ENCOUNTER — Encounter: Payer: Self-pay | Admitting: *Deleted

## 2012-07-15 ENCOUNTER — Emergency Department (HOSPITAL_COMMUNITY)
Admission: EM | Admit: 2012-07-15 | Discharge: 2012-07-15 | Disposition: A | Discharge status: Home / Self Care | Payer: No Typology Code available for payment source | Attending: Emergency Medicine | Admitting: Emergency Medicine

## 2012-07-15 ENCOUNTER — Emergency Department (HOSPITAL_COMMUNITY): Payer: No Typology Code available for payment source

## 2012-07-15 ENCOUNTER — Encounter (HOSPITAL_COMMUNITY): Payer: Self-pay | Admitting: *Deleted

## 2012-07-15 DIAGNOSIS — S0590XA Unspecified injury of unspecified eye and orbit, initial encounter: Secondary | ICD-10-CM | POA: Insufficient documentation

## 2012-07-15 DIAGNOSIS — S40019A Contusion of unspecified shoulder, initial encounter: Secondary | ICD-10-CM | POA: Insufficient documentation

## 2012-07-15 DIAGNOSIS — M1990 Unspecified osteoarthritis, unspecified site: Secondary | ICD-10-CM | POA: Insufficient documentation

## 2012-07-15 DIAGNOSIS — M129 Arthropathy, unspecified: Secondary | ICD-10-CM | POA: Insufficient documentation

## 2012-07-15 DIAGNOSIS — I10 Essential (primary) hypertension: Secondary | ICD-10-CM | POA: Insufficient documentation

## 2012-07-15 DIAGNOSIS — H918X9 Other specified hearing loss, unspecified ear: Secondary | ICD-10-CM | POA: Insufficient documentation

## 2012-07-15 DIAGNOSIS — S40011A Contusion of right shoulder, initial encounter: Secondary | ICD-10-CM

## 2012-07-15 DIAGNOSIS — E785 Hyperlipidemia, unspecified: Secondary | ICD-10-CM | POA: Insufficient documentation

## 2012-07-15 DIAGNOSIS — M810 Age-related osteoporosis without current pathological fracture: Secondary | ICD-10-CM | POA: Insufficient documentation

## 2012-07-15 DIAGNOSIS — Z8679 Personal history of other diseases of the circulatory system: Secondary | ICD-10-CM | POA: Insufficient documentation

## 2012-07-15 DIAGNOSIS — H538 Other visual disturbances: Secondary | ICD-10-CM | POA: Insufficient documentation

## 2012-07-15 DIAGNOSIS — Z79899 Other long term (current) drug therapy: Secondary | ICD-10-CM | POA: Insufficient documentation

## 2012-07-15 DIAGNOSIS — Y939 Activity, unspecified: Secondary | ICD-10-CM | POA: Insufficient documentation

## 2012-07-15 DIAGNOSIS — Y92410 Unspecified street and highway as the place of occurrence of the external cause: Secondary | ICD-10-CM | POA: Insufficient documentation

## 2012-07-15 DIAGNOSIS — Z950 Presence of cardiac pacemaker: Secondary | ICD-10-CM | POA: Insufficient documentation

## 2012-07-15 DIAGNOSIS — Z7982 Long term (current) use of aspirin: Secondary | ICD-10-CM | POA: Insufficient documentation

## 2012-07-15 DIAGNOSIS — Z8739 Personal history of other diseases of the musculoskeletal system and connective tissue: Secondary | ICD-10-CM | POA: Insufficient documentation

## 2012-07-15 MED ORDER — TETRACAINE HCL 0.5 % OP SOLN
1.0000 [drp] | Freq: Once | OPHTHALMIC | Status: DC
  Filled 2012-07-15: qty 2

## 2012-07-15 MED ORDER — ACETAMINOPHEN 325 MG PO TABS
650.0000 mg | ORAL_TABLET | Freq: Once | ORAL | Status: AC
  Administered 2012-07-15: 650 mg via ORAL
  Filled 2012-07-15: qty 2

## 2012-07-15 NOTE — ED Notes (Signed)
Bedside report received from previous RN 
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
## 2012-07-15 NOTE — ED Notes (Addendum)
Pt states that she had cataract sx last year and has been having blurred vision for the last month. She has seen her opthamologist concerning this and was told to use eye drops to moisten the eye. Pt states she did not use the eye drops today.
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
## 2012-07-15 NOTE — ED Notes (Signed)
[id number]<AV> Expected date:<BR> Expected time:<BR> Means of arrival:<BR> Comments:<BR> triage

## 2012-07-15 NOTE — ED Notes (Signed)
Called nephew for transport. He stated that the pt's other nephew is enroute to pick up the pt.

## 2012-07-15 NOTE — ED Notes (Signed)
Shelda Altes [phone] - call when pt is ready for discharge

## 2012-07-19 ENCOUNTER — Encounter: Payer: Self-pay | Admitting: Internal Medicine

## 2012-07-31 ENCOUNTER — Encounter: Payer: Self-pay | Admitting: Internal Medicine

## 2012-07-31 ENCOUNTER — Ambulatory Visit (INDEPENDENT_AMBULATORY_CARE_PROVIDER_SITE_OTHER): Payer: Medicare Other | Admitting: Internal Medicine

## 2012-07-31 VITALS — BP 142/72 | HR 99 | Temp 97.6°F | Ht 64.0 in | Wt 115.0 lb

## 2012-07-31 DIAGNOSIS — G252 Other specified forms of tremor: Secondary | ICD-10-CM

## 2012-07-31 DIAGNOSIS — G250 Essential tremor: Secondary | ICD-10-CM

## 2012-07-31 DIAGNOSIS — I10 Essential (primary) hypertension: Secondary | ICD-10-CM

## 2012-07-31 DIAGNOSIS — M25511 Pain in right shoulder: Secondary | ICD-10-CM

## 2012-07-31 DIAGNOSIS — M25519 Pain in unspecified shoulder: Secondary | ICD-10-CM

## 2012-07-31 DIAGNOSIS — F411 Generalized anxiety disorder: Secondary | ICD-10-CM

## 2012-07-31 NOTE — Assessment & Plan Note (Signed)
stable overall by history and exam, recent data reviewed with pt, and pt to continue medical treatment as before,  to f/u any worsening symptoms or concerns BP Readings from Last 3 Encounters:  07/31/12 142/72  07/15/12 144/69  05/22/12 162/83

## 2012-07-31 NOTE — Assessment & Plan Note (Signed)
Declines atarax prn,  to f/u any worsening symptoms or concerns

## 2012-07-31 NOTE — Patient Instructions (Signed)
Please continue all other medications as before, and refills have been done if requested. Please call in 4 weeks if you would like referral to orthopedic for the right shoulder Please call if you change your mind about Atarax for nerves, and the Neurology referral for the tremor Please return in 6 months, or sooner if needed

## 2012-07-31 NOTE — Assessment & Plan Note (Signed)
Declines neuro referral,  to f/u any worsening symptoms or concerns

## 2012-07-31 NOTE — Assessment & Plan Note (Signed)
With some improvement, recent film neg, to cont to follow, would not be surgical candidate if rotater cuff involved, consider PT or ortho eval if not able to abduct in 3-4 wks
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
## 2012-11-21 ENCOUNTER — Encounter: Payer: Self-pay | Admitting: Internal Medicine

## 2012-11-21 ENCOUNTER — Ambulatory Visit (INDEPENDENT_AMBULATORY_CARE_PROVIDER_SITE_OTHER): Payer: Medicare Other | Admitting: Internal Medicine

## 2012-11-21 VITALS — BP 137/81 | HR 86 | Ht 63.0 in | Wt 114.8 lb

## 2012-11-21 DIAGNOSIS — I495 Sick sinus syndrome: Secondary | ICD-10-CM

## 2012-11-21 DIAGNOSIS — Z950 Presence of cardiac pacemaker: Secondary | ICD-10-CM

## 2012-11-21 DIAGNOSIS — I10 Essential (primary) hypertension: Secondary | ICD-10-CM

## 2012-11-21 LAB — PACEMAKER DEVICE OBSERVATION
AL AMPLITUDE: 0.7 mv
BAMS-0001: 175 {beats}/min
BATTERY VOLTAGE: 2.76 V
RV LEAD AMPLITUDE: 4 mv
RV LEAD IMPEDENCE PM: 383 Ohm
VENTRICULAR PACING PM: 100

## 2012-11-21 NOTE — Patient Instructions (Addendum)

## 2012-11-21 NOTE — Assessment & Plan Note (Signed)
Her medtronic DDD PM is working normally. Will recheck in several months. Approx. 3.5 years of battery longevity.

## 2012-11-21 NOTE — Assessment & Plan Note (Signed)
Her blood pressure is minimally elevated. We discussed the importance of a low sodium diet. She will continue her current medications.

## 2012-11-21 NOTE — Progress Notes (Signed)
HPI Mr. Alexis Cobb returns today for followup. She is a pleasant 79 yo woman with a h/o symptomatic bradycardia, s/p PPM insertion. She notes mild sob. No syncope, chest pain, fever or chills. Minimal peripheral edema. She notes that her energy level is mildy reduced. No obvious other symptoms to correlate. Allergies  Allergen Reactions  . Ace Inhibitors Rash  . Cephalexin Rash  . Penicillins Rash  . Risedronate Sodium Rash     Current Outpatient Prescriptions  Medication Sig Dispense Refill  . aspirin 81 MG tablet Take 81 mg by mouth daily.        . Calcium Carbonate-Vitamin D (CALCIUM + D PO) Take by mouth daily.        . Cholecalciferol (VITAMIN D) 1000 UNITS capsule Take 1,000 Units by mouth daily.        . furosemide (LASIX) 20 MG tablet Take one per day only as needed for swelling in feet/ankles.  30 tablet  11  . losartan (COZAAR) 100 MG tablet Take 1 tablet (100 mg total) by mouth daily.  90 tablet  3  . lovastatin (MEVACOR) 40 MG tablet Take 1 tablet (40 mg total) by mouth at bedtime.  30 tablet  11  . metoprolol succinate (TOPROL-XL) 50 MG 24 hr tablet Take 1.5 tablets (75 mg total) by mouth daily.  45 tablet  9  . Multiple Vitamins-Minerals (CEROVITE PO) 1 tab po qd       . nitroGLYCERIN (NITROSTAT) 0.4 MG SL tablet Place 1 tablet (0.4 mg total) under the tongue every 5 (five) minutes as needed.  25 tablet  12  . potassium chloride SA (K-DUR,KLOR-CON) 20 MEQ tablet Take 1 tablet (20 mEq total) by mouth daily.  90 tablet  3  . clindamycin (CLEOCIN) 300 MG capsule Take by mouth. Take as directed for dental procedures as needed       No current facility-administered medications for this visit.     Past Medical History  Diagnosis Date  . Hyperlipidemia   . HTN (hypertension)   . Arthritis   . ARTHRITIS, RHEUMATOID, HX OF 01/06/2007  . MITRAL REGURGITATION 01/06/2007  . OSTEOARTHRITIS 01/14/2007  . OSTEOPOROSIS 01/14/2007  . Other specified forms of hearing loss 08/01/2009  .  PACEMAKER, PERMANENT 01/06/2007  . Raynaud's syndrome 01/06/2007    ROS:   All systems reviewed and negative except as noted in the HPI.   Past Surgical History  Procedure Laterality Date  . Mitral valve repair       Family History  Problem Relation Age of Onset  . Coronary artery disease    . Hypertension       History   Social History  . Marital Status: Widowed    Spouse Name: N/A    Number of Children: N/A  . Years of Education: N/A   Occupational History  . retired Catering manager    Social History Main Topics  . Smoking status: Never Smoker   . Smokeless tobacco: Never Used  . Alcohol Use: No  . Drug Use: No  . Sexually Active: Not on file   Other Topics Concern  . Not on file   Social History Narrative  . No narrative on file     BP 137/81  Pulse 86  Ht 5\' 3"  (1.6 m)  Wt 114 lb 12.8 oz (52.073 kg)  BMI 20.34 kg/m2  Physical Exam:  Well appearing elderly woman, NAD HEENT: Unremarkable Neck:  7 cm JVD, no thyromegally Lungs:  Clear with no wheezes HEART:  Regular rate rhythm, no murmurs, no rubs, no clicks Abd:  soft, positive bowel sounds, no organomegally, no rebound, no guarding Ext:  2 plus pulses, no edema, no cyanosis, no clubbing Skin:  No rashes no nodules Neuro:  CN II through XII intact, motor grossly intact   DEVICE  Normal device function.  See PaceArt for details.   Assess/Plan:

## 2013-01-10 ENCOUNTER — Other Ambulatory Visit: Payer: Self-pay | Admitting: Internal Medicine

## 2013-02-13 ENCOUNTER — Other Ambulatory Visit: Payer: Self-pay | Admitting: *Deleted

## 2013-02-13 MED ORDER — METOPROLOL SUCCINATE ER 50 MG PO TB24: ORAL_TABLET | ORAL | Status: DC

## 2013-02-26 ENCOUNTER — Ambulatory Visit (INDEPENDENT_AMBULATORY_CARE_PROVIDER_SITE_OTHER): Payer: Medicare Other | Admitting: *Deleted

## 2013-02-26 DIAGNOSIS — I495 Sick sinus syndrome: Secondary | ICD-10-CM

## 2013-02-26 DIAGNOSIS — Z950 Presence of cardiac pacemaker: Secondary | ICD-10-CM

## 2013-03-07 LAB — REMOTE PACEMAKER DEVICE
ATRIAL PACING PM: 86
BAMS-0001: 175 {beats}/min
RV LEAD IMPEDENCE PM: 392 Ohm
VENTRICULAR PACING PM: 100

## 2013-03-16 ENCOUNTER — Encounter: Payer: Self-pay | Admitting: *Deleted

## 2013-03-26 ENCOUNTER — Encounter: Payer: Self-pay | Admitting: Internal Medicine

## 2013-05-29 ENCOUNTER — Ambulatory Visit (INDEPENDENT_AMBULATORY_CARE_PROVIDER_SITE_OTHER): Payer: Medicare Other | Admitting: Cardiology

## 2013-05-29 ENCOUNTER — Encounter: Payer: Self-pay | Admitting: Cardiology

## 2013-05-29 VITALS — BP 140/80 | HR 84 | Ht 63.0 in | Wt 112.8 lb

## 2013-05-29 DIAGNOSIS — I080 Rheumatic disorders of both mitral and aortic valves: Secondary | ICD-10-CM

## 2013-05-29 DIAGNOSIS — Z950 Presence of cardiac pacemaker: Secondary | ICD-10-CM

## 2013-05-29 DIAGNOSIS — E785 Hyperlipidemia, unspecified: Secondary | ICD-10-CM

## 2013-05-29 DIAGNOSIS — I10 Essential (primary) hypertension: Secondary | ICD-10-CM

## 2013-05-29 NOTE — Assessment & Plan Note (Signed)
Continue statin. Lipids and liver monitored by primary care. 

## 2013-05-29 NOTE — Patient Instructions (Signed)
Your physician wants you to follow-up in: ONE YEAR WITH DR CRENSHAW You will receive a reminder letter in the mail two months in advance. If you don't receive a letter, please call our office to schedule the follow-up appointment.  

## 2013-05-29 NOTE — Assessment & Plan Note (Signed)
That is post mitral valve repair. Continue SBE prophylaxis.

## 2013-05-29 NOTE — Progress Notes (Signed)
HPI: Alexis Cobb is a very pleasant female who has a history of mitral valve repair secondary to mitral regurgitation. She has also had a previous pacemaker placed and history of nonobstructive coronary disease by catheterization in 2003. She also had a Myoview performed on January 16, 2008. Her ejection fraction of 78% and there was no ischemia or infarction. Last echocardiogram in January 2014 showed normal LV function, previous mitral valve repair with trivial mitral regurgitation, mild right ventricular enlargement. I last saw her in Jan 2014. Since then she has dyspnea with more moderate activities but not routine activities. No orthopnea, PND, pedal edema, chest pain or syncope. Occasional sensation of "pounding in her chest". This occurs at night and her pulse is 60 by her report.   Current Outpatient Prescriptions  Medication Sig Dispense Refill  . aspirin 81 MG tablet Take 81 mg by mouth daily.        . Calcium Carbonate-Vitamin D (CALCIUM + D PO) Take by mouth daily.        . Cholecalciferol (VITAMIN D) 1000 UNITS capsule Take 1,000 Units by mouth daily.        . furosemide (LASIX) 20 MG tablet Take one per day only as needed for swelling in feet/ankles.  30 tablet  11  . lovastatin (MEVACOR) 40 MG tablet TAKE 1 TABLET EACH DAY.  30 tablet  10  . metoprolol succinate (TOPROL-XL) 50 MG 24 hr tablet Take 1.5 tablets (75 mg total) by mouth daily.  45 tablet  3  . Multiple Vitamins-Minerals (CEROVITE PO) 1 tab po qd       . nitroGLYCERIN (NITROSTAT) 0.4 MG SL tablet Place 1 tablet (0.4 mg total) under the tongue every 5 (five) minutes as needed.  25 tablet  12  . losartan (COZAAR) 100 MG tablet Take 1 tablet (100 mg total) by mouth daily.  90 tablet  3   No current facility-administered medications for this visit.     Past Medical History  Diagnosis Date  . Hyperlipidemia   . HTN (hypertension)   . Arthritis   . ARTHRITIS, RHEUMATOID, HX OF 01/06/2007  . MITRAL  REGURGITATION 01/06/2007  . OSTEOARTHRITIS 01/14/2007  . OSTEOPOROSIS 01/14/2007  . Other specified forms of hearing loss 08/01/2009  . PACEMAKER, PERMANENT 01/06/2007  . Raynaud's syndrome 01/06/2007    Past Surgical History  Procedure Laterality Date  . Mitral valve repair      History   Social History  . Marital Status: Widowed    Spouse Name: N/A    Number of Children: N/A  . Years of Education: N/A   Occupational History  . retired Catering manager    Social History Main Topics  . Smoking status: Never Smoker   . Smokeless tobacco: Never Used  . Alcohol Use: No  . Drug Use: No  . Sexual Activity: Not on file   Other Topics Concern  . Not on file   Social History Narrative  . No narrative on file    ROS: no fevers or chills, productive cough, hemoptysis, dysphasia, odynophagia, melena, hematochezia, dysuria, hematuria, rash, seizure activity, orthopnea, PND, pedal edema, claudication. Remaining systems are negative.  Physical Exam: Well-developed well-nourished in no acute distress.  Skin is warm and dry.  HEENT is normal.  Neck is supple.  Chest is clear to auscultation with normal expansion.  Cardiovascular exam is regular rate and rhythm. 1/6 systolic murmur apex Abdominal exam nontender or distended. No masses palpated. Extremities show trace edema.  neuro grossly intact  ECG AV paced.

## 2013-05-29 NOTE — Assessment & Plan Note (Signed)
Followed by electrophysiology. 

## 2013-05-29 NOTE — Assessment & Plan Note (Signed)
Blood pressure controlled. Continue present medications. Potassium and renal function monitored by primary care. 

## 2013-06-01 ENCOUNTER — Encounter: Payer: Medicare Other | Admitting: *Deleted

## 2013-06-05 ENCOUNTER — Encounter: Payer: Self-pay | Admitting: Internal Medicine

## 2013-06-05 ENCOUNTER — Ambulatory Visit: Payer: Medicare Other | Admitting: *Deleted

## 2013-06-05 ENCOUNTER — Telehealth: Payer: Self-pay | Admitting: Internal Medicine

## 2013-06-05 ENCOUNTER — Encounter: Payer: Self-pay | Admitting: *Deleted

## 2013-06-05 NOTE — Telephone Encounter (Signed)
Spoke with patient and explained the procedure for remote transmission.  Patient to send later today.

## 2013-06-05 NOTE — Telephone Encounter (Signed)
Pt left message on my voicemail yesterday regarding a question on her tranmission

## 2013-06-06 LAB — MDC_IDC_ENUM_SESS_TYPE_REMOTE
Battery Voltage: 2.76 V
Brady Statistic AP VS Percent: 0 %
Brady Statistic AS VP Percent: 13 %
Brady Statistic AS VS Percent: 0 %
Date Time Interrogation Session: 20150127211801
Lead Channel Impedance Value: 380 Ohm
Lead Channel Pacing Threshold Amplitude: 0.5 V
Lead Channel Pacing Threshold Amplitude: 1.25 V
Lead Channel Pacing Threshold Pulse Width: 0.4 ms
Lead Channel Pacing Threshold Pulse Width: 0.4 ms
Lead Channel Setting Pacing Amplitude: 2 V
MDC IDC MSMT BATTERY IMPEDANCE: 1344 Ohm
MDC IDC MSMT BATTERY REMAINING LONGEVITY: 37 mo
MDC IDC MSMT LEADCHNL RV IMPEDANCE VALUE: 386 Ohm
MDC IDC SET LEADCHNL RV PACING AMPLITUDE: 2.5 V
MDC IDC SET LEADCHNL RV PACING PULSEWIDTH: 0.4 ms
MDC IDC SET LEADCHNL RV SENSING SENSITIVITY: 2 mV
MDC IDC STAT BRADY AP VP PERCENT: 87 %

## 2013-06-18 ENCOUNTER — Other Ambulatory Visit: Payer: Self-pay | Admitting: *Deleted

## 2013-06-18 DIAGNOSIS — I10 Essential (primary) hypertension: Secondary | ICD-10-CM

## 2013-06-18 MED ORDER — LOSARTAN POTASSIUM 100 MG PO TABS: 100.0000 mg | ORAL_TABLET | Freq: Every day | ORAL | Status: DC

## 2013-06-18 MED ORDER — METOPROLOL SUCCINATE ER 50 MG PO TB24
ORAL_TABLET | ORAL | Status: DC
Start: 2013-06-18 — End: 2014-06-19

## 2013-07-03 ENCOUNTER — Encounter: Payer: Self-pay | Admitting: *Deleted

## 2013-09-06 ENCOUNTER — Ambulatory Visit (INDEPENDENT_AMBULATORY_CARE_PROVIDER_SITE_OTHER): Payer: Medicare Other | Admitting: *Deleted

## 2013-09-06 ENCOUNTER — Encounter: Payer: Self-pay | Admitting: Internal Medicine

## 2013-09-06 DIAGNOSIS — I498 Other specified cardiac arrhythmias: Secondary | ICD-10-CM

## 2013-09-06 LAB — MDC_IDC_ENUM_SESS_TYPE_REMOTE
Battery Impedance: 1519 Ohm
Battery Remaining Longevity: 31 mo
Brady Statistic AP VP Percent: 85 %
Brady Statistic AS VP Percent: 15 %
Lead Channel Impedance Value: 379 Ohm
Lead Channel Pacing Threshold Pulse Width: 0.4 ms
Lead Channel Setting Pacing Amplitude: 2.75 V
Lead Channel Setting Pacing Pulse Width: 0.4 ms
Lead Channel Setting Sensing Sensitivity: 2 mV
MDC IDC MSMT BATTERY VOLTAGE: 2.76 V
MDC IDC MSMT LEADCHNL RA PACING THRESHOLD AMPLITUDE: 0.375 V
MDC IDC MSMT LEADCHNL RV IMPEDANCE VALUE: 401 Ohm
MDC IDC MSMT LEADCHNL RV PACING THRESHOLD AMPLITUDE: 1.375 V
MDC IDC MSMT LEADCHNL RV PACING THRESHOLD PULSEWIDTH: 0.4 ms
MDC IDC SESS DTM: 20150430144400
MDC IDC SET LEADCHNL RA PACING AMPLITUDE: 2 V
MDC IDC STAT BRADY AP VS PERCENT: 0 %
MDC IDC STAT BRADY AS VS PERCENT: 0 %

## 2013-09-28 NOTE — Progress Notes (Signed)
Remote pacemaker transmission.   

## 2013-10-05 ENCOUNTER — Encounter: Payer: Self-pay | Admitting: Cardiology

## 2013-12-12 ENCOUNTER — Encounter: Payer: Self-pay | Admitting: Internal Medicine

## 2013-12-12 ENCOUNTER — Other Ambulatory Visit: Payer: Self-pay | Admitting: Internal Medicine

## 2013-12-12 ENCOUNTER — Other Ambulatory Visit (INDEPENDENT_AMBULATORY_CARE_PROVIDER_SITE_OTHER): Payer: Medicare Other

## 2013-12-12 ENCOUNTER — Ambulatory Visit (INDEPENDENT_AMBULATORY_CARE_PROVIDER_SITE_OTHER): Payer: Medicare Other | Admitting: Internal Medicine

## 2013-12-12 VITALS — BP 147/81 | HR 87 | Temp 98.2°F | Wt 109.5 lb

## 2013-12-12 DIAGNOSIS — E785 Hyperlipidemia, unspecified: Secondary | ICD-10-CM

## 2013-12-12 DIAGNOSIS — G609 Hereditary and idiopathic neuropathy, unspecified: Secondary | ICD-10-CM

## 2013-12-12 DIAGNOSIS — G629 Polyneuropathy, unspecified: Secondary | ICD-10-CM

## 2013-12-12 DIAGNOSIS — Z0000 Encounter for general adult medical examination without abnormal findings: Secondary | ICD-10-CM

## 2013-12-12 LAB — URINALYSIS, ROUTINE W REFLEX MICROSCOPIC
BILIRUBIN URINE: NEGATIVE
HGB URINE DIPSTICK: NEGATIVE
Ketones, ur: NEGATIVE
LEUKOCYTES UA: NEGATIVE
NITRITE: NEGATIVE
PH: 7.5 (ref 5.0–8.0)
Specific Gravity, Urine: 1.01 (ref 1.000–1.030)
TOTAL PROTEIN, URINE-UPE24: NEGATIVE
Urine Glucose: NEGATIVE
Urobilinogen, UA: 0.2 (ref 0.0–1.0)

## 2013-12-12 LAB — CBC WITH DIFFERENTIAL/PLATELET
BASOS PCT: 0.7 % (ref 0.0–3.0)
Basophils Absolute: 0.1 10*3/uL (ref 0.0–0.1)
EOS ABS: 0.2 10*3/uL (ref 0.0–0.7)
EOS PCT: 2.1 % (ref 0.0–5.0)
HEMATOCRIT: 41.4 % (ref 36.0–46.0)
HEMOGLOBIN: 13.8 g/dL (ref 12.0–15.0)
LYMPHS ABS: 1.8 10*3/uL (ref 0.7–4.0)
Lymphocytes Relative: 22.7 % (ref 12.0–46.0)
MCHC: 33.3 g/dL (ref 30.0–36.0)
MCV: 98.6 fl (ref 78.0–100.0)
MONO ABS: 0.7 10*3/uL (ref 0.1–1.0)
Monocytes Relative: 8.6 % (ref 3.0–12.0)
NEUTROS ABS: 5.3 10*3/uL (ref 1.4–7.7)
Neutrophils Relative %: 65.9 % (ref 43.0–77.0)
Platelets: 252 10*3/uL (ref 150.0–400.0)
RBC: 4.2 Mil/uL (ref 3.87–5.11)
RDW: 14.6 % (ref 11.5–15.5)
WBC: 8.1 10*3/uL (ref 4.0–10.5)

## 2013-12-12 LAB — BASIC METABOLIC PANEL
BUN: 20 mg/dL (ref 6–23)
CO2: 31 mEq/L (ref 19–32)
CREATININE: 0.8 mg/dL (ref 0.4–1.2)
Calcium: 10.4 mg/dL (ref 8.4–10.5)
Chloride: 103 mEq/L (ref 96–112)
GFR: 69.16 mL/min (ref >60.00)
GLUCOSE: 82 mg/dL (ref 70–99)
Potassium: 3.8 mEq/L (ref 3.5–5.1)
Sodium: 141 mEq/L (ref 135–145)

## 2013-12-12 LAB — LIPID PANEL
CHOLESTEROL: 137 mg/dL (ref 0–200)
HDL: 47.8 mg/dL (ref >39.00)
LDL CALC: 62 mg/dL (ref 0–99)
NonHDL: 89.2
TRIGLYCERIDES: 138 mg/dL (ref 0.0–149.0)
Total CHOL/HDL Ratio: 3
VLDL: 27.6 mg/dL (ref 0.0–40.0)

## 2013-12-12 LAB — VITAMIN B12: VITAMIN B 12: 529 pg/mL (ref 211–911)

## 2013-12-12 LAB — HEPATIC FUNCTION PANEL
ALBUMIN: 4 g/dL (ref 3.5–5.2)
ALT: 13 U/L (ref 0–35)
AST: 28 U/L (ref 0–37)
Alkaline Phosphatase: 70 U/L (ref 39–117)
BILIRUBIN TOTAL: 0.7 mg/dL (ref 0.2–1.2)
Bilirubin, Direct: 0.1 mg/dL (ref 0.0–0.3)
Total Protein: 6.9 g/dL (ref 6.0–8.3)

## 2013-12-12 LAB — TSH: TSH: 1.8 u[IU]/mL (ref 0.35–4.50)

## 2013-12-12 MED ORDER — AMITRIPTYLINE HCL 25 MG PO TABS: 25.0000 mg | ORAL_TABLET | Freq: Every evening | ORAL | Status: DC | PRN

## 2013-12-12 NOTE — Progress Notes (Signed)
Subjective:    Patient ID: Alexis Cobb, female    DOB: 1924/10/30, 80 y.o.   MRN: [id number]  HPI  Here for wellness and f/u;  Overall doing ok;  Pt denies CP, worsening SOB, DOE, wheezing, orthopnea, PND, worsening LE edema, palpitations, dizziness or syncope.  Pt denies neurological change such as new headache, facial or extremity weakness.  Pt denies polydipsia, polyuria, or low sugar symptoms. Pt states overall good compliance with treatment and medications, good tolerability, and has been trying to follow lower cholesterol diet.  Pt denies worsening depressive symptoms, suicidal ideation or panic. No fever, night sweats, wt loss, loss of appetite, or other constitutional symptoms.  Pt states good ability with ADL's, has low fall risk, home safety reviewed and adequate, no other significant changes in hearing or vision, and only occasionally active with exercise.  Also C/o bilat feet issue x 2 mo - States normal feet during the day (sensation and movement) but upon lying down has funny sensation/numbness to feet and toes, aas well as first thing in the am, but after get up in AM. Has ongoing recurrent neck pain, has seen 2 different NS 30 yrs ago who did not recommend surgury, both rec'd tylenol and wearing collar when pain flared. Tends to get the pain only after moving or lifting heavier objects.  Did have a stumble misttep with near fall approx 3 wks ago, with neck pain flared from twisting the back she thinks.   Past Medical History  Diagnosis Date  . Hyperlipidemia   . HTN (hypertension)   . Arthritis   . ARTHRITIS, RHEUMATOID, HX OF 01/06/2007  . MITRAL REGURGITATION 01/06/2007  . OSTEOARTHRITIS 01/14/2007  . OSTEOPOROSIS 01/14/2007  . Other specified forms of hearing loss 08/01/2009  . PACEMAKER, PERMANENT 01/06/2007  . Raynaud's syndrome 01/06/2007   Past Surgical History  Procedure Laterality Date  . Mitral valve repair      reports that she has never smoked. She has never used  smokeless tobacco. She reports that she does not drink alcohol or use illicit drugs. family history includes Coronary artery disease in an other family member; Hypertension in an other family member. Allergies  Allergen Reactions  . Ace Inhibitors Rash  . Cephalexin Rash  . Penicillins Rash  . Risedronate Sodium Rash   Current Outpatient Prescriptions on File Prior to Visit  Medication Sig Dispense Refill  . aspirin 81 MG tablet Take 81 mg by mouth daily.        . Calcium Carbonate-Vitamin D (CALCIUM + D PO) Take by mouth daily.        . Cholecalciferol (VITAMIN D) 1000 UNITS capsule Take 1,000 Units by mouth daily.        . furosemide (LASIX) 20 MG tablet Take one per day only as needed for swelling in feet/ankles.  30 tablet  11  . losartan (COZAAR) 100 MG tablet Take 1 tablet (100 mg total) by mouth daily.  90 tablet  3  . metoprolol succinate (TOPROL-XL) 50 MG 24 hr tablet Take 1.5 tablets (75 mg total) by mouth daily.  45 tablet  11  . Multiple Vitamins-Minerals (CEROVITE PO) 1 tab po qd       . nitroGLYCERIN (NITROSTAT) 0.4 MG SL tablet Place 1 tablet (0.4 mg total) under the tongue every 5 (five) minutes as needed.  25 tablet  12   No current facility-administered medications on file prior to visit.   Review of Systems Constitutional: Negative for increased diaphoresis, other  activity, appetite or other siginficant weight change  HENT: Negative for worsening hearing loss, ear pain, facial swelling, mouth sores and neck stiffness.   Eyes: Negative for other worsening pain, redness or visual disturbance.  Respiratory: Negative for shortness of breath and wheezing.   Cardiovascular: Negative for chest pain and palpitations.  Gastrointestinal: Negative for diarrhea, blood in stool, abdominal distention or other pain Genitourinary: Negative for hematuria, flank pain or change in urine volume.  Musculoskeletal: Negative for myalgias or other joint complaints.  Skin: Negative for color  change and wound.  Neurological: Negative for syncope and numbness. other than noted Hematological: Negative for adenopathy. or other swelling Psychiatric/Behavioral: Negative for hallucinations, self-injury, decreased concentration or other worsening agitation.      Objective:   Physical Exam BP 147/81  Pulse 87  Temp(Src) 98.2 F (36.8 C) (Oral)  Wt 109 lb 8 oz (49.669 kg)  SpO2 96% VS noted,  Constitutional: Pt is oriented to person, place, and time. Appears well-developed and well-nourished.  Head: Normocephalic and atraumatic.  Right Ear: External ear normal.  Left Ear: External ear normal.  Nose: Nose normal.  Mouth/Throat: Oropharynx is clear and moist.  Eyes: Conjunctivae and EOM are normal. Pupils are equal, round, and reactive to light.  Neck: Normal range of motion. Neck supple. No JVD present. No tracheal deviation present.  Cardiovascular: Normal rate, regular rhythm, normal heart sounds and intact distal pulses.   Pulmonary/Chest: Effort normal and breath sounds without rales or wheezing  Abdominal: Soft. Bowel sounds are normal. NT. No HSM  Musculoskeletal: Normal range of motion. Exhibits no edema.  Lymphadenopathy:  Has no cervical adenopathy.  Neurological: Pt is alert and oriented to person, place, and time. Pt has normal reflexes. No cranial nerve deficit. Motor grossly intact, with decr sens to LT toes bilat Skin: Skin is warm and dry. No rash noted.  Psychiatric:  Has normal mood and affect. Behavior is normal.     Assessment & Plan:

## 2013-12-12 NOTE — Patient Instructions (Signed)
Please take all new medication as prescribed  - the elavil at 25 mg per day  Plesae call if you think increased elavil to 50 mg at bedtime might help  Please continue all other medications as before, and refills have been done if requested.  Please have the pharmacy call with any other refills you may need.  Please continue your efforts at being more active, low cholesterol diet, and weight control.  You are otherwise up to date with prevention measures today.  Please keep your appointments with your specialists as you may have planned  Please go to the LAB in the Basement (turn left off the elevator) for the tests to be done today  You will be contacted by phone if any changes need to be made immediately.  Otherwise, you will receive a letter about your results with an explanation, but please check with MyChart first.  Please remember to sign up for MyChart if you have not done so, as this will be important to you in the future with finding out test results, communicating by private email, and scheduling acute appointments online when needed.  Please return in 6 months, or sooner if needed

## 2013-12-12 NOTE — Progress Notes (Signed)
Pre visit review using our clinic review tool, if applicable. No additional management support is needed unless otherwise documented below in the visit note. 

## 2013-12-16 NOTE — Assessment & Plan Note (Signed)
For elavil 25 qhs prn, consider 50 mg if not effective, warned of constipation, dizziness, confusion, for B12 check

## 2013-12-16 NOTE — Assessment & Plan Note (Signed)

## 2013-12-24 ENCOUNTER — Ambulatory Visit (INDEPENDENT_AMBULATORY_CARE_PROVIDER_SITE_OTHER): Payer: Medicare Other | Admitting: Internal Medicine

## 2013-12-24 ENCOUNTER — Encounter: Payer: Self-pay | Admitting: Internal Medicine

## 2013-12-24 VITALS — BP 163/88 | HR 90 | Ht 62.0 in | Wt 109.0 lb

## 2013-12-24 DIAGNOSIS — I495 Sick sinus syndrome: Secondary | ICD-10-CM

## 2013-12-24 DIAGNOSIS — Z950 Presence of cardiac pacemaker: Secondary | ICD-10-CM

## 2013-12-24 DIAGNOSIS — I10 Essential (primary) hypertension: Secondary | ICD-10-CM

## 2013-12-24 LAB — MDC_IDC_ENUM_SESS_TYPE_INCLINIC
Battery Impedance: 1672 Ohm
Battery Voltage: 2.74 V
Brady Statistic AP VP Percent: 85 %
Brady Statistic AP VS Percent: 0 %
Brady Statistic AS VP Percent: 15 %
Date Time Interrogation Session: 20150817140446
Lead Channel Impedance Value: 380 Ohm
Lead Channel Pacing Threshold Amplitude: 0.5 V
Lead Channel Pacing Threshold Pulse Width: 0.4 ms
Lead Channel Setting Pacing Amplitude: 2.5 V
MDC IDC MSMT BATTERY REMAINING LONGEVITY: 30 mo
MDC IDC MSMT LEADCHNL RA SENSING INTR AMPL: 1 mV
MDC IDC MSMT LEADCHNL RV IMPEDANCE VALUE: 401 Ohm
MDC IDC MSMT LEADCHNL RV PACING THRESHOLD AMPLITUDE: 1.25 V
MDC IDC MSMT LEADCHNL RV PACING THRESHOLD PULSEWIDTH: 0.46 ms
MDC IDC MSMT LEADCHNL RV SENSING INTR AMPL: 4 mV
MDC IDC SET LEADCHNL RA PACING AMPLITUDE: 2 V
MDC IDC SET LEADCHNL RV PACING PULSEWIDTH: 0.46 ms
MDC IDC SET LEADCHNL RV SENSING SENSITIVITY: 2 mV
MDC IDC STAT BRADY AS VS PERCENT: 0 %

## 2013-12-24 NOTE — Assessment & Plan Note (Signed)
Her blood pressure is elevated today. At home she states it is well controlled. I've asked the patient to reduce her sodium intake. She will continue her current medical therapy.

## 2013-12-24 NOTE — Assessment & Plan Note (Signed)
Her Medtronic dual-chamber pacemaker is working normally. We'll plan to recheck in several months. 

## 2013-12-24 NOTE — Progress Notes (Signed)
HPI Mr. Fifield returns today for followup. She is a pleasant 80 yo woman with a h/o symptomatic bradycardia secondary to complete heart block, s/p PPM insertion. She notes mild sob. No syncope, chest pain, fever or chills. Minimal peripheral edema. She notes that her energy level is good. No obvious other symptoms to correlate. Allergies  Allergen Reactions  . Ace Inhibitors Rash  . Cephalexin Rash  . Penicillins Rash  . Risedronate Sodium Rash     Current Outpatient Prescriptions  Medication Sig Dispense Refill  . amitriptyline (ELAVIL) 25 MG tablet Take 1 tablet (25 mg total) by mouth at bedtime as needed for sleep.  90 tablet  1  . aspirin 81 MG tablet Take 81 mg by mouth daily.        . Calcium Carbonate-Vitamin D (CALCIUM + D PO) Take by mouth daily.        . Cholecalciferol (VITAMIN D) 1000 UNITS capsule Take 1,000 Units by mouth daily.        . furosemide (LASIX) 20 MG tablet Take one per day only as needed for swelling in feet/ankles.  30 tablet  11  . losartan (COZAAR) 100 MG tablet Take 1 tablet (100 mg total) by mouth daily.  90 tablet  3  . lovastatin (MEVACOR) 40 MG tablet TAKE 1 TABLET EACH DAY.  30 tablet  11  . metoprolol succinate (TOPROL-XL) 50 MG 24 hr tablet Take 1.5 tablets (75 mg total) by mouth daily.  45 tablet  11  . Multiple Vitamins-Minerals (CEROVITE PO) 1 tab po qd       . nitroGLYCERIN (NITROSTAT) 0.4 MG SL tablet Place 1 tablet (0.4 mg total) under the tongue every 5 (five) minutes as needed.  25 tablet  12   No current facility-administered medications for this visit.     Past Medical History  Diagnosis Date  . Hyperlipidemia   . HTN (hypertension)   . Arthritis   . ARTHRITIS, RHEUMATOID, HX OF 01/06/2007  . MITRAL REGURGITATION 01/06/2007  . OSTEOARTHRITIS 01/14/2007  . OSTEOPOROSIS 01/14/2007  . Other specified forms of hearing loss 08/01/2009  . PACEMAKER, PERMANENT 01/06/2007  . Raynaud's syndrome 01/06/2007    ROS:   All systems reviewed  and negative except as noted in the HPI.   Past Surgical History  Procedure Laterality Date  . Mitral valve repair       Family History  Problem Relation Age of Onset  . Coronary artery disease    . Hypertension       History   Social History  . Marital Status: Widowed    Spouse Name: N/A    Number of Children: N/A  . Years of Education: N/A   Occupational History  . retired Catering manager    Social History Main Topics  . Smoking status: Never Smoker   . Smokeless tobacco: Never Used  . Alcohol Use: No  . Drug Use: No  . Sexual Activity: Not on file   Other Topics Concern  . Not on file   Social History Narrative  . No narrative on file     BP 163/88  Pulse 90  Ht 5\' 2"  (1.575 m)  Wt 109 lb (49.442 kg)  BMI 19.93 kg/m2  Physical Exam:  Frail appearing elderly woman, NAD HEENT: Unremarkable Neck:  7 cm JVD, no thyromegally Lungs:  Clear with no wheezes HEART:  Regular rate rhythm, no murmurs, no rubs, no clicks Abd:  soft, positive bowel sounds, no organomegally, no rebound, no guarding  Ext:  2 plus pulses, no edema, no cyanosis, no clubbing Skin:  No rashes no nodules Neuro:  CN II through XII intact, motor grossly intact   DEVICE  Normal device function.  See PaceArt for details.   Assess/Plan:

## 2013-12-24 NOTE — Patient Instructions (Signed)
Remote device check on 03/25/14.  Your physician recommends that you continue on your current medications as directed. Please refer to the Current Medication list given to you today.  Your physician wants you to follow-up in: 1 YEAR with Dr Ladona Ridgel.  You will receive a reminder letter in the mail two months in advance. If you don't receive a letter, please call our office to schedule the follow-up appointment.

## 2014-01-01 ENCOUNTER — Encounter: Payer: Self-pay | Admitting: Internal Medicine

## 2014-03-25 ENCOUNTER — Ambulatory Visit (INDEPENDENT_AMBULATORY_CARE_PROVIDER_SITE_OTHER): Payer: Medicare Other | Admitting: *Deleted

## 2014-03-25 ENCOUNTER — Telehealth: Payer: Self-pay | Admitting: Cardiology

## 2014-03-25 DIAGNOSIS — I495 Sick sinus syndrome: Secondary | ICD-10-CM

## 2014-03-25 NOTE — Telephone Encounter (Signed)
Spoke with pt and reminded pt of remote transmission that is due today. Pt verbalized understanding.   

## 2014-03-26 ENCOUNTER — Telehealth: Payer: Self-pay | Admitting: Internal Medicine

## 2014-03-26 DIAGNOSIS — I495 Sick sinus syndrome: Secondary | ICD-10-CM

## 2014-03-26 NOTE — Progress Notes (Signed)
Remote pacemaker transmission.   

## 2014-03-26 NOTE — Telephone Encounter (Signed)
ERROR

## 2014-03-27 LAB — MDC_IDC_ENUM_SESS_TYPE_REMOTE
Battery Impedance: 1834 Ohm
Battery Remaining Longevity: 28 mo
Brady Statistic AP VP Percent: 81 %
Lead Channel Setting Pacing Amplitude: 2.5 V
Lead Channel Setting Pacing Pulse Width: 0.4 ms
Lead Channel Setting Sensing Sensitivity: 2 mV
MDC IDC MSMT BATTERY VOLTAGE: 2.74 V
MDC IDC MSMT LEADCHNL RA IMPEDANCE VALUE: 380 Ohm
MDC IDC MSMT LEADCHNL RA PACING THRESHOLD AMPLITUDE: 0.375 V
MDC IDC MSMT LEADCHNL RA PACING THRESHOLD PULSEWIDTH: 0.4 ms
MDC IDC MSMT LEADCHNL RV IMPEDANCE VALUE: 411 Ohm
MDC IDC MSMT LEADCHNL RV PACING THRESHOLD AMPLITUDE: 1 V
MDC IDC MSMT LEADCHNL RV PACING THRESHOLD PULSEWIDTH: 0.4 ms
MDC IDC SESS DTM: 20151117152550
MDC IDC SET LEADCHNL RA PACING AMPLITUDE: 2 V
MDC IDC STAT BRADY AP VS PERCENT: 0 %
MDC IDC STAT BRADY AS VP PERCENT: 19 %
MDC IDC STAT BRADY AS VS PERCENT: 0 %

## 2014-04-03 ENCOUNTER — Encounter: Payer: Self-pay | Admitting: Cardiology

## 2014-04-22 ENCOUNTER — Encounter: Payer: Self-pay | Admitting: Internal Medicine

## 2014-05-29 NOTE — Progress Notes (Signed)
      HPI: Alexis Cobb is a very pleasant female who has a history of mitral valve repair secondary to mitral regurgitation. She has also had a previous pacemaker placed and history of nonobstructive coronary disease by catheterization in 2003. She also had a Myoview performed on January 16, 2008. Her ejection fraction of 78% and there was no ischemia or infarction. Last echocardiogram in January 2014 showed normal LV function, previous mitral valve repair with trivial mitral regurgitation, mild right ventricular enlargement. Since I last saw her,   Current Outpatient Prescriptions  Medication Sig Dispense Refill  . amitriptyline (ELAVIL) 25 MG tablet Take 1 tablet (25 mg total) by mouth at bedtime as needed for sleep. 90 tablet 1  . aspirin 81 MG tablet Take 81 mg by mouth daily.      . Calcium Carbonate-Vitamin D (CALCIUM + D PO) Take by mouth daily.      . Cholecalciferol (VITAMIN D) 1000 UNITS capsule Take 1,000 Units by mouth daily.      . furosemide (LASIX) 20 MG tablet Take one per day only as needed for swelling in feet/ankles. 30 tablet 11  . losartan (COZAAR) 100 MG tablet Take 1 tablet (100 mg total) by mouth daily. 90 tablet 3  . lovastatin (MEVACOR) 40 MG tablet TAKE 1 TABLET EACH DAY. 30 tablet 11  . metoprolol succinate (TOPROL-XL) 50 MG 24 hr tablet Take 1.5 tablets (75 mg total) by mouth daily. 45 tablet 11  . Multiple Vitamins-Minerals (CEROVITE PO) 1 tab po qd     . nitroGLYCERIN (NITROSTAT) 0.4 MG SL tablet Place 1 tablet (0.4 mg total) under the tongue every 5 (five) minutes as needed. 25 tablet 12   No current facility-administered medications for this visit.     Past Medical History  Diagnosis Date  . Hyperlipidemia   . HTN (hypertension)   . Arthritis   . ARTHRITIS, RHEUMATOID, HX OF 01/06/2007  . MITRAL REGURGITATION 01/06/2007  . OSTEOARTHRITIS 01/14/2007  . OSTEOPOROSIS 01/14/2007  . Other specified forms of hearing loss 08/01/2009  . PACEMAKER, PERMANENT  01/06/2007  . Raynaud's syndrome 01/06/2007    Past Surgical History  Procedure Laterality Date  . Mitral valve repair      History   Social History  . Marital Status: Widowed    Spouse Name: N/A    Number of Children: N/A  . Years of Education: N/A   Occupational History  . retired Catering manager    Social History Main Topics  . Smoking status: Never Smoker   . Smokeless tobacco: Never Used  . Alcohol Use: No  . Drug Use: No  . Sexual Activity: Not on file   Other Topics Concern  . Not on file   Social History Narrative  . No narrative on file    ROS: no fevers or chills, productive cough, hemoptysis, dysphasia, odynophagia, melena, hematochezia, dysuria, hematuria, rash, seizure activity, orthopnea, PND, pedal edema, claudication. Remaining systems are negative.  Physical Exam: Well-developed well-nourished in no acute distress.  Skin is warm and dry.  HEENT is normal.  Neck is supple.  Chest is clear to auscultation with normal expansion.  Cardiovascular exam is regular rate and rhythm.  Abdominal exam nontender or distended. No masses palpated. Extremities show no edema. neuro grossly intact  ECG     This encounter was created in error - please disregard.

## 2014-06-03 ENCOUNTER — Encounter: Payer: Medicare Other | Admitting: Cardiology

## 2014-06-18 ENCOUNTER — Encounter: Payer: Self-pay | Admitting: Internal Medicine

## 2014-06-18 ENCOUNTER — Ambulatory Visit (INDEPENDENT_AMBULATORY_CARE_PROVIDER_SITE_OTHER): Payer: Medicare Other | Admitting: Internal Medicine

## 2014-06-18 VITALS — BP 160/80 | HR 92 | Temp 97.5°F | Ht 62.0 in | Wt 109.0 lb

## 2014-06-18 DIAGNOSIS — F411 Generalized anxiety disorder: Secondary | ICD-10-CM

## 2014-06-18 DIAGNOSIS — E785 Hyperlipidemia, unspecified: Secondary | ICD-10-CM

## 2014-06-18 DIAGNOSIS — Z0189 Encounter for other specified special examinations: Secondary | ICD-10-CM

## 2014-06-18 DIAGNOSIS — I10 Essential (primary) hypertension: Secondary | ICD-10-CM

## 2014-06-18 DIAGNOSIS — Z0000 Encounter for general adult medical examination without abnormal findings: Secondary | ICD-10-CM

## 2014-06-18 DIAGNOSIS — Z23 Encounter for immunization: Secondary | ICD-10-CM

## 2014-06-18 NOTE — Assessment & Plan Note (Signed)
stable overall by history and exam, recent data reviewed with pt, and pt to continue medical treatment as before,  to f/u any worsening symptoms or concerns Lab Results  Component Value Date   WBC 8.1 12/12/2013   HGB 13.8 12/12/2013   HCT 41.4 12/12/2013   PLT 252.0 12/12/2013   GLUCOSE 82 12/12/2013   CHOL 137 12/12/2013   TRIG 138.0 12/12/2013   HDL 47.80 12/12/2013   LDLCALC 62 12/12/2013   ALT 13 12/12/2013   AST 28 12/12/2013   NA 141 12/12/2013   K 3.8 12/12/2013   CL 103 12/12/2013   CREATININE 0.8 12/12/2013   BUN 20 12/12/2013   CO2 31 12/12/2013   TSH 1.80 12/12/2013   INR 1.1 ratio* 08/29/2008

## 2014-06-18 NOTE — Progress Notes (Signed)
Subjective:    Patient ID: Alexis Cobb, female    DOB: 01/06/1925, 81 y.o.   MRN: [id number]  HPI Here to f/u; overall doing ok,  Pt denies chest pain, increased sob or doe, wheezing, orthopnea, PND, increased LE swelling, palpitations, dizziness or syncope.  Pt denies polydipsia, polyuria, or low sugar symptoms such as weakness or confusion improved with po intake.  Pt denies new neurological symptoms such as new headache, or facial or extremity weakness or numbness.   Pt states overall good compliance with meds, has been trying to follow lower cholesterol, diabetic diet, with wt overall stable. Pt will call to re-schedule appt with Dr Jens Som missed due to weather recetn. Due for flu shot, asks for handicap permit BP Readings from Last 3 Encounters:  06/18/14 160/80  12/24/13 163/88  12/12/13 147/81   Past Medical History  Diagnosis Date  . Hyperlipidemia   . HTN (hypertension)   . Arthritis   . ARTHRITIS, RHEUMATOID, HX OF 01/06/2007  . MITRAL REGURGITATION 01/06/2007  . OSTEOARTHRITIS 01/14/2007  . OSTEOPOROSIS 01/14/2007  . Other specified forms of hearing loss 08/01/2009  . PACEMAKER, PERMANENT 01/06/2007  . Raynaud's syndrome 01/06/2007   Past Surgical History  Procedure Laterality Date  . Mitral valve repair      reports that she has never smoked. She has never used smokeless tobacco. She reports that she does not drink alcohol or use illicit drugs. family history includes Coronary artery disease in an other family member; Hypertension in an other family member. Allergies  Allergen Reactions  . Ace Inhibitors Rash  . Cephalexin Rash  . Penicillins Rash  . Risedronate Sodium Rash   Current Outpatient Prescriptions on File Prior to Visit  Medication Sig Dispense Refill  . amitriptyline (ELAVIL) 25 MG tablet Take 1 tablet (25 mg total) by mouth at bedtime as needed for sleep. 90 tablet 1  . aspirin 81 MG tablet Take 81 mg by mouth daily.      . Calcium Carbonate-Vitamin  D (CALCIUM + D PO) Take by mouth daily.      . Cholecalciferol (VITAMIN D) 1000 UNITS capsule Take 1,000 Units by mouth daily.      . furosemide (LASIX) 20 MG tablet Take one per day only as needed for swelling in feet/ankles. 30 tablet 11  . losartan (COZAAR) 100 MG tablet Take 1 tablet (100 mg total) by mouth daily. 90 tablet 3  . lovastatin (MEVACOR) 40 MG tablet TAKE 1 TABLET EACH DAY. 30 tablet 11  . metoprolol succinate (TOPROL-XL) 50 MG 24 hr tablet Take 1.5 tablets (75 mg total) by mouth daily. 45 tablet 11  . Multiple Vitamins-Minerals (CEROVITE PO) 1 tab po qd     . nitroGLYCERIN (NITROSTAT) 0.4 MG SL tablet Place 1 tablet (0.4 mg total) under the tongue every 5 (five) minutes as needed. 25 tablet 12   No current facility-administered medications on file prior to visit.   Review of Systems  Constitutional: Negative for unusual diaphoresis or other sweats  HENT: Negative for ringing in ear Eyes: Negative for double vision or worsening visual disturbance.  Respiratory: Negative for choking and stridor.   Gastrointestinal: Negative for vomiting or other signifcant bowel change Genitourinary: Negative for hematuria or decreased urine volume.  Musculoskeletal: Negative for other MSK pain or swelling Skin: Negative for color change and worsening wound.  Neurological: Negative for tremors and numbness other than noted  Psychiatric/Behavioral: Negative for decreased concentration or agitation other than above  Objective:   Physical Exam BP 160/80 mmHg  Pulse 92  Temp(Src) 97.5 F (36.4 C) (Oral)  Ht 5\' 2"  (1.575 m)  Wt 109 lb (49.442 kg)  BMI 19.93 kg/m2 VS noted,  Constitutional: Pt appears well-developed, well-nourished.but thin, frailer  HENT: Head: NCAT.  Right Ear: External ear normal.  Left Ear: External ear normal.  Eyes: . Pupils are equal, round, and reactive to light. Conjunctivae and EOM are normal Neck: Normal range of motion. Neck supple.  Cardiovascular:  Normal rate and regular rhythm.   Pulmonary/Chest: Effort normal and breath sounds without rales or wheezing.  Abd:  Soft, NT, ND, + BS Neurological: Pt is alert. Not confused , motor grossly intact, mild tremor noted to head, voice Mild upper thoracic kyphosis Skin: Skin is warm. No rash Psychiatric: Pt behavior is normal. No agitation. not depressed affect, no signficant anxious    Assessment & Plan:

## 2014-06-18 NOTE — Patient Instructions (Signed)
You had the flu shot today  You are given the handicap permit application signed  Please continue all other medications as before, and refills have been done if requested.  Please have the pharmacy call with any other refills you may need.  Please continue your efforts at being more active, low cholesterol diet, and weight control.  Please keep your appointments with your specialists as you may have planned - such as Dr Jens Som   Please return in 6 months, or sooner if needed, with Lab testing done 3-5 days before

## 2014-06-18 NOTE — Progress Notes (Signed)
Pre visit review using our clinic review tool, if applicable. No additional management support is needed unless otherwise documented below in the visit note. 

## 2014-06-18 NOTE — Assessment & Plan Note (Signed)
stable overall by history and exam, recent data reviewed with pt, and pt to continue medical treatment as before,  to f/u any worsening symptoms or concerns Lab Results  Component Value Date   LDLCALC 62 12/12/2013

## 2014-06-18 NOTE — Assessment & Plan Note (Signed)
Declines BP med change such as losartan to avapro, as she recalls BP about the same with cardiology last visit, and meds not felt needded to be changed BP Readings from Last 3 Encounters:  06/18/14 160/80  12/24/13 163/88  12/12/13 147/81

## 2014-06-19 ENCOUNTER — Other Ambulatory Visit: Payer: Self-pay | Admitting: Cardiology

## 2014-06-20 ENCOUNTER — Other Ambulatory Visit: Payer: Self-pay | Admitting: Cardiology

## 2014-06-25 ENCOUNTER — Telehealth: Payer: Self-pay | Admitting: Internal Medicine

## 2014-06-25 NOTE — Telephone Encounter (Signed)
emmi mailed  °

## 2014-06-26 ENCOUNTER — Telehealth: Payer: Self-pay | Admitting: Cardiology

## 2014-06-26 ENCOUNTER — Ambulatory Visit (INDEPENDENT_AMBULATORY_CARE_PROVIDER_SITE_OTHER): Payer: Medicare Other | Admitting: *Deleted

## 2014-06-26 DIAGNOSIS — I495 Sick sinus syndrome: Secondary | ICD-10-CM

## 2014-06-26 NOTE — Telephone Encounter (Signed)
Spoke with pt and reminded pt of remote transmission that is due today. Pt verbalized understanding.   

## 2014-06-26 NOTE — Progress Notes (Signed)
Remote pacemaker transmission.   

## 2014-06-27 LAB — MDC_IDC_ENUM_SESS_TYPE_REMOTE
Battery Remaining Longevity: 26 mo
Battery Voltage: 2.74 V
Brady Statistic AS VS Percent: 0 %
Lead Channel Pacing Threshold Amplitude: 1.125 V
Lead Channel Pacing Threshold Pulse Width: 0.4 ms
Lead Channel Setting Pacing Amplitude: 2 V
Lead Channel Setting Sensing Sensitivity: 2 mV
MDC IDC MSMT BATTERY IMPEDANCE: 1946 Ohm
MDC IDC MSMT LEADCHNL RA IMPEDANCE VALUE: 372 Ohm
MDC IDC MSMT LEADCHNL RA PACING THRESHOLD AMPLITUDE: 0.5 V
MDC IDC MSMT LEADCHNL RA PACING THRESHOLD PULSEWIDTH: 0.4 ms
MDC IDC MSMT LEADCHNL RV IMPEDANCE VALUE: 406 Ohm
MDC IDC SESS DTM: 20160217182744
MDC IDC SET LEADCHNL RV PACING AMPLITUDE: 2.5 V
MDC IDC SET LEADCHNL RV PACING PULSEWIDTH: 0.4 ms
MDC IDC STAT BRADY AP VP PERCENT: 81 %
MDC IDC STAT BRADY AP VS PERCENT: 0 %
MDC IDC STAT BRADY AS VP PERCENT: 19 %

## 2014-07-05 ENCOUNTER — Encounter: Payer: Self-pay | Admitting: *Deleted

## 2014-07-16 ENCOUNTER — Encounter: Payer: Self-pay | Admitting: Internal Medicine

## 2014-08-07 NOTE — Progress Notes (Signed)
HPI: Alexis Cobb is a very pleasant female who has a history of mitral valve repair secondary to mitral regurgitation. She has also had a previous pacemaker placed and history of nonobstructive coronary disease by catheterization in 2003. She also had a Myoview performed on January 16, 2008. Her ejection fraction of 78% and there was no ischemia or infarction. Last echocardiogram in January 2014 showed normal LV function, previous mitral valve repair with trivial mitral regurgitation, mild right ventricular enlargement. Since I last saw her, the patient has dyspnea with more extreme activities but not with routine activities. It is relieved with rest. It is not associated with chest pain. There is no orthopnea, PND or pedal edema. There is no syncope or palpitations. There is no exertional chest pain.    Current Outpatient Prescriptions  Medication Sig Dispense Refill  . amitriptyline (ELAVIL) 25 MG tablet Take 1 tablet (25 mg total) by mouth at bedtime as needed for sleep. 90 tablet 1  . aspirin 81 MG tablet Take 81 mg by mouth daily.      . Calcium Carbonate-Vitamin D (CALCIUM + D PO) Take by mouth daily.      . Cholecalciferol (VITAMIN D) 1000 UNITS capsule Take 1,000 Units by mouth daily.      . furosemide (LASIX) 20 MG tablet Take one per day only as needed for swelling in feet/ankles. 30 tablet 11  . losartan (COZAAR) 100 MG tablet TAKE 1 TABLET ONCE DAILY. 90 tablet 1  . lovastatin (MEVACOR) 40 MG tablet TAKE 1 TABLET EACH DAY. 30 tablet 11  . metoprolol succinate (TOPROL-XL) 50 MG 24 hr tablet TAKE 1&1/2 TABLETS DAILY AS DIRECTED. 45 tablet 1  . Multiple Vitamins-Minerals (CEROVITE PO) 1 tab po qd     . nitroGLYCERIN (NITROSTAT) 0.4 MG SL tablet Place 1 tablet (0.4 mg total) under the tongue every 5 (five) minutes as needed. 25 tablet 12   No current facility-administered medications for this visit.     Past Medical History  Diagnosis Date  . Hyperlipidemia   . HTN  (hypertension)   . Arthritis   . ARTHRITIS, RHEUMATOID, HX OF 01/06/2007  . MITRAL REGURGITATION 01/06/2007  . OSTEOARTHRITIS 01/14/2007  . OSTEOPOROSIS 01/14/2007  . Other specified forms of hearing loss 08/01/2009  . PACEMAKER, PERMANENT 01/06/2007  . Raynaud's syndrome 01/06/2007    Past Surgical History  Procedure Laterality Date  . Mitral valve repair      History   Social History  . Marital Status: Widowed    Spouse Name: N/A  . Number of Children: N/A  . Years of Education: N/A   Occupational History  . retired Catering manager    Social History Main Topics  . Smoking status: Never Smoker   . Smokeless tobacco: Never Used  . Alcohol Use: No  . Drug Use: No  . Sexual Activity: Not on file   Other Topics Concern  . Not on file   Social History Narrative    ROS: no fevers or chills, productive cough, hemoptysis, dysphasia, odynophagia, melena, hematochezia, dysuria, hematuria, rash, seizure activity, orthopnea, PND, pedal edema, claudication. Remaining systems are negative.  Physical Exam: Well-developed well-nourished in no acute distress.  Skin is warm and dry.  HEENT is normal.  Neck is supple.  Chest is clear to auscultation with normal expansion.  Cardiovascular exam is regular rate and rhythm. 1/6 systolic murmur apex. Abdominal exam nontender or distended. No masses palpated. Extremities show trace edema. neuro grossly intact  ECG  AV paced.

## 2014-08-12 ENCOUNTER — Ambulatory Visit (INDEPENDENT_AMBULATORY_CARE_PROVIDER_SITE_OTHER): Payer: Medicare Other | Admitting: Cardiology

## 2014-08-12 ENCOUNTER — Encounter: Payer: Self-pay | Admitting: Cardiology

## 2014-08-12 VITALS — BP 132/88 | HR 65 | Ht 64.0 in | Wt 108.6 lb

## 2014-08-12 DIAGNOSIS — E785 Hyperlipidemia, unspecified: Secondary | ICD-10-CM | POA: Diagnosis not present

## 2014-08-12 DIAGNOSIS — I10 Essential (primary) hypertension: Secondary | ICD-10-CM | POA: Diagnosis not present

## 2014-08-12 DIAGNOSIS — I080 Rheumatic disorders of both mitral and aortic valves: Secondary | ICD-10-CM

## 2014-08-12 NOTE — Assessment & Plan Note (Signed)
Followed by electrophysiology. 

## 2014-08-12 NOTE — Assessment & Plan Note (Signed)
Continue statin. 

## 2014-08-12 NOTE — Assessment & Plan Note (Signed)
Status post previous mitral valve repair. Continue SBE prophylaxis.

## 2014-08-12 NOTE — Patient Instructions (Signed)
Your physician wants you to follow-up in: ONE YEAR WITH DR CRENSHAW You will receive a reminder letter in the mail two months in advance. If you don't receive a letter, please call our office to schedule the follow-up appointment.  

## 2014-08-12 NOTE — Assessment & Plan Note (Signed)
Continue present blood pressure medications. 

## 2014-08-20 ENCOUNTER — Other Ambulatory Visit: Payer: Self-pay | Admitting: *Deleted

## 2014-08-20 ENCOUNTER — Other Ambulatory Visit: Payer: Self-pay | Admitting: Cardiology

## 2014-08-20 MED ORDER — METOPROLOL SUCCINATE ER 50 MG PO TB24: ORAL_TABLET | ORAL | Status: DC

## 2014-08-29 ENCOUNTER — Telehealth: Payer: Self-pay

## 2014-08-29 NOTE — Telephone Encounter (Signed)
Call to patient to introduce AWV; states she was in to see Dr. Jonny Ruiz in Feb and is due to come back in July or August. Will schedule an apt closer to the date with Dr. Jonny Ruiz. Stated that if she would like, AWV could be done prior to visit with Dr. Jonny Ruiz, but it is up to her. Doing ok for now; Lives alone. Will fup in July for scheduled apt.

## 2014-09-25 ENCOUNTER — Telehealth: Payer: Self-pay | Admitting: Cardiology

## 2014-09-25 ENCOUNTER — Ambulatory Visit (INDEPENDENT_AMBULATORY_CARE_PROVIDER_SITE_OTHER): Payer: Medicare Other | Admitting: *Deleted

## 2014-09-25 DIAGNOSIS — I495 Sick sinus syndrome: Secondary | ICD-10-CM

## 2014-09-25 LAB — CUP PACEART REMOTE DEVICE CHECK
Battery Remaining Longevity: 24 mo
Brady Statistic AS VP Percent: 19 %
Brady Statistic AS VS Percent: 0 %
Date Time Interrogation Session: 20160518163848
Lead Channel Impedance Value: 371 Ohm
Lead Channel Pacing Threshold Amplitude: 1.25 V
Lead Channel Pacing Threshold Pulse Width: 0.4 ms
Lead Channel Setting Pacing Amplitude: 2 V
Lead Channel Setting Sensing Sensitivity: 2 mV
MDC IDC MSMT BATTERY IMPEDANCE: 2060 Ohm
MDC IDC MSMT BATTERY VOLTAGE: 2.73 V
MDC IDC MSMT LEADCHNL RA IMPEDANCE VALUE: 374 Ohm
MDC IDC MSMT LEADCHNL RA PACING THRESHOLD AMPLITUDE: 0.5 V
MDC IDC MSMT LEADCHNL RA PACING THRESHOLD PULSEWIDTH: 0.4 ms
MDC IDC SET LEADCHNL RV PACING AMPLITUDE: 2.5 V
MDC IDC SET LEADCHNL RV PACING PULSEWIDTH: 0.4 ms
MDC IDC STAT BRADY AP VP PERCENT: 81 %
MDC IDC STAT BRADY AP VS PERCENT: 0 %

## 2014-09-25 NOTE — Telephone Encounter (Signed)
Spoke with pt and reminded pt of remote transmission that is due today. Pt verbalized understanding.   

## 2014-09-25 NOTE — Progress Notes (Signed)
Remote pacemaker transmission.   

## 2014-10-18 ENCOUNTER — Encounter: Payer: Self-pay | Admitting: Cardiology

## 2014-10-22 ENCOUNTER — Encounter: Payer: Self-pay | Admitting: Internal Medicine

## 2014-12-10 ENCOUNTER — Encounter: Payer: Self-pay | Admitting: Internal Medicine

## 2014-12-10 ENCOUNTER — Ambulatory Visit (INDEPENDENT_AMBULATORY_CARE_PROVIDER_SITE_OTHER): Payer: Medicare Other | Admitting: Internal Medicine

## 2014-12-10 VITALS — BP 118/78 | HR 89 | Temp 98.1°F | Ht 64.0 in | Wt 107.0 lb

## 2014-12-10 DIAGNOSIS — M7989 Other specified soft tissue disorders: Secondary | ICD-10-CM

## 2014-12-10 DIAGNOSIS — I872 Venous insufficiency (chronic) (peripheral): Secondary | ICD-10-CM | POA: Insufficient documentation

## 2014-12-10 DIAGNOSIS — L509 Urticaria, unspecified: Secondary | ICD-10-CM | POA: Diagnosis not present

## 2014-12-10 DIAGNOSIS — R609 Edema, unspecified: Secondary | ICD-10-CM | POA: Diagnosis not present

## 2014-12-10 DIAGNOSIS — I10 Essential (primary) hypertension: Secondary | ICD-10-CM

## 2014-12-10 MED ORDER — PREDNISONE 10 MG PO TABS: ORAL_TABLET | ORAL | Status: DC

## 2014-12-10 MED ORDER — METHYLPREDNISOLONE ACETATE 80 MG/ML IJ SUSP
80.0000 mg | Freq: Once | INTRAMUSCULAR | Status: AC
  Administered 2014-12-10: 80 mg via INTRAMUSCULAR

## 2014-12-10 MED ORDER — FUROSEMIDE 20 MG PO TABS: ORAL_TABLET | ORAL | Status: DC

## 2014-12-10 NOTE — Addendum Note (Signed)
Addended by: Anselm Jungling on: 12/10/2014 05:27 PM   Modules accepted: Orders

## 2014-12-10 NOTE — Patient Instructions (Signed)
You had the steroid shot today   Please take all new medication as prescribed - the prednisone  You can also take the benadryl 50 m every 6 hrs as needed for itching and rash (but you should hold if this makes you too sleepy)  You can also take OTC zantac 150 mg which also can help with itching since it is anithistamine  Please continue all other medications as before, and refills have been done if requested.  Please have the pharmacy call with any other refills you may need.  Please keep your appointments with your specialists as you may have planned  You will be contacted regarding the referral for: Right leg venous doppler asap (to check for blood clot)  You will be contacted by phone if any changes need to be made immediately.  Otherwise, you will receive a letter about your results with an explanation, but please check with MyChart first.  Please remember to sign up for MyChart if you have not done so, as this will be important to you in the future with finding out test results, communicating by private email, and scheduling acute appointments online when needed.

## 2014-12-10 NOTE — Progress Notes (Signed)
Pre visit review using our clinic review tool, if applicable. No additional management support is needed unless otherwise documented below in the visit note. 

## 2014-12-10 NOTE — Assessment & Plan Note (Signed)
Acute onset, etiology unclear, doubt med related, for depomedrol IM, predpac asd, benadryl 50 q 6 prn, and zantac 150 daily, to be very carefull driving and warned of possible side effect such as sedation and hallucinations with this tx

## 2014-12-10 NOTE — Assessment & Plan Note (Signed)
stable overall by history and exam, recent data reviewed with pt, and pt to continue medical treatment as before,  to f/u any worsening symptoms or concerns BP Readings from Last 3 Encounters:  12/10/14 118/78  08/12/14 132/88  06/18/14 160/80

## 2014-12-10 NOTE — Assessment & Plan Note (Signed)
S/p bruising right toes with trauma but leg swelling out of proportion, has little other pain or sob, LLE ok, but will need RLE venous doppler u/s

## 2014-12-10 NOTE — Progress Notes (Signed)
Subjective:    Patient ID: Alexis Cobb, female    DOB: 1924/07/27, 81 y.o.   MRN: [id number]  HPI    Here with 2-3 days onset diffuse hive like rash with itching primarily to arms and torso, without tongue or lip swelling, Pt denies chest pain, increased sob or doe, wheezing, orthopnea, PND, increased LE swelling, palpitations, dizziness or syncope.  Pt denies new neurological symptoms such as new headache, or facial or extremity weakness or numbness   Pt denies polydipsia, polyuria.    Did stub several toes 1 wk ago on furniture with bruised toes for several days, but then also new onset swelling to the RLE only to above the knee, seems to go away some at night, returns the next day, some better today with support hose, no pain, redness. No LLE swelling or pain as well. No fever Past Medical History  Diagnosis Date  . Hyperlipidemia   . HTN (hypertension)   . Arthritis   . ARTHRITIS, RHEUMATOID, HX OF 01/06/2007  . MITRAL REGURGITATION 01/06/2007  . OSTEOARTHRITIS 01/14/2007  . OSTEOPOROSIS 01/14/2007  . Other specified forms of hearing loss 08/01/2009  . PACEMAKER, PERMANENT 01/06/2007  . Raynaud's syndrome 01/06/2007   Past Surgical History  Procedure Laterality Date  . Mitral valve repair      reports that she has never smoked. She has never used smokeless tobacco. She reports that she does not drink alcohol or use illicit drugs. family history includes Coronary artery disease in an other family member; Hypertension in an other family member. Allergies  Allergen Reactions  . Ace Inhibitors Rash  . Cephalexin Rash  . Penicillins Rash  . Risedronate Sodium Rash   Current Outpatient Prescriptions on File Prior to Visit  Medication Sig Dispense Refill  . amitriptyline (ELAVIL) 25 MG tablet Take 1 tablet (25 mg total) by mouth at bedtime as needed for sleep. 90 tablet 1  . aspirin 81 MG tablet Take 81 mg by mouth daily.      . Calcium Carbonate-Vitamin D (CALCIUM + D PO) Take by  mouth daily.      . Cholecalciferol (VITAMIN D) 1000 UNITS capsule Take 1,000 Units by mouth daily.      Marland Kitchen losartan (COZAAR) 100 MG tablet TAKE 1 TABLET ONCE DAILY. 90 tablet 1  . lovastatin (MEVACOR) 40 MG tablet TAKE 1 TABLET EACH DAY. 30 tablet 11  . metoprolol succinate (TOPROL-XL) 50 MG 24 hr tablet TAKE 1&1/2 TABLETS DAILY AS DIRECTED. 45 tablet 11  . Multiple Vitamins-Minerals (CEROVITE PO) 1 tab po qd     . nitroGLYCERIN (NITROSTAT) 0.4 MG SL tablet Place 1 tablet (0.4 mg total) under the tongue every 5 (five) minutes as needed. 25 tablet 12   No current facility-administered medications on file prior to visit.    Review of Systems  Constitutional: Negative for unusual diaphoresis or night sweats HENT: Negative for ringing in ear or discharge Eyes: Negative for double vision or worsening visual disturbance.  Respiratory: Negative for choking and stridor.   Gastrointestinal: Negative for vomiting or other signifcant bowel change Genitourinary: Negative for hematuria or change in urine volume.  Musculoskeletal: Negative for other MSK pain or swelling Skin: Negative for color change and worsening wound.  Neurological: Negative for tremors and numbness other than noted  Psychiatric/Behavioral: Negative for decreased concentration or agitation other than above       Objective:   Physical Exam BP 118/78 mmHg  Pulse 89  Temp(Src) 98.1 F (36.7 C) (  Oral)  Ht 5\' 4"  (1.626 m)  Wt 107 lb (48.535 kg)  BMI 18.36 kg/m2  SpO2 96% VS noted, not ill appearing Constitutional: Pt appears in no significant distress HENT: Head: NCAT.  Right Ear: External ear normal.  Left Ear: External ear normal.  Eyes: . Pupils are equal, round, and reactive to light. Conjunctivae and EOM are normal Neck: Normal range of motion. Neck supple.  Cardiovascular: Normal rate and regular rhythm.   Pulmonary/Chest: Effort normal and breath sounds without rales or wheezing.  Abd:  Soft, NT, ND, +  BS Neurological: Pt is alert. Not confused , motor grossly intact Skin: Skin is warm. Difufse hive like rash with various size wheal and flare, some large and plaquelike, 1-2+ RLE edema to mid right thigh, no erythema, ulcer, tender or knee effusion Psychiatric: Pt behavior is normal. No agitation.     Assessment & Plan:

## 2014-12-19 ENCOUNTER — Ambulatory Visit (HOSPITAL_COMMUNITY)
Admission: RE | Admit: 2014-12-19 | Discharge: 2014-12-19 | Disposition: A | Discharge status: Home / Self Care | Payer: Medicare Other | Source: Ambulatory Visit | Attending: Internal Medicine | Admitting: Internal Medicine

## 2014-12-19 ENCOUNTER — Other Ambulatory Visit: Payer: Self-pay | Admitting: Internal Medicine

## 2014-12-19 ENCOUNTER — Other Ambulatory Visit: Payer: Self-pay | Admitting: Cardiology

## 2014-12-19 DIAGNOSIS — M7989 Other specified soft tissue disorders: Secondary | ICD-10-CM

## 2014-12-19 NOTE — Telephone Encounter (Signed)
REFILL 

## 2015-01-23 ENCOUNTER — Telehealth: Payer: Self-pay | Admitting: Internal Medicine

## 2015-01-23 NOTE — Telephone Encounter (Signed)
Instructed pt to send a manual transmission w/ her home monitor. She verbalized understanding. Informed pt to not to drive since she has had syncopal episodes.

## 2015-01-23 NOTE — Telephone Encounter (Signed)
Informed patient that remote was received. Normal device function. Presenting rhythm-ApVp. No episodes. Lead measurements were all stable. I instructed patient to stay well hydrated and call back if anymore episodes occur. Patient voiced understanding and appreciation of information given.

## 2015-01-23 NOTE — Telephone Encounter (Signed)
New message     Pt had 1 episode this week and 1 episode last week where she felt like she was going to pass out/faint; device did not fire or beep Pt states she was ironing at the time of the episodes Pt had been prednisone for a rash Pt also wants to know if she is okay to drive because of these episodes

## 2015-02-19 ENCOUNTER — Ambulatory Visit (INDEPENDENT_AMBULATORY_CARE_PROVIDER_SITE_OTHER): Payer: Medicare Other | Admitting: Internal Medicine

## 2015-02-19 ENCOUNTER — Encounter: Payer: Self-pay | Admitting: Internal Medicine

## 2015-02-19 VITALS — BP 130/90 | HR 80 | Ht 64.0 in | Wt 107.0 lb

## 2015-02-19 DIAGNOSIS — I080 Rheumatic disorders of both mitral and aortic valves: Secondary | ICD-10-CM

## 2015-02-19 DIAGNOSIS — E785 Hyperlipidemia, unspecified: Secondary | ICD-10-CM

## 2015-02-19 DIAGNOSIS — Z950 Presence of cardiac pacemaker: Secondary | ICD-10-CM | POA: Diagnosis not present

## 2015-02-19 DIAGNOSIS — I495 Sick sinus syndrome: Secondary | ICD-10-CM

## 2015-02-19 DIAGNOSIS — I10 Essential (primary) hypertension: Secondary | ICD-10-CM

## 2015-02-19 LAB — CUP PACEART INCLINIC DEVICE CHECK
Battery Impedance: 2542 Ohm
Battery Voltage: 2.71 V
Brady Statistic AP VP Percent: 81 %
Brady Statistic AP VS Percent: 0 %
Brady Statistic AS VP Percent: 19 %
Date Time Interrogation Session: 20161012175336
Implantable Lead Implant Date: 20020104
Implantable Lead Implant Date: 20020104
Implantable Lead Location: 753859
Implantable Lead Model: 5076
Lead Channel Impedance Value: 376 Ohm
Lead Channel Impedance Value: 406 Ohm
Lead Channel Pacing Threshold Amplitude: 0.5 V
Lead Channel Pacing Threshold Pulse Width: 0.4 ms
Lead Channel Sensing Intrinsic Amplitude: 1.4 mV
Lead Channel Setting Pacing Amplitude: 2 V
Lead Channel Setting Pacing Pulse Width: 0.4 ms
MDC IDC LEAD LOCATION: 753860
MDC IDC MSMT BATTERY REMAINING LONGEVITY: 20 mo
MDC IDC MSMT LEADCHNL RV PACING THRESHOLD AMPLITUDE: 1.25 V
MDC IDC MSMT LEADCHNL RV PACING THRESHOLD PULSEWIDTH: 0.4 ms
MDC IDC MSMT LEADCHNL RV SENSING INTR AMPL: 2.8 mV
MDC IDC SET LEADCHNL RV PACING AMPLITUDE: 2.5 V
MDC IDC SET LEADCHNL RV SENSING SENSITIVITY: 2 mV
MDC IDC SET ZONE DETECTION INTERVAL: 400 ms
MDC IDC SET ZONE DETECTION INTERVAL: 400 ms
MDC IDC STAT BRADY AS VS PERCENT: 0 %

## 2015-02-19 NOTE — Assessment & Plan Note (Signed)
Her medtronic DDD PM is working normally. Will recheck in several months. 

## 2015-02-19 NOTE — Patient Instructions (Signed)
Medication Instructions:  Your physician recommends that you continue on your current medications as directed. Please refer to the Current Medication list given to you today.   Labwork: None ordered   Testing/Procedures: None ordered   Follow-Up: Your physician wants you to follow-up in: 12 months with Dr Court Joy will receive a reminder letter in the mail two months in advance. If you don't receive a letter, please call our office to schedule the follow-up appointment.  Remote monitoring is used to monitor your Pacemaker  from home. This monitoring reduces the number of office visits required to check your device to one time per year. It allows [place] to keep an eye on the functioning of your device to ensure it is working properly. You are scheduled for a device check from home on 05/21/2015. You may send your transmission at any time that day. If you have a wireless device, the transmission will be sent automatically. After your physician reviews your transmission, you will receive a postcard with your next transmission date.     Any Other Special Instructions Will Be Listed Below (If Applicable).

## 2015-02-19 NOTE — Assessment & Plan Note (Signed)
She is quite thin. She will continue her statin.

## 2015-02-19 NOTE — Assessment & Plan Note (Signed)
Her blood pressure is fairly well controlled. She is encouraged to maintain a low sodium diet.

## 2015-02-19 NOTE — Progress Notes (Signed)
HPI Mr. Traub returns today for followup. She is a pleasant 81 yo woman with a h/o symptomatic bradycardia secondary to complete heart block, s/p PPM insertion. She notes mild sob. No syncope, chest pain, fever or chills. Minimal peripheral edema. She notes that she has had 2 episode where she got dizzy and had to sit down.   Allergies  Allergen Reactions  . Ace Inhibitors Rash  . Cephalexin Rash  . Penicillins Rash  . Risedronate Sodium Rash     Current Outpatient Prescriptions  Medication Sig Dispense Refill  . amitriptyline (ELAVIL) 25 MG tablet Take 1 tablet (25 mg total) by mouth at bedtime as needed for sleep. 90 tablet 1  . aspirin 81 MG tablet Take 81 mg by mouth daily.      . Calcium Carbonate-Vitamin D (CALCIUM + D PO) Take by mouth daily.      . Cholecalciferol (VITAMIN D) 1000 UNITS capsule Take 1,000 Units by mouth daily.      . furosemide (LASIX) 20 MG tablet Take one per day only as needed for swelling in feet/ankles. 30 tablet 5  . losartan (COZAAR) 100 MG tablet Take 100 mg by mouth daily.    Marland Kitchen lovastatin (MEVACOR) 40 MG tablet Take 40 mg by mouth at bedtime.    . metoprolol succinate (TOPROL-XL) 50 MG 24 hr tablet Take 50 mg by mouth daily. Take one and 1/2 tablets by mouth daily    . Multiple Vitamin (MULTIVITAMIN) capsule Take 1 capsule by mouth daily.    . nitroGLYCERIN (NITROSTAT) 0.4 MG SL tablet Place 1 tablet (0.4 mg total) under the tongue every 5 (five) minutes as needed. 25 tablet 12   No current facility-administered medications for this visit.     Past Medical History  Diagnosis Date  . Hyperlipidemia   . HTN (hypertension)   . Arthritis   . ARTHRITIS, RHEUMATOID, HX OF 01/06/2007  . MITRAL REGURGITATION 01/06/2007  . OSTEOARTHRITIS 01/14/2007  . OSTEOPOROSIS 01/14/2007  . Other specified forms of hearing loss 08/01/2009  . PACEMAKER, PERMANENT 01/06/2007  . Raynaud's syndrome 01/06/2007    ROS:   All systems reviewed and negative except as noted  in the HPI.   Past Surgical History  Procedure Laterality Date  . Mitral valve repair       Family History  Problem Relation Age of Onset  . Coronary artery disease    . Hypertension       Social History   Social History  . Marital Status: Widowed    Spouse Name: N/A  . Number of Children: N/A  . Years of Education: N/A   Occupational History  . retired Catering manager    Social History Main Topics  . Smoking status: Never Smoker   . Smokeless tobacco: Never Used  . Alcohol Use: No  . Drug Use: No  . Sexual Activity: Not on file   Other Topics Concern  . Not on file   Social History Narrative     BP 130/90 mmHg  Pulse 80  Ht 5\' 4"  (1.626 m)  Wt 107 lb (48.535 kg)  BMI 18.36 kg/m2  Physical Exam:  Frail appearing elderly woman, NAD HEENT: Unremarkable Neck:  7 cm JVD, no thyromegally Lungs:  Clear with no wheezes HEART:  Regular rate rhythm, no murmurs, no rubs, no clicks Abd:  soft, positive bowel sounds, no organomegally, no rebound, no guarding Ext:  2 plus pulses, no edema, no cyanosis, no clubbing Skin:  No rashes no nodules Neuro:  CN II through XII intact, motor grossly intact   DEVICE  Normal device function.  See PaceArt for details.   Assess/Plan:

## 2015-05-21 ENCOUNTER — Telehealth: Payer: Self-pay | Admitting: Cardiology

## 2015-05-21 ENCOUNTER — Ambulatory Visit (INDEPENDENT_AMBULATORY_CARE_PROVIDER_SITE_OTHER): Payer: Medicare Other | Admitting: *Deleted

## 2015-05-21 DIAGNOSIS — I495 Sick sinus syndrome: Secondary | ICD-10-CM

## 2015-05-21 NOTE — Telephone Encounter (Signed)
Spoke with pt and reminded pt of remote transmission that is due today. Pt verbalized understanding.   

## 2015-05-22 NOTE — Progress Notes (Signed)
Remote pacemaker transmission.   

## 2015-05-30 ENCOUNTER — Encounter: Payer: Self-pay | Admitting: Internal Medicine

## 2015-05-30 ENCOUNTER — Ambulatory Visit (INDEPENDENT_AMBULATORY_CARE_PROVIDER_SITE_OTHER): Payer: Medicare Other | Admitting: Internal Medicine

## 2015-05-30 VITALS — BP 146/84 | HR 82 | Temp 98.0°F | Ht 64.0 in | Wt 113.0 lb

## 2015-05-30 DIAGNOSIS — R21 Rash and other nonspecific skin eruption: Secondary | ICD-10-CM | POA: Diagnosis not present

## 2015-05-30 DIAGNOSIS — I872 Venous insufficiency (chronic) (peripheral): Secondary | ICD-10-CM | POA: Diagnosis not present

## 2015-05-30 DIAGNOSIS — I10 Essential (primary) hypertension: Secondary | ICD-10-CM | POA: Diagnosis not present

## 2015-05-30 NOTE — Patient Instructions (Signed)
For swelling, please wear compression stockings or support hose, try to get regular exercise, do leg elevation if you are sitting, and low salt in your diet  For Rash, we will refer Dermatology  Please continue all other medications as before, and refills have been done if requested.  Please have the pharmacy call with any other refills you may need.  Please keep your appointments with your specialists as you may have planned

## 2015-05-30 NOTE — Progress Notes (Signed)
Pre visit review using our clinic review tool, if applicable. No additional management support is needed unless otherwise documented below in the visit note. 

## 2015-05-31 DIAGNOSIS — R21 Rash and other nonspecific skin eruption: Secondary | ICD-10-CM | POA: Insufficient documentation

## 2015-05-31 NOTE — Progress Notes (Signed)
Subjective:    Patient ID: Alexis Cobb, female    DOB: 1924-09-04, 82 y.o.   MRN: [id number]  HPI  Here with persistent right > left LE edema, mild overall, always improved at night and the AM, but worse later in the day, ongoing since at least aug 2016, venous doppler neg at that time.  Cannot wear the compression stockings due to not being able to put on or get assist.  Now with worsening non itching non painful erythem rash below the knee but more prox to brownish  Lotion no help.  No overt contact to cause rash Past Medical History  Diagnosis Date  . Hyperlipidemia   . HTN (hypertension)   . Arthritis   . ARTHRITIS, RHEUMATOID, HX OF 01/06/2007  . MITRAL REGURGITATION 01/06/2007  . OSTEOARTHRITIS 01/14/2007  . OSTEOPOROSIS 01/14/2007  . Other specified forms of hearing loss 08/01/2009  . PACEMAKER, PERMANENT 01/06/2007  . Raynaud's syndrome 01/06/2007   Past Surgical History  Procedure Laterality Date  . Mitral valve repair      reports that she has never smoked. She has never used smokeless tobacco. She reports that she does not drink alcohol or use illicit drugs. family history is not on file. Allergies  Allergen Reactions  . Ace Inhibitors Rash  . Cephalexin Rash  . Penicillins Rash  . Risedronate Sodium Rash   Current Outpatient Prescriptions on File Prior to Visit  Medication Sig Dispense Refill  . amitriptyline (ELAVIL) 25 MG tablet Take 1 tablet (25 mg total) by mouth at bedtime as needed for sleep. 90 tablet 1  . aspirin 81 MG tablet Take 81 mg by mouth daily.      . Calcium Carbonate-Vitamin D (CALCIUM + D PO) Take by mouth daily.      . Cholecalciferol (VITAMIN D) 1000 UNITS capsule Take 1,000 Units by mouth daily.      . furosemide (LASIX) 20 MG tablet Take one per day only as needed for swelling in feet/ankles. 30 tablet 5  . losartan (COZAAR) 100 MG tablet Take 100 mg by mouth daily.    Marland Kitchen lovastatin (MEVACOR) 40 MG tablet Take 40 mg by mouth at bedtime.    .  metoprolol succinate (TOPROL-XL) 50 MG 24 hr tablet Take 50 mg by mouth daily. Take one and 1/2 tablets by mouth daily    . Multiple Vitamin (MULTIVITAMIN) capsule Take 1 capsule by mouth daily.    . nitroGLYCERIN (NITROSTAT) 0.4 MG SL tablet Place 1 tablet (0.4 mg total) under the tongue every 5 (five) minutes as needed. 25 tablet 12   No current facility-administered medications on file prior to visit.   Review of Systems  Constitutional: Negative for unusual diaphoresis or night sweats HENT: Negative for ringing in ear or discharge Eyes: Negative for double vision or worsening visual disturbance.  Respiratory: Negative for choking and stridor.   Gastrointestinal: Negative for vomiting or other signifcant bowel change Genitourinary: Negative for hematuria or change in urine volume.  Musculoskeletal: Negative for other MSK pain or swelling Skin: Negative for color change and worsening wound.  Neurological: Negative for tremors and numbness other than noted  Psychiatric/Behavioral: Negative for decreased concentration or agitation other than above       Objective:   Physical Exam BP 146/84 mmHg  Pulse 82  Temp(Src) 98 F (36.7 C) (Oral)  Ht 5\' 4"  (1.626 m)  Wt 113 lb (51.256 kg)  BMI 19.39 kg/m2  SpO2 98% VS noted,  Constitutional: Pt appears  in no significant distress HENT: Head: NCAT.  Right Ear: External ear normal.  Left Ear: External ear normal.  Eyes: . Pupils are equal, round, and reactive to light. Conjunctivae and EOM are normal Neck: Normal range of motion. Neck supple.  Cardiovascular: Normal rate and regular rhythm.   Pulmonary/Chest: Effort normal and breath sounds without rales or wheezing.  Neurological: Pt is alert. Not confused , motor grossly intact Skin: Skin is warm.  Brawny brownish pretibial rash, note with numerous small erythem non raised spot/lesions more proxima but below knee, 1+  RLE edema, trace to 1+ on left, with numerous  varicosities Psychiatric: Pt behavior is normal. No agitation.      Assessment & Plan:

## 2015-05-31 NOTE — Assessment & Plan Note (Signed)
stable overall by history and exam, recent data reviewed with pt, and pt to continue medical treatment as before,  to f/u any worsening symptoms or concerns BP Readings from Last 3 Encounters:  05/30/15 146/84  02/19/15 130/90  12/10/14 118/78

## 2015-05-31 NOTE — Assessment & Plan Note (Signed)
For support stockings, leg elevation, low salt diet,  to f/u any worsening symptoms or concerns

## 2015-05-31 NOTE — Assessment & Plan Note (Signed)
C/w prob flare stasis dermatitis vs vasculitis, for derm referral

## 2015-06-01 LAB — CUP PACEART REMOTE DEVICE CHECK
Battery Voltage: 2.71 V
Brady Statistic AP VS Percent: 0 %
Date Time Interrogation Session: 20170111184703
Implantable Lead Location: 753859
Implantable Lead Model: 5076
Lead Channel Pacing Threshold Pulse Width: 0.4 ms
Lead Channel Pacing Threshold Pulse Width: 0.4 ms
Lead Channel Setting Pacing Amplitude: 2 V
Lead Channel Setting Pacing Pulse Width: 0.4 ms
Lead Channel Setting Sensing Sensitivity: 2 mV
MDC IDC LEAD IMPLANT DT: 20020104
MDC IDC LEAD IMPLANT DT: 20020104
MDC IDC LEAD LOCATION: 753860
MDC IDC MSMT BATTERY IMPEDANCE: 2904 Ohm
MDC IDC MSMT BATTERY REMAINING LONGEVITY: 16 mo
MDC IDC MSMT LEADCHNL RA IMPEDANCE VALUE: 371 Ohm
MDC IDC MSMT LEADCHNL RA PACING THRESHOLD AMPLITUDE: 0.5 V
MDC IDC MSMT LEADCHNL RV IMPEDANCE VALUE: 392 Ohm
MDC IDC MSMT LEADCHNL RV PACING THRESHOLD AMPLITUDE: 1.25 V
MDC IDC SET LEADCHNL RV PACING AMPLITUDE: 2.5 V
MDC IDC STAT BRADY AP VP PERCENT: 80 %
MDC IDC STAT BRADY AS VP PERCENT: 20 %
MDC IDC STAT BRADY AS VS PERCENT: 0 %

## 2015-06-06 ENCOUNTER — Encounter: Payer: Self-pay | Admitting: Cardiology

## 2015-08-20 ENCOUNTER — Ambulatory Visit (INDEPENDENT_AMBULATORY_CARE_PROVIDER_SITE_OTHER): Payer: Medicare Other | Admitting: *Deleted

## 2015-08-20 ENCOUNTER — Telehealth: Payer: Self-pay | Admitting: Cardiology

## 2015-08-20 DIAGNOSIS — I495 Sick sinus syndrome: Secondary | ICD-10-CM

## 2015-08-20 NOTE — Telephone Encounter (Signed)
Spoke with pt and reminded pt of remote transmission that is due today. Pt verbalized understanding.   

## 2015-08-20 NOTE — Progress Notes (Signed)
Remote pacemaker transmission.   

## 2015-08-28 ENCOUNTER — Other Ambulatory Visit: Payer: Self-pay | Admitting: Cardiology

## 2015-08-28 NOTE — Telephone Encounter (Signed)
Rx refill sent to pharmacy. 

## 2015-09-30 ENCOUNTER — Encounter: Payer: Self-pay | Admitting: Cardiology

## 2015-09-30 LAB — CUP PACEART REMOTE DEVICE CHECK
Battery Voltage: 2.71 V
Brady Statistic AP VS Percent: 0 %
Brady Statistic AS VP Percent: 21 %
Brady Statistic AS VS Percent: 0 %
Date Time Interrogation Session: 20170412150512
Implantable Lead Implant Date: 20020104
Implantable Lead Location: 753859
Implantable Lead Model: 5076
Lead Channel Impedance Value: 402 Ohm
Lead Channel Pacing Threshold Amplitude: 0.625 V
Lead Channel Pacing Threshold Amplitude: 1.25 V
Lead Channel Pacing Threshold Pulse Width: 0.4 ms
Lead Channel Pacing Threshold Pulse Width: 0.4 ms
Lead Channel Setting Pacing Amplitude: 2 V
Lead Channel Setting Sensing Sensitivity: 2 mV
MDC IDC LEAD IMPLANT DT: 20020104
MDC IDC LEAD LOCATION: 753860
MDC IDC MSMT BATTERY IMPEDANCE: 3050 Ohm
MDC IDC MSMT BATTERY REMAINING LONGEVITY: 15 mo
MDC IDC MSMT LEADCHNL RA IMPEDANCE VALUE: 382 Ohm
MDC IDC MSMT LEADCHNL RA SENSING INTR AMPL: 1 mV
MDC IDC SET LEADCHNL RV PACING AMPLITUDE: 2.5 V
MDC IDC SET LEADCHNL RV PACING PULSEWIDTH: 0.46 ms
MDC IDC STAT BRADY AP VP PERCENT: 79 %

## 2015-10-02 ENCOUNTER — Other Ambulatory Visit: Payer: Self-pay | Admitting: Cardiology

## 2015-11-10 NOTE — Progress Notes (Signed)
HPI: FU history of mitral valve repair secondary to mitral regurgitation. She has also had a previous pacemaker placed and history of nonobstructive coronary disease by catheterization in 2003. She also had a Myoview performed on January 16, 2008. Her ejection fraction of 78% and there was no ischemia or infarction. Last echocardiogram in January 2014 showed normal LV function, previous mitral valve repair with trivial mitral regurgitation, mild right ventricular enlargement. Since I last saw her, She has some dyspnea on exertion but no orthopnea, PND, palpitations, syncope or chest pain. Occasional mild pedal edema.  Current Outpatient Prescriptions  Medication Sig Dispense Refill  . amitriptyline (ELAVIL) 25 MG tablet Take 1 tablet (25 mg total) by mouth at bedtime as needed for sleep. 90 tablet 1  . aspirin 81 MG tablet Take 81 mg by mouth daily.      . Calcium Carbonate-Vitamin D (CALCIUM + D PO) Take by mouth daily.      . Cholecalciferol (VITAMIN D) 1000 UNITS capsule Take 1,000 Units by mouth daily.      . furosemide (LASIX) 20 MG tablet Take one per day only as needed for swelling in feet/ankles. 30 tablet 5  . losartan (COZAAR) 100 MG tablet Take 1 tablet (100 mg total) by mouth daily. KEEP OV. 90 tablet 0  . lovastatin (MEVACOR) 40 MG tablet Take 40 mg by mouth at bedtime.    . metoprolol succinate (TOPROL-XL) 50 MG 24 hr tablet TAKE 1 AND 1/2 TABLETS DAILY. KEEP OV. 135 tablet 0  . Multiple Vitamin (MULTIVITAMIN) capsule Take 1 capsule by mouth daily.    . nitroGLYCERIN (NITROSTAT) 0.4 MG SL tablet Place 1 tablet (0.4 mg total) under the tongue every 5 (five) minutes as needed. 25 tablet 12   No current facility-administered medications for this visit.     Past Medical History  Diagnosis Date  . Hyperlipidemia   . HTN (hypertension)   . Arthritis   . ARTHRITIS, RHEUMATOID, HX OF 01/06/2007  . MITRAL REGURGITATION 01/06/2007  . OSTEOARTHRITIS 01/14/2007  . OSTEOPOROSIS  01/14/2007  . Other specified forms of hearing loss 08/01/2009  . PACEMAKER, PERMANENT 01/06/2007  . Raynaud's syndrome 01/06/2007    Past Surgical History  Procedure Laterality Date  . Mitral valve repair      Social History   Social History  . Marital Status: Widowed    Spouse Name: N/A  . Number of Children: N/A  . Years of Education: N/A   Occupational History  . retired Catering manager    Social History Main Topics  . Smoking status: Never Smoker   . Smokeless tobacco: Never Used  . Alcohol Use: No  . Drug Use: No  . Sexual Activity: Not on file   Other Topics Concern  . Not on file   Social History Narrative    Family History  Problem Relation Age of Onset  . Coronary artery disease    . Hypertension      ROS: no fevers or chills, productive cough, hemoptysis, dysphasia, odynophagia, melena, hematochezia, dysuria, hematuria, rash, seizure activity, orthopnea, PND, pedal edema, claudication. Remaining systems are negative.  Physical Exam: Well-developed frail in no acute distress.  Skin is warm and dry.  HEENT is normal.  Neck is supple.  Chest is clear to auscultation with normal expansion.  Cardiovascular exam is regular rate and rhythm.  Abdominal exam nontender or distended. No masses palpated. Extremities show trace edema. neuro grossly intact  ECG AV paced rhythm   Assessment  and plan  1 hypertension-Blood pressure controlled. Continue present medications. 2 hyperlipidemia-continue statin. 3 permanent pacemaker-followed by electrophysiology. 4 history of mitral valve repair-continue SBE prophylaxis.  Olga Millers, MD

## 2015-11-14 ENCOUNTER — Encounter: Payer: Self-pay | Admitting: Cardiology

## 2015-11-14 ENCOUNTER — Ambulatory Visit (INDEPENDENT_AMBULATORY_CARE_PROVIDER_SITE_OTHER): Payer: Medicare Other | Admitting: Cardiology

## 2015-11-14 VITALS — BP 126/80 | HR 84 | Ht 64.0 in | Wt 108.0 lb

## 2015-11-14 DIAGNOSIS — E785 Hyperlipidemia, unspecified: Secondary | ICD-10-CM | POA: Diagnosis not present

## 2015-11-14 DIAGNOSIS — Z950 Presence of cardiac pacemaker: Secondary | ICD-10-CM | POA: Diagnosis not present

## 2015-11-14 DIAGNOSIS — I080 Rheumatic disorders of both mitral and aortic valves: Secondary | ICD-10-CM

## 2015-11-14 DIAGNOSIS — I10 Essential (primary) hypertension: Secondary | ICD-10-CM | POA: Diagnosis not present

## 2015-11-14 NOTE — Patient Instructions (Signed)
Your physician wants you to follow-up in: 1 year with Dr. Crenshaw. You will receive a reminder letter in the mail two months in advance. If you don't receive a letter, please call our office to schedule the follow-up appointment.   If you need a refill on your cardiac medications before your next appointment, please call your pharmacy.   

## 2015-11-19 ENCOUNTER — Ambulatory Visit (INDEPENDENT_AMBULATORY_CARE_PROVIDER_SITE_OTHER): Payer: Medicare Other | Admitting: *Deleted

## 2015-11-19 ENCOUNTER — Telehealth: Payer: Self-pay | Admitting: Cardiology

## 2015-11-19 DIAGNOSIS — I495 Sick sinus syndrome: Secondary | ICD-10-CM

## 2015-11-19 NOTE — Telephone Encounter (Signed)
Spoke with pt and reminded pt of remote transmission that is due today. Pt verbalized understanding.   

## 2015-11-19 NOTE — Progress Notes (Signed)
Remote pacemaker transmission.   

## 2015-11-21 ENCOUNTER — Encounter: Payer: Self-pay | Admitting: Cardiology

## 2015-11-21 LAB — CUP PACEART REMOTE DEVICE CHECK
Battery Impedance: 3304 Ohm
Battery Remaining Longevity: 13 mo
Battery Voltage: 2.69 V
Brady Statistic AP VP Percent: 80 %
Date Time Interrogation Session: 20170712161933
Implantable Lead Implant Date: 20020104
Implantable Lead Location: 753859
Implantable Lead Location: 753860
Lead Channel Impedance Value: 375 Ohm
Lead Channel Setting Pacing Amplitude: 2.5 V
Lead Channel Setting Pacing Pulse Width: 0.4 ms
MDC IDC LEAD IMPLANT DT: 20020104
MDC IDC MSMT LEADCHNL RA PACING THRESHOLD AMPLITUDE: 0.625 V
MDC IDC MSMT LEADCHNL RA PACING THRESHOLD PULSEWIDTH: 0.4 ms
MDC IDC MSMT LEADCHNL RV IMPEDANCE VALUE: 369 Ohm
MDC IDC MSMT LEADCHNL RV PACING THRESHOLD AMPLITUDE: 1.25 V
MDC IDC MSMT LEADCHNL RV PACING THRESHOLD PULSEWIDTH: 0.4 ms
MDC IDC SET LEADCHNL RA PACING AMPLITUDE: 2 V
MDC IDC SET LEADCHNL RV SENSING SENSITIVITY: 2 mV
MDC IDC STAT BRADY AP VS PERCENT: 0 %
MDC IDC STAT BRADY AS VP PERCENT: 20 %
MDC IDC STAT BRADY AS VS PERCENT: 0 %

## 2016-01-06 ENCOUNTER — Other Ambulatory Visit: Payer: Self-pay | Admitting: Cardiology

## 2016-01-06 ENCOUNTER — Other Ambulatory Visit: Payer: Self-pay | Admitting: Internal Medicine

## 2016-01-06 DIAGNOSIS — E785 Hyperlipidemia, unspecified: Secondary | ICD-10-CM

## 2016-02-04 ENCOUNTER — Other Ambulatory Visit: Payer: Self-pay | Admitting: Internal Medicine

## 2016-02-04 DIAGNOSIS — E785 Hyperlipidemia, unspecified: Secondary | ICD-10-CM

## 2016-02-16 ENCOUNTER — Encounter: Payer: Self-pay | Admitting: Internal Medicine

## 2016-02-25 ENCOUNTER — Encounter: Payer: Self-pay | Admitting: Internal Medicine

## 2016-02-25 ENCOUNTER — Ambulatory Visit (INDEPENDENT_AMBULATORY_CARE_PROVIDER_SITE_OTHER): Payer: Medicare Other | Admitting: Internal Medicine

## 2016-02-25 VITALS — BP 140/84 | HR 84 | Ht 64.0 in | Wt 103.1 lb

## 2016-02-25 DIAGNOSIS — I495 Sick sinus syndrome: Secondary | ICD-10-CM | POA: Diagnosis not present

## 2016-02-25 NOTE — Progress Notes (Signed)
HPI Mr. Vanderweele returns today for followup. She is a pleasant 82 yo woman with a h/o symptomatic bradycardia secondary to complete heart block, s/p PPM insertion. She notes mild sob. No syncope, chest pain, fever or chills. Minimal peripheral edema. She notes that she has lost some weight. She lives alone and does not like to cook. She continues to become more sedentary. Allergies  Allergen Reactions  . Ace Inhibitors Rash  . Cephalexin Rash  . Penicillins Rash  . Risedronate Sodium Rash     Current Outpatient Prescriptions  Medication Sig Dispense Refill  . aspirin 81 MG tablet Take 81 mg by mouth daily.      . Calcium Carbonate-Vitamin D (CALCIUM + D PO) Take by mouth daily.      . Cholecalciferol (VITAMIN D) 1000 UNITS capsule Take 1,000 Units by mouth daily.      . furosemide (LASIX) 20 MG tablet Take one per day only as needed for swelling in feet/ankles. 30 tablet 5  . losartan (COZAAR) 100 MG tablet Take 100 mg by mouth daily.    Marland Kitchen lovastatin (MEVACOR) 40 MG tablet Take 40 mg by mouth daily.    . metoprolol succinate (TOPROL-XL) 50 MG 24 hr tablet TAKE 1&1/2 TABLETS DAILY AS DIRECTED. 135 tablet 2  . Multiple Vitamin (MULTIVITAMIN) capsule Take 1 capsule by mouth daily.    . nitroGLYCERIN (NITROSTAT) 0.4 MG SL tablet Place 0.4 mg under the tongue every 5 (five) minutes as needed for chest pain.     No current facility-administered medications for this visit.      Past Medical History:  Diagnosis Date  . Arthritis   . ARTHRITIS, RHEUMATOID, HX OF 01/06/2007  . HTN (hypertension)   . Hyperlipidemia   . MITRAL REGURGITATION 01/06/2007  . OSTEOARTHRITIS 01/14/2007  . OSTEOPOROSIS 01/14/2007  . Other specified forms of hearing loss 08/01/2009  . PACEMAKER, PERMANENT 01/06/2007  . Raynaud's syndrome 01/06/2007    ROS:   All systems reviewed and negative except as noted in the HPI.   Past Surgical History:  Procedure Laterality Date  . MITRAL VALVE REPAIR       Family  History  Problem Relation Age of Onset  . Coronary artery disease    . Hypertension       Social History   Social History  . Marital status: Widowed    Spouse name: N/A  . Number of children: N/A  . Years of education: N/A   Occupational History  . retired Catering manager Retired   Social History Main Topics  . Smoking status: Never Smoker  . Smokeless tobacco: Never Used  . Alcohol use No  . Drug use: No  . Sexual activity: Not on file   Other Topics Concern  . Not on file   Social History Narrative  . No narrative on file     BP 140/84   Pulse 84   Ht 5\' 4"  (1.626 m)   Wt 103 lb 2 oz (46.8 kg)   BMI 17.70 kg/m   Physical Exam:  Frail appearing elderly woman, NAD HEENT: Unremarkable Neck:  7 cm JVD, no thyromegally Lungs:  Clear with no wheezes HEART:  Regular rate rhythm, no murmurs, no rubs, no clicks Abd:  soft, positive bowel sounds, no organomegally, no rebound, no guarding Ext:  2 plus pulses, no edema, no cyanosis, no clubbing Skin:  No rashes no nodules Neuro:  CN II through XII intact, motor grossly intact   DEVICE  Normal device function.  See PaceArt for details.   Assess/Plan: 1. CHB - she is asymptomatic, s/p PPM insertion 2. PPM - she is about 10 months from ERI on her medtronic DDD device. Will recheck her device in several months. 3. HTN - she is on multiple medications. Her pressures are reasonably well controlled.   [name].D.

## 2016-02-25 NOTE — Patient Instructions (Signed)

## 2016-02-27 LAB — CUP PACEART INCLINIC DEVICE CHECK
Battery Impedance: 3536 Ohm
Brady Statistic AP VP Percent: 79 %
Brady Statistic AP VS Percent: 0 %
Brady Statistic AS VP Percent: 20 %
Brady Statistic AS VS Percent: 0 %
Date Time Interrogation Session: 20171018162314
Implantable Lead Implant Date: 20020104
Implantable Lead Location: 753859
Lead Channel Impedance Value: 397 Ohm
Lead Channel Pacing Threshold Amplitude: 0.5 V
Lead Channel Pacing Threshold Amplitude: 1.25 V
Lead Channel Sensing Intrinsic Amplitude: 4 mV
Lead Channel Setting Sensing Sensitivity: 2 mV
MDC IDC LEAD IMPLANT DT: 20020104
MDC IDC LEAD LOCATION: 753860
MDC IDC MSMT BATTERY REMAINING LONGEVITY: 10 mo
MDC IDC MSMT BATTERY VOLTAGE: 2.67 V
MDC IDC MSMT LEADCHNL RA IMPEDANCE VALUE: 377 Ohm
MDC IDC MSMT LEADCHNL RA PACING THRESHOLD PULSEWIDTH: 0.4 ms
MDC IDC MSMT LEADCHNL RA SENSING INTR AMPL: 1.4 mV
MDC IDC MSMT LEADCHNL RV PACING THRESHOLD PULSEWIDTH: 0.4 ms
MDC IDC SET LEADCHNL RA PACING AMPLITUDE: 2 V
MDC IDC SET LEADCHNL RV PACING AMPLITUDE: 3 V
MDC IDC SET LEADCHNL RV PACING PULSEWIDTH: 0.4 ms

## 2016-03-10 ENCOUNTER — Other Ambulatory Visit: Payer: Self-pay | Admitting: Internal Medicine

## 2016-04-13 ENCOUNTER — Other Ambulatory Visit: Payer: Self-pay | Admitting: Internal Medicine

## 2016-04-22 ENCOUNTER — Telehealth: Payer: Self-pay | Admitting: *Deleted

## 2016-04-22 NOTE — Telephone Encounter (Signed)
Rec'd call from [facility] stating rx was sent in for Lovastatin #30. Wanting to see if we can change quantity to 90 day. Inform Irving Burton pt is actually over due for appt once she comes in MD can rx 90 day at that time...Raechel Chute

## 2016-04-30 ENCOUNTER — Emergency Department (HOSPITAL_COMMUNITY): Payer: Medicare Other

## 2016-04-30 ENCOUNTER — Encounter (HOSPITAL_COMMUNITY): Payer: Self-pay | Admitting: Emergency Medicine

## 2016-04-30 ENCOUNTER — Inpatient Hospital Stay (HOSPITAL_COMMUNITY)
Admission: EM | Admit: 2016-04-30 | Discharge: 2016-05-07 | DRG: 177 | Disposition: A | Discharge status: Skilled Nursing Facility | Payer: Medicare Other | Attending: Internal Medicine | Admitting: Internal Medicine

## 2016-04-30 DIAGNOSIS — Z881 Allergy status to other antibiotic agents status: Secondary | ICD-10-CM

## 2016-04-30 DIAGNOSIS — R55 Syncope and collapse: Secondary | ICD-10-CM | POA: Diagnosis not present

## 2016-04-30 DIAGNOSIS — J029 Acute pharyngitis, unspecified: Secondary | ICD-10-CM | POA: Diagnosis not present

## 2016-04-30 DIAGNOSIS — J028 Acute pharyngitis due to other specified organisms: Secondary | ICD-10-CM | POA: Diagnosis not present

## 2016-04-30 DIAGNOSIS — D649 Anemia, unspecified: Secondary | ICD-10-CM

## 2016-04-30 DIAGNOSIS — R531 Weakness: Secondary | ICD-10-CM | POA: Diagnosis not present

## 2016-04-30 DIAGNOSIS — D62 Acute posthemorrhagic anemia: Secondary | ICD-10-CM

## 2016-04-30 DIAGNOSIS — Z880 Allergy status to penicillin: Secondary | ICD-10-CM

## 2016-04-30 DIAGNOSIS — B348 Other viral infections of unspecified site: Secondary | ICD-10-CM

## 2016-04-30 DIAGNOSIS — R0602 Shortness of breath: Secondary | ICD-10-CM

## 2016-04-30 DIAGNOSIS — J690 Pneumonitis due to inhalation of food and vomit: Principal | ICD-10-CM | POA: Diagnosis present

## 2016-04-30 DIAGNOSIS — R0902 Hypoxemia: Secondary | ICD-10-CM

## 2016-04-30 DIAGNOSIS — J90 Pleural effusion, not elsewhere classified: Secondary | ICD-10-CM | POA: Diagnosis not present

## 2016-04-30 DIAGNOSIS — R197 Diarrhea, unspecified: Secondary | ICD-10-CM | POA: Diagnosis not present

## 2016-04-30 DIAGNOSIS — I10 Essential (primary) hypertension: Secondary | ICD-10-CM | POA: Diagnosis present

## 2016-04-30 DIAGNOSIS — S3282XA Multiple fractures of pelvis without disruption of pelvic ring, initial encounter for closed fracture: Secondary | ICD-10-CM | POA: Diagnosis not present

## 2016-04-30 DIAGNOSIS — M069 Rheumatoid arthritis, unspecified: Secondary | ICD-10-CM | POA: Diagnosis present

## 2016-04-30 DIAGNOSIS — J9601 Acute respiratory failure with hypoxia: Secondary | ICD-10-CM | POA: Diagnosis not present

## 2016-04-30 DIAGNOSIS — S3993XA Unspecified injury of pelvis, initial encounter: Secondary | ICD-10-CM | POA: Diagnosis not present

## 2016-04-30 DIAGNOSIS — Z9889 Other specified postprocedural states: Secondary | ICD-10-CM

## 2016-04-30 DIAGNOSIS — S300XXA Contusion of lower back and pelvis, initial encounter: Secondary | ICD-10-CM | POA: Diagnosis present

## 2016-04-30 DIAGNOSIS — Z8249 Family history of ischemic heart disease and other diseases of the circulatory system: Secondary | ICD-10-CM

## 2016-04-30 DIAGNOSIS — H9190 Unspecified hearing loss, unspecified ear: Secondary | ICD-10-CM | POA: Diagnosis present

## 2016-04-30 DIAGNOSIS — S299XXA Unspecified injury of thorax, initial encounter: Secondary | ICD-10-CM | POA: Diagnosis not present

## 2016-04-30 DIAGNOSIS — R404 Transient alteration of awareness: Secondary | ICD-10-CM | POA: Diagnosis not present

## 2016-04-30 DIAGNOSIS — M810 Age-related osteoporosis without current pathological fracture: Secondary | ICD-10-CM | POA: Diagnosis present

## 2016-04-30 DIAGNOSIS — B9789 Other viral agents as the cause of diseases classified elsewhere: Secondary | ICD-10-CM

## 2016-04-30 DIAGNOSIS — W010XXA Fall on same level from slipping, tripping and stumbling without subsequent striking against object, initial encounter: Secondary | ICD-10-CM | POA: Diagnosis present

## 2016-04-30 DIAGNOSIS — Z7982 Long term (current) use of aspirin: Secondary | ICD-10-CM

## 2016-04-30 DIAGNOSIS — E785 Hyperlipidemia, unspecified: Secondary | ICD-10-CM | POA: Diagnosis present

## 2016-04-30 DIAGNOSIS — S0990XA Unspecified injury of head, initial encounter: Secondary | ICD-10-CM | POA: Diagnosis not present

## 2016-04-30 DIAGNOSIS — R710 Precipitous drop in hematocrit: Secondary | ICD-10-CM

## 2016-04-30 DIAGNOSIS — Z23 Encounter for immunization: Secondary | ICD-10-CM

## 2016-04-30 DIAGNOSIS — Z90710 Acquired absence of both cervix and uterus: Secondary | ICD-10-CM

## 2016-04-30 DIAGNOSIS — Z950 Presence of cardiac pacemaker: Secondary | ICD-10-CM

## 2016-04-30 DIAGNOSIS — Y92003 Bedroom of unspecified non-institutional (private) residence as the place of occurrence of the external cause: Secondary | ICD-10-CM

## 2016-04-30 DIAGNOSIS — E876 Hypokalemia: Secondary | ICD-10-CM | POA: Diagnosis present

## 2016-04-30 DIAGNOSIS — Z79899 Other long term (current) drug therapy: Secondary | ICD-10-CM

## 2016-04-30 DIAGNOSIS — K529 Noninfective gastroenteritis and colitis, unspecified: Secondary | ICD-10-CM | POA: Diagnosis present

## 2016-04-30 HISTORY — DX: Spinal stenosis, cervical region: M48.02

## 2016-04-30 LAB — CBC WITH DIFFERENTIAL/PLATELET
BASOS ABS: 0 10*3/uL (ref 0.0–0.1)
Basophils Relative: 0 %
Eosinophils Absolute: 0 10*3/uL (ref 0.0–0.7)
Eosinophils Relative: 0 %
HEMATOCRIT: 41.1 % (ref 36.0–46.0)
HEMOGLOBIN: 13.5 g/dL (ref 12.0–15.0)
LYMPHS PCT: 17 %
Lymphs Abs: 1.1 10*3/uL (ref 0.7–4.0)
MCH: 32.1 pg (ref 26.0–34.0)
MCHC: 32.8 g/dL (ref 30.0–36.0)
MCV: 97.9 fL (ref 78.0–100.0)
Monocytes Absolute: 0.5 10*3/uL (ref 0.1–1.0)
Monocytes Relative: 8 %
NEUTROS ABS: 4.8 10*3/uL (ref 1.7–7.7)
Neutrophils Relative %: 75 %
Platelets: 147 10*3/uL — ABNORMAL LOW (ref 150–400)
RBC: 4.2 MIL/uL (ref 3.87–5.11)
RDW: 14.7 % (ref 11.5–15.5)
WBC: 6.4 10*3/uL (ref 4.0–10.5)

## 2016-04-30 LAB — URINALYSIS, ROUTINE W REFLEX MICROSCOPIC
Bilirubin Urine: NEGATIVE
Glucose, UA: NEGATIVE mg/dL
Hgb urine dipstick: NEGATIVE
KETONES UR: 5 mg/dL — AB
Leukocytes, UA: NEGATIVE
Nitrite: NEGATIVE
PH: 8 (ref 5.0–8.0)
Protein, ur: 30 mg/dL — AB
Specific Gravity, Urine: 1.013 (ref 1.005–1.030)

## 2016-04-30 LAB — COMPREHENSIVE METABOLIC PANEL
ALK PHOS: 54 U/L (ref 38–126)
ALT: 23 U/L (ref 14–54)
AST: 39 U/L (ref 15–41)
Albumin: 3.6 g/dL (ref 3.5–5.0)
Anion gap: 8 (ref 5–15)
BILIRUBIN TOTAL: 0.8 mg/dL (ref 0.3–1.2)
BUN: 12 mg/dL (ref 6–20)
CHLORIDE: 103 mmol/L (ref 101–111)
CO2: 25 mmol/L (ref 22–32)
CREATININE: 0.81 mg/dL (ref 0.44–1.00)
Calcium: 9 mg/dL (ref 8.9–10.3)
GFR calc Af Amer: >60 mL/min (ref >60)
Glucose, Bld: 113 mg/dL — ABNORMAL HIGH (ref 65–99)
Potassium: 3.9 mmol/L (ref 3.5–5.1)
Sodium: 136 mmol/L (ref 135–145)
TOTAL PROTEIN: 6.3 g/dL — AB (ref 6.5–8.1)

## 2016-04-30 MED ORDER — SODIUM CHLORIDE 0.9 % IV BOLUS (SEPSIS)
500.0000 mL | Freq: Once | INTRAVENOUS | Status: AC
  Administered 2016-04-30: 500 mL via INTRAVENOUS

## 2016-04-30 MED ORDER — CHLORHEXIDINE GLUCONATE 0.12 % MT SOLN
15.0000 mL | Freq: Once | OROMUCOSAL | Status: AC
  Administered 2016-04-30: 15 mL via OROMUCOSAL
  Filled 2016-04-30: qty 15

## 2016-04-30 NOTE — ED Notes (Signed)
ED Provider at bedside. 

## 2016-04-30 NOTE — ED Provider Notes (Signed)
[facility]-EMERGENCY DEPT Provider Note   CSN: [id number] Arrival date & time: 04/30/16  1904     History   Chief Complaint Chief Complaint  Patient presents with  . Fall  . generalized weakness    HPI Alexis Cobb is a 82 y.o. female.  The history is provided by a relative and the patient.  Weakness  Primary symptoms include no focal weakness. Primary symptoms comment: fell out of bed today. This is a new problem. The current episode started 12 to 24 hours ago. The problem has not changed since onset.There was no focality noted. There has been no fever. Associated symptoms comments: Difficulty swallowing, sore throat. There were no medications administered prior to arrival. Associated medical issues include trauma (fell from bed today). Associated medical issues do not include dementia or CVA.    Past Medical History:  Diagnosis Date  . Arthritis   . ARTHRITIS, RHEUMATOID, HX OF 01/06/2007  . HTN (hypertension)   . Hyperlipidemia   . MITRAL REGURGITATION 01/06/2007  . OSTEOARTHRITIS 01/14/2007  . OSTEOPOROSIS 01/14/2007  . Other specified forms of hearing loss 08/01/2009  . PACEMAKER, PERMANENT 01/06/2007  . Raynaud's syndrome 01/06/2007    Patient Active Problem List   Diagnosis Date Noted  . Rash 05/31/2015  . Hives 12/10/2014  . Chronic venous insufficiency 12/10/2014  . Peripheral neuropathy (HCC) 12/12/2013  . Right shoulder pain 07/31/2012  . Anxiety state 07/31/2012  . Sinoatrial node dysfunction (HCC) 11/23/2011  . Polyarthropathy 12/31/2010  . Benign head tremor 12/31/2010  . Cervical radicular pain 12/31/2010  . Fatigue 12/31/2010  . Preventative health care 11/27/2010  . OSTEOARTHRITIS 01/14/2007  . OSTEOPOROSIS 01/14/2007  . Hyperlipidemia 01/06/2007  . MITRAL REGURGITATION 01/06/2007  . Essential hypertension 01/06/2007  . Raynaud's syndrome 01/06/2007  . PACEMAKER, PERMANENT 01/06/2007    Past Surgical History:  Procedure Laterality Date  .  MITRAL VALVE REPAIR      OB History    No data available       Home Medications    Prior to Admission medications   Medication Sig Start Date End Date Taking? Authorizing Provider  aspirin EC 81 MG tablet Take 81 mg by mouth at bedtime.   Yes Historical Provider, MD  Calcium Carbonate-Vitamin D (CALCIUM 600+D) 600-400 MG-UNIT tablet Take 1 tablet by mouth 2 (two) times daily.   Yes Historical Provider, MD  cholecalciferol (VITAMIN D) 1000 units tablet Take 1,000 Units by mouth daily.   Yes Historical Provider, MD  furosemide (LASIX) 20 MG tablet Take 20 mg by mouth daily as needed for edema.   Yes Historical Provider, MD  losartan (COZAAR) 100 MG tablet Take 100 mg by mouth daily.   Yes Historical Provider, MD  lovastatin (MEVACOR) 40 MG tablet Take 40 mg by mouth at bedtime.   Yes Historical Provider, MD  metoprolol succinate (TOPROL-XL) 50 MG 24 hr tablet Take 75 mg by mouth daily. Take with or immediately following a meal.   Yes Historical Provider, MD  Multiple Vitamin (MULTIVITAMIN WITH MINERALS) TABS tablet Take 1 tablet by mouth daily.   Yes Historical Provider, MD  nitroGLYCERIN (NITROSTAT) 0.4 MG SL tablet Place 0.4 mg under the tongue every 5 (five) minutes as needed for chest pain.   Yes Historical Provider, MD    Family History Family History  Problem Relation Age of Onset  . Coronary artery disease    . Hypertension      Social History Social History  Substance Use Topics  . Smoking  status: Never Smoker  . Smokeless tobacco: Never Used  . Alcohol use No     Allergies   Ace inhibitors; Cephalexin; Penicillins; and Risedronate sodium   Review of Systems Review of Systems  Neurological: Positive for weakness. Negative for focal weakness.  All other systems reviewed and are negative.    Physical Exam Updated Vital Signs BP 130/84 (BP Location: Right Arm)   Pulse 76   Temp 97.5 F (36.4 C) (Axillary)   Resp 19   SpO2 97%   Physical Exam    Constitutional: She is oriented to person, place, and time. She appears lethargic. She appears ill. No distress.  HENT:  Head: Normocephalic.  Nose: Nose normal.  Eyes: Conjunctivae are normal.  Neck: Neck supple. No tracheal deviation present.  Cardiovascular: Normal rate, regular rhythm and normal heart sounds.   Pulmonary/Chest: Effort normal. No respiratory distress. She has wheezes (faint bilateral).  Abdominal: Soft. She exhibits no distension.  Neurological: She is oriented to person, place, and time. She appears lethargic.  Skin: Skin is warm and dry. There is pallor.  Psychiatric: She has a normal mood and affect. Her speech is slurred. She is slowed.     ED Treatments / Results  Labs (all labs ordered are listed, but only abnormal results are displayed) Labs Reviewed  CBC WITH DIFFERENTIAL/PLATELET - Abnormal; Notable for the following:       Result Value   Platelets 147 (*)    All other components within normal limits  COMPREHENSIVE METABOLIC PANEL - Abnormal; Notable for the following:    Glucose, Bld 113 (*)    Total Protein 6.3 (*)    All other components within normal limits  URINALYSIS, ROUTINE W REFLEX MICROSCOPIC - Abnormal; Notable for the following:    APPearance HAZY (*)    Ketones, ur 5 (*)    Protein, ur 30 (*)    Bacteria, UA RARE (*)    Squamous Epithelial / LPF 0-5 (*)    All other components within normal limits  RAPID STREP SCREEN (NOT AT [facility])  CULTURE, BLOOD (ROUTINE X 2)  CULTURE, BLOOD (ROUTINE X 2)  CULTURE, EXPECTORATED SPUTUM-ASSESSMENT  GRAM STAIN  C DIFFICILE QUICK SCREEN W PCR REFLEX  CULTURE, GROUP A STREP [facility])  BASIC METABOLIC PANEL  CBC  I-STAT TROPOININ, ED    EKG  EKG Interpretation  Date/Time:  Friday April 30 2016 23:53:22 EST Ventricular Rate:  72 PR Interval:    QRS Duration: 130 QT Interval:  440 QTC Calculation: 482 R Axis:   -70 Text Interpretation:  Atrial-sensed ventricular-paced rhythm No further  analysis attempted due to paced rhythm No significant change since last tracing Confirmed by Ruthvik Barnaby MD, Melisha Eggleton [phone]) on 05/01/2016 12:02:26 AM       Radiology Dg Chest 2 View  Result Date: 04/30/2016 CLINICAL DATA:  82 year old female with fall EXAM: CHEST  2 VIEW COMPARISON:  Chest radiograph dated 09/05/2008 FINDINGS: There is emphysematous changes of the lungs. No focal consolidation, pleural effusion, or pneumothorax. The cardiac silhouette is within normal limits. The aorta is mildly tortuous. Median sternotomy wires and left pectoral dual lead pacemaker device noted. There is osteopenia with degenerative changes of the spine. No acute fracture. IMPRESSION: No acute cardiopulmonary process. Emphysema. Electronically Signed   By: Elgie Collard M.D.   On: 04/30/2016 23:43   Dg Pelvis 1-2 Views  Result Date: 04/30/2016 CLINICAL DATA:  Larey Seat at home at 17:00 today. Generalized weakness for several days. EXAM: PELVIS - 1-2 VIEW  COMPARISON:  None. FINDINGS: There is no evidence of pelvic fracture or diastasis. No pelvic bone lesions are seen. IMPRESSION: Negative. Electronically Signed   By: Ellery Plunk M.D.   On: 04/30/2016 23:43   Ct Head Wo Contrast  Result Date: 04/30/2016 CLINICAL DATA:  Larey Seat at 17:00 today. Generalized weakness for several days. EXAM: CT HEAD WITHOUT CONTRAST TECHNIQUE: Contiguous axial images were obtained from the base of the skull through the vertex without intravenous contrast. COMPARISON:  None. FINDINGS: Brain: There is no intracranial hemorrhage, mass or evidence of acute infarction. There is moderate generalized atrophy. There is moderate chronic microvascular ischemic change. There is no significant extra-axial fluid collection. No acute intracranial findings are evident. Vascular: No hyperdense vessel or unexpected calcification. Skull: Normal. Negative for fracture or focal lesion. Sinuses/Orbits: No acute finding. Other: None. IMPRESSION: No acute  intracranial findings. There is moderate generalized atrophy and chronic appearing white matter hypodensities which likely represent small vessel ischemic disease. Electronically Signed   By: Ellery Plunk M.D.   On: 04/30/2016 23:29    Procedures Procedures (including critical care time)  Medications Ordered in ED Medications  Influenza vac split quadrivalent PF (FLUARIX) injection 0.5 mL (not administered)  aspirin EC tablet 81 mg (81 mg Oral Given 05/01/16 0148)  pravastatin (PRAVACHOL) tablet 40 mg (not administered)  metoprolol succinate (TOPROL-XL) 24 hr tablet 75 mg (not administered)  losartan (COZAAR) tablet 100 mg (not administered)  enoxaparin (LOVENOX) injection 40 mg (40 mg Subcutaneous Not Given 05/01/16 0139)  acetaminophen (TYLENOL) tablet 650 mg (650 mg Oral Given 05/01/16 0148)    Or  acetaminophen (TYLENOL) suppository 650 mg ( Rectal See Alternative 05/01/16 0148)  ondansetron (ZOFRAN) tablet 4 mg (not administered)    Or  ondansetron (ZOFRAN) injection 4 mg (not administered)  levofloxacin (LEVAQUIN) IVPB 750 mg (750 mg Intravenous Given 05/01/16 0148)  lactated ringers infusion ( Intravenous New Bag/Given 05/01/16 0146)  magic mouthwash w/lidocaine (not administered)  chlorhexidine (PERIDEX) 0.12 % solution 15 mL (15 mLs Mouth/Throat Given 04/30/16 2341)  sodium chloride 0.9 % bolus 500 mL (0 mLs Intravenous Stopped 05/01/16 0045)     Initial Impression / Assessment and Plan / ED Course  I have reviewed the triage vital signs and the nursing notes.  Pertinent labs & imaging results that were available during my care of the patient were reviewed by me and considered in my medical decision making (see chart for details).  Clinical Course     82 y.o. female presents with weakness and falling onto floor from bed today. She had a sore throat starting yesterday. On arrival she appears very weak and deconditioned. Has difficulty with swallowing. No CVA noted on  CT, No significant hematologic or metabolic abnormalities to explain symptoms. No signs of UTI or other comorbid factors. Has new O2 requirement dropping to 86 on room air, suspect aspiration with clinical appearance. Hospitalist was consulted for admission and will see the patient in the emergency department.   Final Clinical Impressions(s) / ED Diagnoses   Final diagnoses:  Hypoxemia  Weakness  Sore throat  Aspiration pneumonia, unspecified aspiration pneumonia type, unspecified laterality, unspecified part of lung [facility])    New Prescriptions New Prescriptions   No medications on file     Lyndal Pulley, MD 05/01/16 [phone]

## 2016-04-30 NOTE — ED Notes (Signed)
Patient transported to CT 

## 2016-04-30 NOTE — ED Triage Notes (Signed)
Per EMS , pt. From home with complaint of fall at 5pm this today, denied LOC.pt. Stated that she felt generalized weakness for the past few days now , complaint of bil. Hip pain at 8/10. No report of obvious deformity. Pt. Lives alone ,alert and oriented x3. With soft c-collar intact. O2

## 2016-04-30 NOTE — ED Notes (Signed)
Bed: [facility] Expected date: 04/30/16 Expected time: 6:58 PM Means of arrival: Ambulance Comments: Weakness Hold 10

## 2016-04-30 NOTE — ED Notes (Signed)
Pt is aware urine is needed 

## 2016-04-30 NOTE — H&P (Signed)
History and Physical    [name] XFG:[id number] DOB: [birth year] DOA: 04/30/2016  PCP: Oliver Barre, MD Consultants:  Jens Som - cardiology; Ladona Ridgel - EP Patient coming from: home - lives alone; NOK: niece, Jaiyla Granados  Chief Complaint: fall  HPI: Alexis Cobb is a 82 y.o. female with medical history significant of mitral valve repair, pacemaker placement, HTN, and HLD presenting after she slipped and fell in the bedroom.  She niece (an Charity fundraiser) had called her about 115 today.  She didn't want to go see doctor despite sounding horrible.  The patient had a scratchy throat Wednesday, but the niece didn't talk to her yesterday.  The patient usually gets hair done Friday, but didn't feel well enough today to get hair done.    The niece called her sister (a speech therapist), who went to see her; the patient wouldn't answer the door.  The patient reports that she slipped and fell; she pressed her life alert - the company notified the RN niece's husband and he called her.  She arrived at the same time as the ambulance.  The patient was between 2 twin beds, had on socks but no slippers on hardwood floors.  Unable to get up. The niece last spoke with her at 7, arrived at 76.   Family reports that the patient is very pale.  She has had diarrhea for the last 2 days, had stool incontinence when they found her and she has had 3 watery stools while in the ER.  The patient states that she has never had such a severe sore throat before.  No headache.  No cough, no fever.  Has not been feeling SOB.   ED Course: given chlorhexidine, NS bolus  Review of Systems: As per HPI; otherwise 10 point review of systems reviewed and negative.   Ambulatory Status:  Ambulates with a cane  Past Medical History:  Diagnosis Date  . Arthritis   . ARTHRITIS, RHEUMATOID, HX OF 01/06/2007  . Cervical stenosis of spine   . HTN (hypertension)   . Hyperlipidemia   . MITRAL REGURGITATION 01/06/2007  .  OSTEOARTHRITIS 01/14/2007  . OSTEOPOROSIS 01/14/2007  . Other specified forms of hearing loss 08/01/2009  . PACEMAKER, PERMANENT 01/06/2007  . Raynaud's syndrome 01/06/2007    Past Surgical History:  Procedure Laterality Date  . ABDOMINAL HYSTERECTOMY    . APPENDECTOMY    . CATARACT EXTRACTION, BILATERAL    . MITRAL VALVE REPAIR    . ovarian tumor, benign    . PACEMAKER INSERTION      Social History   Social History  . Marital status: Widowed    Spouse name: N/A  . Number of children: N/A  . Years of education: N/A   Occupational History  . retired Catering manager Retired   Social History Main Topics  . Smoking status: Never Smoker  . Smokeless tobacco: Never Used  . Alcohol use No  . Drug use: No  . Sexual activity: Not on file   Other Topics Concern  . Not on file   Social History Narrative  . No narrative on file    Allergies  Allergen Reactions  . Ace Inhibitors Rash  . Cephalexin Rash  . Penicillins Rash and Other (See Comments)    Has patient had a PCN reaction causing immediate rash, facial/tongue/throat swelling, SOB or lightheadedness with hypotension: No Has patient had a PCN reaction causing severe rash involving mucus membranes or skin necrosis: No Has patient had a PCN reaction  that required hospitalization No Has patient had a PCN reaction occurring within the last 10 years: No If all of the above answers are "NO", then may proceed with Cephalosporin use.  Marland Kitchen Risedronate Sodium Rash    Family History  Problem Relation Age of Onset  . Coronary artery disease    . Hypertension      Prior to Admission medications   Medication Sig Start Date End Date Taking? Authorizing Provider  aspirin EC 81 MG tablet Take 81 mg by mouth at bedtime.   Yes Historical Provider, MD  Calcium Carbonate-Vitamin D (CALCIUM 600+D) 600-400 MG-UNIT tablet Take 1 tablet by mouth 2 (two) times daily.   Yes Historical Provider, MD  cholecalciferol (VITAMIN D) 1000 units tablet Take  1,000 Units by mouth daily.   Yes Historical Provider, MD  furosemide (LASIX) 20 MG tablet Take 20 mg by mouth daily as needed for edema.   Yes Historical Provider, MD  losartan (COZAAR) 100 MG tablet Take 100 mg by mouth daily.   Yes Historical Provider, MD  lovastatin (MEVACOR) 40 MG tablet Take 40 mg by mouth at bedtime.   Yes Historical Provider, MD  metoprolol succinate (TOPROL-XL) 50 MG 24 hr tablet Take 75 mg by mouth daily. Take with or immediately following a meal.   Yes Historical Provider, MD  Multiple Vitamin (MULTIVITAMIN WITH MINERALS) TABS tablet Take 1 tablet by mouth daily.   Yes Historical Provider, MD  nitroGLYCERIN (NITROSTAT) 0.4 MG SL tablet Place 0.4 mg under the tongue every 5 (five) minutes as needed for chest pain.   Yes Historical Provider, MD    Physical Exam: Vitals:   05/01/16 0000 05/01/16 0030 05/01/16 0055 05/01/16 0122  BP: 169/89 158/75  (!) 155/72  Pulse: 72 67  76  Resp: (!) 27 21  (!) 22  Temp:    99.8 F (37.7 C)  TempSrc:    Axillary  SpO2: 100% 99%  100%  Weight:   46.7 kg (103 lb) 46.7 kg (103 lb)  Height:   5\' 2"  (1.575 m) 5\' 3"  (1.6 m)     General: Appears very pale and very frail (apparently this is very out of character for her, but she looks quite ill) Eyes:  PERRL, EOMI, normal lids, iris ENT:  grossly normal hearing, lips & tongue, dry mm; some throat erythema but difficult to visualize due to patient's inability to open her mouth wide (has TMJ) Neck:  no LAD, masses or thyromegaly Cardiovascular:  RRR, 2-3/6 systolic murmur, no r/g. No LE edema.  Respiratory:  Coarse ronchi, no w/r/c. Normal respiratory effort.  On [place] O2. Abdomen:  soft, ntnd, NABS Skin: no rash or induration seen on limited exam Musculoskeletal:  grossly normal tone BUE/BLE, good ROM, no bony abnormality Psychiatric:  grossly normal mood and affect, speech fluent and appropriate, AOx3 Neurologic:  CN 2-12 grossly intact, moves all extremities in coordinated fashion,  sensation intact  Labs on Admission: I have personally reviewed following labs and imaging studies  CBC:  Recent Labs Lab 04/30/16 2027  WBC 6.4  NEUTROABS 4.8  HGB 13.5  HCT 41.1  MCV 97.9  PLT 147*   Basic Metabolic Panel:  Recent Labs Lab 04/30/16 2027  NA 136  K 3.9  CL 103  CO2 25  GLUCOSE 113*  BUN 12  CREATININE 0.81  CALCIUM 9.0   GFR: Estimated Creatinine Clearance: 34.7 mL/min (by C-G formula based on SCr of 0.81 mg/dL). Liver Function Tests:  Recent Labs Lab 04/30/16  2027  AST 39  ALT 23  ALKPHOS 54  BILITOT 0.8  PROT 6.3*  ALBUMIN 3.6   No results for input(s): LIPASE, AMYLASE in the last 168 hours. No results for input(s): AMMONIA in the last 168 hours. Coagulation Profile: No results for input(s): INR, PROTIME in the last 168 hours. Cardiac Enzymes: No results for input(s): CKTOTAL, CKMB, CKMBINDEX, TROPONINI in the last 168 hours. BNP (last 3 results) No results for input(s): PROBNP in the last 8760 hours. HbA1C: No results for input(s): HGBA1C in the last 72 hours. CBG: No results for input(s): GLUCAP in the last 168 hours. Lipid Profile: No results for input(s): CHOL, HDL, LDLCALC, TRIG, CHOLHDL, LDLDIRECT in the last 72 hours. Thyroid Function Tests: No results for input(s): TSH, T4TOTAL, FREET4, T3FREE, THYROIDAB in the last 72 hours. Anemia Panel: No results for input(s): VITAMINB12, FOLATE, FERRITIN, TIBC, IRON, RETICCTPCT in the last 72 hours. Urine analysis:    Component Value Date/Time   COLORURINE YELLOW 04/30/2016 2338   APPEARANCEUR HAZY (A) 04/30/2016 2338   LABSPEC 1.013 04/30/2016 2338   PHURINE 8.0 04/30/2016 2338   GLUCOSEU NEGATIVE 04/30/2016 2338   GLUCOSEU NEGATIVE 12/12/2013 1230   HGBUR NEGATIVE 04/30/2016 2338   BILIRUBINUR NEGATIVE 04/30/2016 2338   KETONESUR 5 (A) 04/30/2016 2338   PROTEINUR 30 (A) 04/30/2016 2338   UROBILINOGEN 0.2 12/12/2013 1230   NITRITE NEGATIVE 04/30/2016 2338   LEUKOCYTESUR  NEGATIVE 04/30/2016 2338    Creatinine Clearance: Estimated Creatinine Clearance: 34.7 mL/min (by C-G formula based on SCr of 0.81 mg/dL).  Sepsis Labs: @LABRCNTIP (procalcitonin:4,lacticidven:4) )No results found for this or any previous visit (from the past 240 hour(s)).   Radiological Exams on Admission: Dg Chest 2 View  Result Date: 04/30/2016 CLINICAL DATA:  82 year old female with fall EXAM: CHEST  2 VIEW COMPARISON:  Chest radiograph dated 09/05/2008 FINDINGS: There is emphysematous changes of the lungs. No focal consolidation, pleural effusion, or pneumothorax. The cardiac silhouette is within normal limits. The aorta is mildly tortuous. Median sternotomy wires and left pectoral dual lead pacemaker device noted. There is osteopenia with degenerative changes of the spine. No acute fracture. IMPRESSION: No acute cardiopulmonary process. Emphysema. Electronically Signed   By: Elgie Collard M.D.   On: 04/30/2016 23:43   Dg Pelvis 1-2 Views  Result Date: 04/30/2016 CLINICAL DATA:  Larey Seat at home at 17:00 today. Generalized weakness for several days. EXAM: PELVIS - 1-2 VIEW COMPARISON:  None. FINDINGS: There is no evidence of pelvic fracture or diastasis. No pelvic bone lesions are seen. IMPRESSION: Negative. Electronically Signed   By: Ellery Plunk M.D.   On: 04/30/2016 23:43   Ct Head Wo Contrast  Result Date: 04/30/2016 CLINICAL DATA:  Larey Seat at 17:00 today. Generalized weakness for several days. EXAM: CT HEAD WITHOUT CONTRAST TECHNIQUE: Contiguous axial images were obtained from the base of the skull through the vertex without intravenous contrast. COMPARISON:  None. FINDINGS: Brain: There is no intracranial hemorrhage, mass or evidence of acute infarction. There is moderate generalized atrophy. There is moderate chronic microvascular ischemic change. There is no significant extra-axial fluid collection. No acute intracranial findings are evident. Vascular: No hyperdense vessel or  unexpected calcification. Skull: Normal. Negative for fracture or focal lesion. Sinuses/Orbits: No acute finding. Other: None. IMPRESSION: No acute intracranial findings. There is moderate generalized atrophy and chronic appearing white matter hypodensities which likely represent small vessel ischemic disease. Electronically Signed   By: Ellery Plunk M.D.   On: 04/30/2016 23:29    EKG: Independently  reviewed.  NSR with rate 72; atrial-sensed ventricular rhythm, no further analysis at this time  Assessment/Plan Principal Problem:   Acute respiratory failure with hypoxia (HCC) Active Problems:   Essential hypertension   Acute pharyngitis   Diarrhea   -Patient who is generally quite healthy (still drives, lives independently, etc) despite age of 82yo presenting with acute onset of debilitating illness -Has respiratory failure with hypoxia without apparent indication.  The concern is that this is an early/developing aspiration PNA, particularly in light of the severe sore throat. -Will keep patient NPO for now pending speech therapy swallow evaluation. -Empiric abx coverage with Levaquin (patient has a PCN/cephalosporin allergy). -Will check rapid strep test. -Magic mouthwash with lidocaine for swish and spit. -She also has diarrhea.  No recent h/o abx but will test for C. Diff. -This may all be attributable to a viral syndrome.  However, given her age alone her prognosis is guarded with an estimated APACHE II score of 12 and a predicted death rate of 14.6%. -The ER doctor reports that he ordered a troponin, but the result is not available in Epic at this time; she denies chest pain. -Unfortunately, despite her advanced age, she has not considered advanced directives at all.  Her designated NOK is a different niece from the 2 who are here tonight and the patient reports that she has not had time to notify her of this status - despite her being the POA since 37.  Ongoing family discussed has  been encouraged.  HTN -Continue Toprol XL and Cozaar for now.  DVT prophylaxis: Lovenox Code Status: Full - patient/family will discuss further Family Communication: Nieces present throughout evaluation Disposition Plan:  Home once clinically improved Consults called: None  Admission status: Admit - It is my clinical opinion that admission to INPATIENT is reasonable and necessary because this patient will require at least 2 midnights in the hospital to treat this condition based on the medical complexity of the problems presented.  Given the aforementioned information, the predictability of an adverse outcome is felt to be significant.     Jonah Blue MD [facility]  If 7PM-7AM, please contact night-coverage [email or web address] Password [email or web address]  05/01/2016, 1:38 AM

## 2016-05-01 ENCOUNTER — Encounter (HOSPITAL_COMMUNITY): Payer: Self-pay | Admitting: Internal Medicine

## 2016-05-01 ENCOUNTER — Inpatient Hospital Stay (HOSPITAL_COMMUNITY): Payer: Medicare Other

## 2016-05-01 DIAGNOSIS — J028 Acute pharyngitis due to other specified organisms: Secondary | ICD-10-CM | POA: Diagnosis not present

## 2016-05-01 DIAGNOSIS — S3993XA Unspecified injury of pelvis, initial encounter: Secondary | ICD-10-CM | POA: Diagnosis present

## 2016-05-01 DIAGNOSIS — J948 Other specified pleural conditions: Secondary | ICD-10-CM | POA: Diagnosis not present

## 2016-05-01 DIAGNOSIS — B348 Other viral infections of unspecified site: Secondary | ICD-10-CM | POA: Diagnosis not present

## 2016-05-01 DIAGNOSIS — Z90710 Acquired absence of both cervix and uterus: Secondary | ICD-10-CM | POA: Diagnosis not present

## 2016-05-01 DIAGNOSIS — R1312 Dysphagia, oropharyngeal phase: Secondary | ICD-10-CM | POA: Diagnosis not present

## 2016-05-01 DIAGNOSIS — M069 Rheumatoid arthritis, unspecified: Secondary | ICD-10-CM | POA: Diagnosis present

## 2016-05-01 DIAGNOSIS — E785 Hyperlipidemia, unspecified: Secondary | ICD-10-CM | POA: Diagnosis present

## 2016-05-01 DIAGNOSIS — M6281 Muscle weakness (generalized): Secondary | ICD-10-CM | POA: Diagnosis not present

## 2016-05-01 DIAGNOSIS — Z9181 History of falling: Secondary | ICD-10-CM | POA: Diagnosis not present

## 2016-05-01 DIAGNOSIS — Z79899 Other long term (current) drug therapy: Secondary | ICD-10-CM | POA: Diagnosis not present

## 2016-05-01 DIAGNOSIS — Y92003 Bedroom of unspecified non-institutional (private) residence as the place of occurrence of the external cause: Secondary | ICD-10-CM | POA: Diagnosis not present

## 2016-05-01 DIAGNOSIS — S3991XA Unspecified injury of abdomen, initial encounter: Secondary | ICD-10-CM | POA: Diagnosis not present

## 2016-05-01 DIAGNOSIS — M4802 Spinal stenosis, cervical region: Secondary | ICD-10-CM | POA: Diagnosis not present

## 2016-05-01 DIAGNOSIS — J9601 Acute respiratory failure with hypoxia: Secondary | ICD-10-CM | POA: Diagnosis not present

## 2016-05-01 DIAGNOSIS — H9190 Unspecified hearing loss, unspecified ear: Secondary | ICD-10-CM | POA: Diagnosis present

## 2016-05-01 DIAGNOSIS — J029 Acute pharyngitis, unspecified: Secondary | ICD-10-CM | POA: Diagnosis not present

## 2016-05-01 DIAGNOSIS — Z880 Allergy status to penicillin: Secondary | ICD-10-CM | POA: Diagnosis not present

## 2016-05-01 DIAGNOSIS — K529 Noninfective gastroenteritis and colitis, unspecified: Secondary | ICD-10-CM | POA: Diagnosis present

## 2016-05-01 DIAGNOSIS — Z8249 Family history of ischemic heart disease and other diseases of the circulatory system: Secondary | ICD-10-CM | POA: Diagnosis not present

## 2016-05-01 DIAGNOSIS — R0902 Hypoxemia: Secondary | ICD-10-CM | POA: Diagnosis not present

## 2016-05-01 DIAGNOSIS — R933 Abnormal findings on diagnostic imaging of other parts of digestive tract: Secondary | ICD-10-CM | POA: Diagnosis not present

## 2016-05-01 DIAGNOSIS — J690 Pneumonitis due to inhalation of food and vomit: Secondary | ICD-10-CM | POA: Diagnosis not present

## 2016-05-01 DIAGNOSIS — S300XXA Contusion of lower back and pelvis, initial encounter: Secondary | ICD-10-CM | POA: Diagnosis present

## 2016-05-01 DIAGNOSIS — R197 Diarrhea, unspecified: Secondary | ICD-10-CM | POA: Diagnosis not present

## 2016-05-01 DIAGNOSIS — R0609 Other forms of dyspnea: Secondary | ICD-10-CM | POA: Diagnosis not present

## 2016-05-01 DIAGNOSIS — D62 Acute posthemorrhagic anemia: Secondary | ICD-10-CM | POA: Diagnosis not present

## 2016-05-01 DIAGNOSIS — R259 Unspecified abnormal involuntary movements: Secondary | ICD-10-CM | POA: Diagnosis not present

## 2016-05-01 DIAGNOSIS — Z881 Allergy status to other antibiotic agents status: Secondary | ICD-10-CM | POA: Diagnosis not present

## 2016-05-01 DIAGNOSIS — W010XXA Fall on same level from slipping, tripping and stumbling without subsequent striking against object, initial encounter: Secondary | ICD-10-CM | POA: Diagnosis present

## 2016-05-01 DIAGNOSIS — R918 Other nonspecific abnormal finding of lung field: Secondary | ICD-10-CM | POA: Diagnosis not present

## 2016-05-01 DIAGNOSIS — Z950 Presence of cardiac pacemaker: Secondary | ICD-10-CM | POA: Diagnosis not present

## 2016-05-01 DIAGNOSIS — R2689 Other abnormalities of gait and mobility: Secondary | ICD-10-CM | POA: Diagnosis not present

## 2016-05-01 DIAGNOSIS — R2681 Unsteadiness on feet: Secondary | ICD-10-CM | POA: Diagnosis not present

## 2016-05-01 DIAGNOSIS — R279 Unspecified lack of coordination: Secondary | ICD-10-CM | POA: Diagnosis not present

## 2016-05-01 DIAGNOSIS — Z7982 Long term (current) use of aspirin: Secondary | ICD-10-CM | POA: Diagnosis not present

## 2016-05-01 DIAGNOSIS — E876 Hypokalemia: Secondary | ICD-10-CM | POA: Diagnosis present

## 2016-05-01 DIAGNOSIS — J90 Pleural effusion, not elsewhere classified: Secondary | ICD-10-CM | POA: Diagnosis not present

## 2016-05-01 DIAGNOSIS — Z23 Encounter for immunization: Secondary | ICD-10-CM | POA: Diagnosis not present

## 2016-05-01 DIAGNOSIS — I10 Essential (primary) hypertension: Secondary | ICD-10-CM | POA: Diagnosis not present

## 2016-05-01 DIAGNOSIS — M810 Age-related osteoporosis without current pathological fracture: Secondary | ICD-10-CM | POA: Diagnosis present

## 2016-05-01 DIAGNOSIS — S3282XA Multiple fractures of pelvis without disruption of pelvic ring, initial encounter for closed fracture: Secondary | ICD-10-CM | POA: Diagnosis present

## 2016-05-01 LAB — BASIC METABOLIC PANEL
ANION GAP: 9 (ref 5–15)
BUN: 13 mg/dL (ref 6–20)
CHLORIDE: 102 mmol/L (ref 101–111)
CO2: 24 mmol/L (ref 22–32)
Calcium: 8.4 mg/dL — ABNORMAL LOW (ref 8.9–10.3)
Creatinine, Ser: 0.77 mg/dL (ref 0.44–1.00)
GFR calc non Af Amer: >60 mL/min (ref >60)
Glucose, Bld: 158 mg/dL — ABNORMAL HIGH (ref 65–99)
Potassium: 3.5 mmol/L (ref 3.5–5.1)
Sodium: 135 mmol/L (ref 135–145)

## 2016-05-01 LAB — CBC
HCT: 39.2 % (ref 36.0–46.0)
HEMOGLOBIN: 13 g/dL (ref 12.0–15.0)
MCH: 32.3 pg (ref 26.0–34.0)
MCHC: 33.2 g/dL (ref 30.0–36.0)
MCV: 97.3 fL (ref 78.0–100.0)
Platelets: 126 10*3/uL — ABNORMAL LOW (ref 150–400)
RBC: 4.03 MIL/uL (ref 3.87–5.11)
RDW: 14.7 % (ref 11.5–15.5)
WBC: 7.8 10*3/uL (ref 4.0–10.5)

## 2016-05-01 LAB — RAPID STREP SCREEN (MED CTR MEBANE ONLY): STREPTOCOCCUS, GROUP A SCREEN (DIRECT): NEGATIVE

## 2016-05-01 MED ORDER — MAGIC MOUTHWASH W/LIDOCAINE
5.0000 mL | Freq: Three times a day (TID) | ORAL | Status: DC | PRN
  Filled 2016-05-01: qty 5

## 2016-05-01 MED ORDER — INFLUENZA VAC SPLIT QUAD 0.5 ML IM SUSY
0.5000 mL | PREFILLED_SYRINGE | INTRAMUSCULAR | Status: AC
  Administered 2016-05-03: 0.5 mL via INTRAMUSCULAR
  Filled 2016-05-01: qty 0.5

## 2016-05-01 MED ORDER — ACETAMINOPHEN 325 MG PO TABS
650.0000 mg | ORAL_TABLET | Freq: Four times a day (QID) | ORAL | Status: DC | PRN
Start: 2016-05-01 — End: 2016-05-07
  Administered 2016-05-01 – 2016-05-07 (×3): 650 mg via ORAL
  Filled 2016-05-01 (×3): qty 2

## 2016-05-01 MED ORDER — LORAZEPAM 2 MG/ML IJ SOLN
0.5000 mg | Freq: Four times a day (QID) | INTRAMUSCULAR | Status: DC | PRN
  Administered 2016-05-01 – 2016-05-07 (×3): 0.5 mg via INTRAVENOUS
  Filled 2016-05-01 (×3): qty 1

## 2016-05-01 MED ORDER — LACTATED RINGERS IV SOLN
INTRAVENOUS | Status: DC
  Administered 2016-05-01 – 2016-05-03 (×5): via INTRAVENOUS

## 2016-05-01 MED ORDER — LEVOFLOXACIN IN D5W 750 MG/150ML IV SOLN
750.0000 mg | Freq: Every day | INTRAVENOUS | Status: DC
  Administered 2016-05-01: 750 mg via INTRAVENOUS
  Filled 2016-05-01: qty 150

## 2016-05-01 MED ORDER — ASPIRIN EC 81 MG PO TBEC
81.0000 mg | DELAYED_RELEASE_TABLET | Freq: Every day | ORAL | Status: DC
  Administered 2016-05-01 – 2016-05-06 (×4): 81 mg via ORAL
  Filled 2016-05-01 (×3): qty 1

## 2016-05-01 MED ORDER — PRAVASTATIN SODIUM 40 MG PO TABS
40.0000 mg | ORAL_TABLET | Freq: Every day | ORAL | Status: DC
Start: 2016-05-01 — End: 2016-05-07
  Administered 2016-05-04 – 2016-05-06 (×3): 40 mg via ORAL
  Filled 2016-05-01 (×3): qty 1

## 2016-05-01 MED ORDER — ONDANSETRON HCL 4 MG/2ML IJ SOLN: 4.0000 mg | Freq: Four times a day (QID) | INTRAMUSCULAR | Status: DC | PRN

## 2016-05-01 MED ORDER — LEVOFLOXACIN IN D5W 750 MG/150ML IV SOLN
750.0000 mg | INTRAVENOUS | Status: AC
  Administered 2016-05-02 – 2016-05-04 (×2): 750 mg via INTRAVENOUS
  Filled 2016-05-01 (×2): qty 150

## 2016-05-01 MED ORDER — MORPHINE SULFATE (PF) 2 MG/ML IV SOLN
1.0000 mg | INTRAVENOUS | Status: DC | PRN
  Administered 2016-05-01 – 2016-05-06 (×4): 1 mg via INTRAVENOUS
  Filled 2016-05-01 (×4): qty 1

## 2016-05-01 MED ORDER — LOSARTAN POTASSIUM 50 MG PO TABS
100.0000 mg | ORAL_TABLET | Freq: Every day | ORAL | Status: DC
  Administered 2016-05-01 – 2016-05-07 (×5): 100 mg via ORAL
  Filled 2016-05-01 (×5): qty 2

## 2016-05-01 MED ORDER — NICOTINE 21 MG/24HR TD PT24: 21.0000 mg | MEDICATED_PATCH | Freq: Every day | TRANSDERMAL | Status: DC

## 2016-05-01 MED ORDER — ENOXAPARIN SODIUM 40 MG/0.4ML ~~LOC~~ SOLN
40.0000 mg | Freq: Every day | SUBCUTANEOUS | Status: DC
  Administered 2016-05-01 – 2016-05-02 (×2): 40 mg via SUBCUTANEOUS
  Filled 2016-05-01 (×2): qty 0.4

## 2016-05-01 MED ORDER — INFLUENZA VAC SPLIT QUAD 0.5 ML IM SUSY: 0.5000 mL | PREFILLED_SYRINGE | INTRAMUSCULAR | Status: DC

## 2016-05-01 MED ORDER — ACETAMINOPHEN 650 MG RE SUPP: 650.0000 mg | Freq: Four times a day (QID) | RECTAL | Status: DC | PRN

## 2016-05-01 MED ORDER — METOPROLOL SUCCINATE ER 50 MG PO TB24
75.0000 mg | ORAL_TABLET | Freq: Every day | ORAL | Status: DC
  Administered 2016-05-01 – 2016-05-07 (×5): 75 mg via ORAL
  Filled 2016-05-01 (×5): qty 1

## 2016-05-01 MED ORDER — ONDANSETRON HCL 4 MG PO TABS: 4.0000 mg | ORAL_TABLET | Freq: Four times a day (QID) | ORAL | Status: DC | PRN

## 2016-05-01 NOTE — Progress Notes (Signed)
PROGRESS NOTE    Alexis Cobb  ZOX:[id number] DOB: 06/17/1924 DOA: 04/30/2016 PCP: Oliver BarreJames John, MD   Brief Narrative:  Alexis Portaonie R Alexis Cobb is a 82 y.o. female with medical history significant of mitral valve repair, pacemaker placement, HTN, and HLD presenting after she slipped and fell in the bedroom.  She niece (an Charity fundraiserN) had called her about 115 today.  She didn't want to go see doctor despite sounding horrible.  The patient had a scratchy throat Wednesday, but the niece didn't talk to her yesterday.  The patient usually gets hair done Friday, but didn't feel well enough today to get hair done.    The niece called her sister (a speech therapist), who went to see her; the patient wouldn't answer the door.  The patient reports that she slipped and fell; she pressed her life alert - the company notified the RN niece's husband and he called her.  She arrived at the same time as the ambulance.  The patient was between 2 twin beds, had on socks but no slippers on hardwood floors.  Unable to get up. The niece last spoke with her at [id number], arrived at [id number].   Family reports that the patient is very pale.  She has had diarrhea for the last 2 days, had stool incontinence when they found her and she has had 3 watery stools while in the ER.  The patient states that she has never had such a severe sore throat before.  No headache.  No cough, no fever.  Has not been feeling SOB. Admitted for Respiratory Failure, Generalized Weakness and Debility, and Diarrhea.   Assessment & Plan:   Principal Problem:   Acute respiratory failure with hypoxia (HCC) Active Problems:   Essential hypertension   Acute pharyngitis   Diarrhea  Generalized Weakness and Deconditioning from Diarrhea resulting in Fall -CT of Head showed No acute intracranial findings. There is moderate generalized atrophy and chronic appearing white matter hypodensities which likely represent small vessel ischemic disease. -Urinalysis was  Negative -C/w IVF with LR at 75 mL/hr -PT and OT To Evaluate and Treat  Acute Respiratory Failure with Hypoxia suspect Aspiration PNA -Patient who is generally quite healthy (still drives, lives independently, etc) despite age of 82yo presenting with acute onset of debilitating illness -CXR was read as There is emphysematous changes of the lungs. No focal consolidation, pleural effusion, or pneumothorax. The cardiac silhouette is within normal limits. The aorta is mildly tortuous. Median sternotomy wires and left pectoral dual lead pacemaker device noted. There is osteopenia with degenerative changes of the spine. No acute fracture. -Has respiratory failure with hypoxia without apparent indication.  The concern is that this is an early/developing aspiration PNA, particularly in light of the severe sore throat. -This may all be attributable to a viral syndrome.  However, given her age alone her prognosis is guarded with an estimated APACHE II score of 12 and a predicted death rate of 14.6%. -NPO until Swallow Screen done by SLP. -C/w Empiric Levofloxacin as (patient has a PCN/cephalosporin allergy) for now. -Rapid Strep Test Pending -Blood Cx x2 Pending; Patient Afebrile and has no Leukocytosis -C/w -Magic mouthwash with lidocaine for swish and spit; Received Chlorhexidine 15 mL -Will Check Viral Respiratory Panel   Loose Diarrhea -Has not had recurrent episodes today -Will check for C. Difficile  HTN -C/w Metoprolol Succinate 75 mg po Daily and with Losartan 100 mg po Daily.  Hyperlipidemia -C/w Pravastatin 40 mg sq daily    DVT prophylaxis:  Lovenox 40 mg sq Code Status: FULL CODE Family Communication: Discussed with Patient's Nephew at bedside Disposition Plan: Pending PT Evaluation  Consultants:   None   Procedures: None   Antimicrobials: IV Levofloxacin IV q48h for CAP 04/30/16 -->  Subjective: Seen and examined at bedside and stated she was exteremly weak and fell. No  SOB but states she has had diarrhea and none since yesterday. No N/V but complained of a sore throat. No other concerns or complaints at this time. After discussion with patient's nephew he feels she is unable to live at home by herself.   Objective: Vitals:   05/01/16 0055 05/01/16 0122 05/01/16 0517 05/01/16 1116  BP:  (!) 155/72 128/60 (!) 141/65  Pulse:  76 60 64  Resp:  (!) 22 (!) 23   Temp:  99.8 F (37.7 C) (!) 96 F (35.6 C)   TempSrc:  Axillary Axillary   SpO2:  100% 98%   Weight: 46.7 kg (103 lb) 46.7 kg (103 lb)    Height: 5\' 2"  (1.575 m) 5\' 3"  (1.6 m)      Intake/Output Summary (Last 24 hours) at 05/01/16 1325 Last data filed at 05/01/16 0300  Gross per 24 hour  Intake          1354.17 ml  Output                0 ml  Net          1354.17 ml   Filed Weights   05/01/16 0055 05/01/16 0122  Weight: 46.7 kg (103 lb) 46.7 kg (103 lb)    Examination: Physical Exam:  Constitutional: Thin Elderly ill appearing female laying in bed Eyes: Llids and conjunctivae normal, sclerae anicteric  ENMT: External Ears, Nose appear normal. Grossly normal hearing.  Neck: Appears normal, supple, no cervical masses, normal ROM, no appreciable thyromegaly, no JVD Respiratory: Diminished to auscultation bilaterally, no wheezing, rales, rhonchi or crackles. Normal respiratory effort and patient is not tachypenic. No accessory muscle use.  Cardiovascular: RRR, 2/6 Systolic Murmur. No rubs / gallops. S1 and S2 auscultated. Mild LE extremity edema.  Abdomen: Soft, non-tender, non-distended. No masses palpated. No appreciable hepatosplenomegaly. Bowel sounds positive x4.  GU: Deferred. Musculoskeletal: No clubbing / cyanosis of digits/nails. No joint deformity upper and lower extremities.  Skin: Ecchymosis noted on arms.  Neurologic: CN 2-12 grossly intact with no focal deficits. Sensation intact in all 4 Extremities. Romberg sign cerebellar reflexes not assessed.  Psychiatric: Normal judgment  and insight. Alert and oriented x 3. Normal mood and appropriate affect.   Data Reviewed: I have personally reviewed following labs and imaging studies  CBC:  Recent Labs Lab 04/30/16 2027 05/01/16 0219  WBC 6.4 7.8  NEUTROABS 4.8  --   HGB 13.5 13.0  HCT 41.1 39.2  MCV 97.9 97.3  PLT 147* 126*   Basic Metabolic Panel:  Recent Labs Lab 04/30/16 2027 05/01/16 0219  NA 136 135  K 3.9 3.5  CL 103 102  CO2 25 24  GLUCOSE 113* 158*  BUN 12 13  CREATININE 0.81 0.77  CALCIUM 9.0 8.4*   GFR: Estimated Creatinine Clearance: 35.1 mL/min (by C-G formula based on SCr of 0.77 mg/dL). Liver Function Tests:  Recent Labs Lab 04/30/16 2027  AST 39  ALT 23  ALKPHOS 54  BILITOT 0.8  PROT 6.3*  ALBUMIN 3.6   No results for input(s): LIPASE, AMYLASE in the last 168 hours. No results for input(s): AMMONIA in the last 168 hours.  Coagulation Profile: No results for input(s): INR, PROTIME in the last 168 hours. Cardiac Enzymes: No results for input(s): CKTOTAL, CKMB, CKMBINDEX, TROPONINI in the last 168 hours. BNP (last 3 results) No results for input(s): PROBNP in the last 8760 hours. HbA1C: No results for input(s): HGBA1C in the last 72 hours. CBG: No results for input(s): GLUCAP in the last 168 hours. Lipid Profile: No results for input(s): CHOL, HDL, LDLCALC, TRIG, CHOLHDL, LDLDIRECT in the last 72 hours. Thyroid Function Tests: No results for input(s): TSH, T4TOTAL, FREET4, T3FREE, THYROIDAB in the last 72 hours. Anemia Panel: No results for input(s): VITAMINB12, FOLATE, FERRITIN, TIBC, IRON, RETICCTPCT in the last 72 hours. Sepsis Labs: No results for input(s): PROCALCITON, LATICACIDVEN in the last 168 hours.  Recent Results (from the past 240 hour(s))  Rapid strep screen (not at [facility])     Status: None   Collection Time: 05/01/16  1:52 AM  Result Value Ref Range Status   Streptococcus, Group A Screen (Direct) NEGATIVE NEGATIVE Final    Comment: (NOTE) A Rapid  Antigen test may result negative if the antigen level in the sample is below the detection level of this test. The FDA has not cleared this test as a stand-alone test therefore the rapid antigen negative result has reflexed to a Group A Strep culture.     Radiology Studies: Dg Chest 2 View  Result Date: 04/30/2016 CLINICAL DATA:  82 year old female with fall EXAM: CHEST  2 VIEW COMPARISON:  Chest radiograph dated 09/05/2008 FINDINGS: There is emphysematous changes of the lungs. No focal consolidation, pleural effusion, or pneumothorax. The cardiac silhouette is within normal limits. The aorta is mildly tortuous. Median sternotomy wires and left pectoral dual lead pacemaker device noted. There is osteopenia with degenerative changes of the spine. No acute fracture. IMPRESSION: No acute cardiopulmonary process. Emphysema. Electronically Signed   By: Elgie Collard M.D.   On: 04/30/2016 23:43   Dg Pelvis 1-2 Views  Result Date: 04/30/2016 CLINICAL DATA:  Larey Seat at home at 17:00 today. Generalized weakness for several days. EXAM: PELVIS - 1-2 VIEW COMPARISON:  None. FINDINGS: There is no evidence of pelvic fracture or diastasis. No pelvic bone lesions are seen. IMPRESSION: Negative. Electronically Signed   By: Ellery Plunk M.D.   On: 04/30/2016 23:43   Ct Head Wo Contrast  Result Date: 04/30/2016 CLINICAL DATA:  Larey Seat at 17:00 today. Generalized weakness for several days. EXAM: CT HEAD WITHOUT CONTRAST TECHNIQUE: Contiguous axial images were obtained from the base of the skull through the vertex without intravenous contrast. COMPARISON:  None. FINDINGS: Brain: There is no intracranial hemorrhage, mass or evidence of acute infarction. There is moderate generalized atrophy. There is moderate chronic microvascular ischemic change. There is no significant extra-axial fluid collection. No acute intracranial findings are evident. Vascular: No hyperdense vessel or unexpected calcification. Skull:  Normal. Negative for fracture or focal lesion. Sinuses/Orbits: No acute finding. Other: None. IMPRESSION: No acute intracranial findings. There is moderate generalized atrophy and chronic appearing white matter hypodensities which likely represent small vessel ischemic disease. Electronically Signed   By: Ellery Plunk M.D.   On: 04/30/2016 23:29   Scheduled Meds: . aspirin EC  81 mg Oral QHS  . enoxaparin (LOVENOX) injection  40 mg Subcutaneous QHS  . [START ON 05/03/2016] Influenza vac split quadrivalent PF  0.5 mL Intramuscular Tomorrow-1000  . [START ON 05/02/2016] levofloxacin (LEVAQUIN) IV  750 mg Intravenous Q48H  . losartan  100 mg Oral Daily  . metoprolol succinate  75 mg Oral Daily  . pravastatin  40 mg Oral q1800   Continuous Infusions: . lactated ringers 125 mL/hr at 05/01/16 1148     LOS: 0 days   Merlene Laughter, DO [facility] Pager [phone]  If 7PM-7AM, please contact night-coverage [email or web address] Password [email or web address] 05/01/2016, 1:25 PM

## 2016-05-01 NOTE — Progress Notes (Signed)
Modified Barium Swallow Progress Note  Patient Details  Name: Alexis Cobb MRN: [id number] Date of Birth: [birth year]  Today's Date: 05/01/2016  Modified Barium Swallow completed.  Full report located under Chart Review in the Imaging Section.  Brief recommendations include the following:  Clinical Impression  MBS complete. Patient presents with a severe pharyngeal dysphagia characterized by gross weakness resulting in decreased pharyngeal clearance, severe pharyngeal residuals, and decreased airway protection across consistencies. Patient unable to utilize compensatory strategies in attempts to facilitate decreased penetration/aspiration due to AMS. Unclear origin of dysphagia at this time. Question some degree of baseline dysphagia, now exacerbated by acute condition and/or relation to sore throat. Education complete with patient's neice. Will continue to f/u for differential diagnosis, readiness for pos at bedside.    Swallow Evaluation Recommendations       SLP Diet Recommendations: NPO;Alternative means - temporary       Medication Administration: Via alternative means               Oral Care Recommendations: Oral care QID     Ferdinand Lango MA, CCC-SLP [phone]    Alexis Cobb 05/01/2016,3:26 PM

## 2016-05-01 NOTE — Progress Notes (Signed)
PHARMACY NOTE:  ANTIMICROBIAL RENAL DOSAGE ADJUSTMENT  Current antimicrobial regimen includes a mismatch between antimicrobial dosage and estimated renal function.  As per policy approved by the Pharmacy & Therapeutics and Medical Executive Committees, the antimicrobial dosage will be adjusted accordingly.  Current antimicrobial dosage:  Levaquin 750 mg IV q24h x 5 days (first dose given)  Indication: Hypoxemic respiratory failure, r/o early/developing aspiration PNA  Renal Function: Estimated Creatinine Clearance: 35.1 mL/min (by C-G formula based on SCr of 0.77 mg/dL). []      On intermittent HD, scheduled:  []      On CRRT    Antimicrobial dosage has been changed to:  Levaquin 750 mg IV q48h x 4 more days; next dose 12/24 1800 hr.    Thank you for allowing pharmacy to be a part of this patient's care.  , PharmD, BCPS Pager: [phone] 05/01/2016  4:15 AM

## 2016-05-01 NOTE — ED Notes (Signed)
Hospitalist at bedside 

## 2016-05-01 NOTE — Evaluation (Signed)
Clinical/Bedside Swallow Evaluation Patient Details  Name: Alexis Cobb MRN: [id number] Date of Birth: 11-30-1924  Today's Date: 05/01/2016 Time: SLP Start Time (ACUTE ONLY): 1335 SLP Stop Time (ACUTE ONLY): 1350 SLP Time Calculation (min) (ACUTE ONLY): 15 min  Past Medical History:  Past Medical History:  Diagnosis Date  . Arthritis   . ARTHRITIS, RHEUMATOID, HX OF 01/06/2007  . Cervical stenosis of spine   . HTN (hypertension)   . Hyperlipidemia   . MITRAL REGURGITATION 01/06/2007  . OSTEOARTHRITIS 01/14/2007  . OSTEOPOROSIS 01/14/2007  . Other specified forms of hearing loss 08/01/2009  . PACEMAKER, PERMANENT 01/06/2007  . Raynaud's syndrome 01/06/2007   Past Surgical History:  Past Surgical History:  Procedure Laterality Date  . ABDOMINAL HYSTERECTOMY    . APPENDECTOMY    . CATARACT EXTRACTION, BILATERAL    . MITRAL VALVE REPAIR    . ovarian tumor, benign    . PACEMAKER INSERTION     HPI:  82 year old female admitted s/p fall at home, presenting with respiratory failure with hypoxia, question early developing aspiration PNA, severe throat, diarrhea. CXR negative.   Assessment / Plan / Recommendation Clinical Impression  Patient presents with evidence of a pharyngeal phase dysphagia characterized by consistent multiple swallows and throat clearing/coughing post po intake with most consistencies tested. Unclear origin. MBS scheduled for this afternoon to determine extent and origin of dysphagia.     Aspiration Risk  Severe aspiration risk    Diet Recommendation NPO   Medication Administration: Via alternative means    Other  Recommendations Oral Care Recommendations: Oral care QID             Prognosis Prognosis for Safe Diet Advancement: Good Barriers to Reach Goals: Severity of deficits      Swallow Study   General HPI: 82 year old female admitted s/p fall at home, presenting with respiratory failure with hypoxia, question early developing aspiration PNA,  severe throat, diarrhea. CXR negative. Type of Study: Bedside Swallow Evaluation Previous Swallow Assessment: none Diet Prior to this Study: NPO Temperature Spikes Noted: No Respiratory Status: Room air History of Recent Intubation: No Behavior/Cognition: Alert;Cooperative;Pleasant mood Oral Cavity Assessment: Dry Oral Care Completed by SLP: No Oral Cavity - Dentition: Adequate natural dentition Vision: Functional for self-feeding Self-Feeding Abilities: Able to feed self;Needs assist Patient Positioning: Upright in bed Baseline Vocal Quality: Hoarse Volitional Cough: Strong Volitional Swallow: Able to elicit    Oral/Motor/Sensory Function Overall Oral Motor/Sensory Function: Generalized oral weakness   Ice Chips Ice chips: Not tested   Thin Liquid Thin Liquid: Impaired Pharyngeal  Phase Impairments: Multiple swallows;Throat Clearing - Immediate;Cough - Immediate    Nectar Thick Nectar Thick Liquid: Not tested   Honey Thick Honey Thick Liquid: Not tested   Puree Puree: Impaired Presentation: Spoon Pharyngeal Phase Impairments: Multiple swallows;Throat Clearing - Immediate   Solid   GO   Solid: Within functional limits Presentation: Self Fed       Hanin Decook MA, CCC-SLP [phone]  Gwenith Tschida Meryl 05/01/2016,1:55 PM

## 2016-05-02 DIAGNOSIS — B348 Other viral infections of unspecified site: Secondary | ICD-10-CM

## 2016-05-02 DIAGNOSIS — B9789 Other viral agents as the cause of diseases classified elsewhere: Secondary | ICD-10-CM

## 2016-05-02 DIAGNOSIS — J028 Acute pharyngitis due to other specified organisms: Secondary | ICD-10-CM

## 2016-05-02 LAB — COMPREHENSIVE METABOLIC PANEL
ALK PHOS: 41 U/L (ref 38–126)
ALT: 20 U/L (ref 14–54)
AST: 49 U/L — ABNORMAL HIGH (ref 15–41)
Albumin: 3.1 g/dL — ABNORMAL LOW (ref 3.5–5.0)
Anion gap: 7 (ref 5–15)
BILIRUBIN TOTAL: 0.8 mg/dL (ref 0.3–1.2)
BUN: 13 mg/dL (ref 6–20)
CALCIUM: 8.4 mg/dL — AB (ref 8.9–10.3)
CO2: 26 mmol/L (ref 22–32)
CREATININE: 0.67 mg/dL (ref 0.44–1.00)
Chloride: 103 mmol/L (ref 101–111)
GFR calc non Af Amer: >60 mL/min (ref >60)
GLUCOSE: 108 mg/dL — AB (ref 65–99)
Potassium: 3.1 mmol/L — ABNORMAL LOW (ref 3.5–5.1)
SODIUM: 136 mmol/L (ref 135–145)
TOTAL PROTEIN: 5.3 g/dL — AB (ref 6.5–8.1)

## 2016-05-02 LAB — CBC WITH DIFFERENTIAL/PLATELET
Basophils Absolute: 0 10*3/uL (ref 0.0–0.1)
Basophils Relative: 0 %
Eosinophils Absolute: 0 10*3/uL (ref 0.0–0.7)
Eosinophils Relative: 0 %
HEMATOCRIT: 34.5 % — AB (ref 36.0–46.0)
HEMOGLOBIN: 11.8 g/dL — AB (ref 12.0–15.0)
LYMPHS ABS: 1.6 10*3/uL (ref 0.7–4.0)
LYMPHS PCT: 15 %
MCH: 32.6 pg (ref 26.0–34.0)
MCHC: 34.2 g/dL (ref 30.0–36.0)
MCV: 95.3 fL (ref 78.0–100.0)
MONOS PCT: 6 %
Monocytes Absolute: 0.6 10*3/uL (ref 0.1–1.0)
NEUTROS ABS: 8.8 10*3/uL — AB (ref 1.7–7.7)
NEUTROS PCT: 79 %
Platelets: 127 10*3/uL — ABNORMAL LOW (ref 150–400)
RBC: 3.62 MIL/uL — AB (ref 3.87–5.11)
RDW: 14.8 % (ref 11.5–15.5)
WBC: 11.1 10*3/uL — AB (ref 4.0–10.5)

## 2016-05-02 LAB — RESPIRATORY PANEL BY PCR
ADENOVIRUS-RVPPCR: NOT DETECTED
BORDETELLA PERTUSSIS-RVPCR: NOT DETECTED
CHLAMYDOPHILA PNEUMONIAE-RVPPCR: NOT DETECTED
CORONAVIRUS 229E-RVPPCR: NOT DETECTED
CORONAVIRUS HKU1-RVPPCR: NOT DETECTED
CORONAVIRUS NL63-RVPPCR: NOT DETECTED
Coronavirus OC43: NOT DETECTED
Influenza A: NOT DETECTED
Influenza B: NOT DETECTED
MYCOPLASMA PNEUMONIAE-RVPPCR: NOT DETECTED
Metapneumovirus: NOT DETECTED
Parainfluenza Virus 1: DETECTED — AB
Parainfluenza Virus 2: NOT DETECTED
Parainfluenza Virus 3: NOT DETECTED
Parainfluenza Virus 4: NOT DETECTED
RHINOVIRUS / ENTEROVIRUS - RVPPCR: NOT DETECTED
Respiratory Syncytial Virus: NOT DETECTED

## 2016-05-02 LAB — MAGNESIUM: Magnesium: 1.6 mg/dL — ABNORMAL LOW (ref 1.7–2.4)

## 2016-05-02 LAB — PHOSPHORUS: Phosphorus: 2.7 mg/dL (ref 2.5–4.6)

## 2016-05-02 MED ORDER — SODIUM CHLORIDE 0.9 % IV SOLN: 30.0000 meq | Freq: Once | INTRAVENOUS | Status: DC

## 2016-05-02 MED ORDER — MAGNESIUM SULFATE 2 GM/50ML IV SOLN
2.0000 g | Freq: Once | INTRAVENOUS | Status: AC
  Administered 2016-05-02: 2 g via INTRAVENOUS
  Filled 2016-05-02: qty 50

## 2016-05-02 MED ORDER — POTASSIUM CHLORIDE 10 MEQ/100ML IV SOLN
10.0000 meq | INTRAVENOUS | Status: AC
  Administered 2016-05-02 (×3): 10 meq via INTRAVENOUS
  Filled 2016-05-02 (×3): qty 100

## 2016-05-02 NOTE — Evaluation (Signed)
Occupational Therapy Evaluation Patient Details Name: Alexis Cobb MRN: [id number] DOB: 06/30/1924 Today's Date: 05/02/2016    History of Present Illness pt was admitted after a fall. PMH significant for MVR, pacemaker, RA   Clinical Impression   This 82 year old female was admitted for the above.  She was independent/mod I at baseline and now needs up to total +2 assistance. Sat EOB but did not mobilize beyond this today for safety.  Will follow in acute setting and pt will benefit from follow up OT after acute stay    Follow Up Recommendations  SNF    Equipment Recommendations  3 in 1 bedside commode    Recommendations for Other Services       Precautions / Restrictions Precautions Precautions: Fall Restrictions Weight Bearing Restrictions: No  Pt had soft cervical collar in room, she states she wears it for comfort when needed     Mobility Bed Mobility Overal bed mobility: Needs Assistance Bed Mobility: Rolling;Supine to Sit;Sit to Supine Rolling: Total assist (to L, painful)   Supine to sit: Total assist;+2 for physical assistance;HOB elevated Sit to supine: Total assist;+2 for physical assistance;HOB elevated   General bed mobility comments: pt helped a small amount  Transfers                      Balance Overall balance assessment: History of Falls;Needs assistance     Sitting balance - Comments: min guard for safety:  pt fatiques easily                                    ADL Overall ADL's : Needs assistance/impaired     Grooming: Moderate assistance;Bed level;Wash/dry hands;Wash/dry face   Upper Body Bathing: Maximal assistance;Bed level   Lower Body Bathing: Total assistance;+2 for physical assistance;Bed level   Upper Body Dressing : Maximal assistance;Bed level   Lower Body Dressing: Total assistance;+2 for physical assistance;Bed level                 General ADL Comments: sat EOB with max/total +2  assist (second person due to pain).  Mostly held balance but did lean and support herself once.  lifted legs just a little to march:  did not feel safe trying transfer to [facility].  Placed bedpan under her, which she used.  She had R thigh pain when rolling to L (total A).  Assisted her to bridge which was much more comfortable     Vision     Perception     Praxis      Pertinent Vitals/Pain Pain Assessment: Faces Faces Pain Scale: Hurts even more Pain Location: R thigh Pain Descriptors / Indicators: Sore Pain Intervention(s): Limited activity within patient's tolerance;Monitored during session;Repositioned     Hand Dominance     Extremity/Trunk Assessment Upper Extremity Assessment Upper Extremity Assessment: Generalized weakness (moves to 60 slowly with RUE (bruised) 90 with LUE)           Communication Communication Communication:  (difficult to understand at times)   Cognition Arousal/Alertness: Awake/alert Behavior During Therapy: WFL for tasks assessed/performed Overall Cognitive Status: No family/caregiver present to determine baseline cognitive functioning                 General Comments: slow motoric responses, oriented to self and situation, and date (not place)   General Comments       Exercises  Shoulder Instructions      Home Living Family/patient expects to be discharged to:: Unsure Living Arrangements: Alone                               Additional Comments: pt will likely need SNF      Prior Functioning/Environment Level of Independence: Independent;Independent with assistive device(s)        Comments: pt sometimes used RW and per chart, she drove        OT Problem List: Decreased strength;Decreased activity tolerance;Impaired balance (sitting and/or standing);Decreased knowledge of use of DME or AE;Pain   OT Treatment/Interventions: Self-care/ADL training;DME and/or AE instruction;Neuromuscular education;Therapeutic  activities;Patient/family education;Balance training    OT Goals(Current goals can be found in the care plan section) Acute Rehab OT Goals Patient Stated Goal: none stated OT Goal Formulation: With patient Time For Goal Achievement: 05/16/16 Potential to Achieve Goals: Fair ADL Goals Pt Will Transfer to Toilet: with mod assist;with +2 assist;bedside commode;stand pivot transfer Additional ADL Goal #1: pt will sit eob and perform UB adls with min guard/set up Additional ADL Goal #2: pt will perform 2 sets of 5 A/AAROM shoulder exercises x 2 motions to increase strength for adls  OT Frequency: Min 2X/week   Barriers to D/C:            Co-evaluation              End of Session Nurse Communication:  (bedpan, small smear of stool)  Activity Tolerance: Patient limited by fatigue;Patient limited by pain Patient left: in bed;with call bell/phone within reach;with bed alarm set   Time: 2536-6440 OT Time Calculation (min): 34 min Charges:  OT General Charges $OT Visit: 1 Procedure OT Evaluation $OT Eval Low Complexity: 1 Procedure OT Treatments $Self Care/Home Management : 8-22 mins G-Codes:    Alexis Cobb 05/10/2016, 9:37 AM Alexis Cobb, OTR/L [phone] 05/10/16

## 2016-05-02 NOTE — Progress Notes (Signed)
PROGRESS NOTE    Alexis Cobb  PIR:[id number] DOB: 10-04-1924 DOA: 04/30/2016 PCP: Oliver Barre, MD   Brief Narrative:  Alexis Cobb is a 82 y.o. female with medical history significant of mitral valve repair, pacemaker placement, HTN, and HLD presenting after she slipped and fell in the bedroom.  She niece (an Charity fundraiser) had called her about 115 today.  She didn't want to go see doctor despite sounding horrible.  The patient had a scratchy throat Wednesday, but the niece didn't talk to her yesterday.  The patient usually gets hair done Friday, but didn't feel well enough today to get hair done.    The niece called her sister (a speech therapist), who went to see her; the patient wouldn't answer the door.  The patient reports that she slipped and fell; she pressed her life alert - the company notified the RN niece's husband and he called her.  She arrived at the same time as the ambulance.  The patient was between 2 twin beds, had on socks but no slippers on hardwood floors.  Unable to get up. The niece last spoke with her at 51, arrived at 61.   Family reports that the patient is very pale.  She has had diarrhea for the last 2 days, had stool incontinence when they found her and she has had 3 watery stools while in the ER.  The patient states that she has never had such a severe sore throat before.  No headache.  No cough, no fever.  Has not been feeling SOB. Admitted for Respiratory Failure, Generalized Weakness and Debility, and Diarrhea. Found to have Parainfluenza 1 Virus.  Assessment & Plan:   Principal Problem:   Infection due to parainfluenza virus 1 Active Problems:   Essential hypertension   Acute respiratory failure with hypoxia (HCC)   Acute pharyngitis   Diarrhea   Parainfluenza virus pharyngitis  Generalized Weakness and Deconditioning from Parainfluenza Virus and Diarrhea resulting in Fall -CT of Head showed No acute intracranial findings. There is moderate  generalized atrophy and chronic appearing white matter hypodensities which likely represent small vessel ischemic disease. -Urinalysis was Negative -C/w IVF with LR at 75 mL/hr -PT and OT Recommend SNF -Teaching laboratory technician for Skilled Nursing Placement  Acute Respiratory Failure with Hypoxia suspect Aspiration PNA -Patient who is generally quite healthy (still drives, lives independently, etc) despite age of 82yo presenting with acute onset of debilitating illness -CXR was read as There is emphysematous changes of the lungs. No focal consolidation, pleural effusion, or pneumothorax. The cardiac silhouette is within normal limits. The aorta is mildly tortuous. Median sternotomy wires and left pectoral dual lead pacemaker device noted. There is osteopenia with degenerative changes of the spine. No acute fracture. -Has respiratory failure with hypoxia without apparent indication.  The concern is that this is an early/developing aspiration PNA, particularly in light of the severe sore throat. -This may all be attributable to a viral syndrome.  However, given her age alone her prognosis is guarded with an estimated APACHE II score of 12 and a predicted death rate of 14.6%. -NPO as Patient failed Swallow Study -C/w Empiric Levofloxacin as (patient has a PCN/cephalosporin allergy) for now. -Rapid Strep Negative -Blood Cx x2 showed NGTD; Patient's WBC went from 7.8 -> 11.1 -C/w -Magic mouthwash with lidocaine for swish and spit; Received Chlorhexidine 15 mL -Respiratory Panel Positive for Parainfluenza Virus 1 -Palliative Care Consulted for Goals of Care   Loose Diarrhea -Has not had recurrent episodes  today -Will check for C. Difficile but no more episodes now  HTN -C/w Metoprolol Succinate 75 mg po Daily and with Losartan 100 mg po Daily.  Hyperlipidemia -C/w Pravastatin 40 mg sq daily   Hypokalemia -Patient's K+ was 3.1 -Replete via IV as patient is NPO -Repeat CMP in AM  DVT  prophylaxis: Lovenox 40 mg sq Code Status: FULL CODE Family Communication: Discussed with Patient's Niece over the Phone. Palliative Consulted for Goals of Care Disposition Plan: SNF  Consultants:   Palliative Care   Procedures: Swallow Screen   Antimicrobials: IV Levofloxacin IV q48h for CAP 04/30/16 -->  Subjective: Seen and examined at bedside and looked more weak. Stated she still had a throat ache and no more diarrhea. No N/V. No CP or SOB but had a cough.    Objective: Vitals:   05/01/16 1516 05/01/16 1956 05/02/16 0451 05/02/16 1426  BP: (!) 175/77 (!) 167/77 (!) 162/84 114/60  Pulse: 71 71 73 65  Resp: 20 18 19 18   Temp: 97.5 F (36.4 C) 97.7 F (36.5 C) 97.1 F (36.2 C) 97.8 F (36.6 C)  TempSrc: Axillary Oral Oral Axillary  SpO2: 99% 97% 99% 99%  Weight:      Height:        Intake/Output Summary (Last 24 hours) at 05/02/16 1743 Last data filed at 05/02/16 1732  Gross per 24 hour  Intake             1660 ml  Output              250 ml  Net             1410 ml   Filed Weights   05/01/16 0055 05/01/16 0122  Weight: 46.7 kg (103 lb) 46.7 kg (103 lb)    Examination: Physical Exam:  Constitutional: Thin Elderly ill appearing female laying in bed appeared worse today Eyes: Llids and conjunctivae normal, sclerae anicteric  ENMT: External Ears, Nose appear normal. Grossly normal hearing.  Neck: Appears normal, supple, no cervical masses, normal ROM, no appreciable thyromegaly, no JVD Respiratory: Diminished to auscultation bilaterally, no wheezing, rales, rhonchi or crackles. Normal respiratory effort and patient is not tachypenic. No accessory muscle use. Mild cough.  Cardiovascular: RRR, 2/6 Systolic Murmur. No rubs / gallops. S1 and S2 auscultated. Mild LE extremity edema.  Abdomen: Soft, non-tender, non-distended. No masses palpated. No appreciable hepatosplenomegaly. Bowel sounds positive x4.  GU: Deferred. Musculoskeletal: No clubbing / cyanosis of  digits/nails. No joint deformity upper and lower extremities.  Skin: Ecchymosis noted on arms.  Neurologic: CN 2-12 grossly intact with no focal deficits. Sensation intact in all 4 Extremities. Romberg sign cerebellar reflexes not assessed.  Psychiatric: Normal judgment and insight. Alert and oriented x 3. Normal mood and appropriate affect.   Data Reviewed: I have personally reviewed following labs and imaging studies  CBC:  Recent Labs Lab 04/30/16 2027 05/01/16 0219 05/02/16 0551  WBC 6.4 7.8 11.1*  NEUTROABS 4.8  --  8.8*  HGB 13.5 13.0 11.8*  HCT 41.1 39.2 34.5*  MCV 97.9 97.3 95.3  PLT 147* 126* 127*   Basic Metabolic Panel:  Recent Labs Lab 04/30/16 2027 05/01/16 0219 05/02/16 0551  NA 136 135 136  K 3.9 3.5 3.1*  CL 103 102 103  CO2 25 24 26   GLUCOSE 113* 158* 108*  BUN 12 13 13   CREATININE 0.81 0.77 0.67  CALCIUM 9.0 8.4* 8.4*  MG  --   --  1.6*  PHOS  --   --  2.7   GFR: Estimated Creatinine Clearance: 35.1 mL/min (by C-G formula based on SCr of 0.67 mg/dL). Liver Function Tests:  Recent Labs Lab 04/30/16 2027 05/02/16 0551  AST 39 49*  ALT 23 20  ALKPHOS 54 41  BILITOT 0.8 0.8  PROT 6.3* 5.3*  ALBUMIN 3.6 3.1*   No results for input(s): LIPASE, AMYLASE in the last 168 hours. No results for input(s): AMMONIA in the last 168 hours. Coagulation Profile: No results for input(s): INR, PROTIME in the last 168 hours. Cardiac Enzymes: No results for input(s): CKTOTAL, CKMB, CKMBINDEX, TROPONINI in the last 168 hours. BNP (last 3 results) No results for input(s): PROBNP in the last 8760 hours. HbA1C: No results for input(s): HGBA1C in the last 72 hours. CBG: No results for input(s): GLUCAP in the last 168 hours. Lipid Profile: No results for input(s): CHOL, HDL, LDLCALC, TRIG, CHOLHDL, LDLDIRECT in the last 72 hours. Thyroid Function Tests: No results for input(s): TSH, T4TOTAL, FREET4, T3FREE, THYROIDAB in the last 72 hours. Anemia Panel: No  results for input(s): VITAMINB12, FOLATE, FERRITIN, TIBC, IRON, RETICCTPCT in the last 72 hours. Sepsis Labs: No results for input(s): PROCALCITON, LATICACIDVEN in the last 168 hours.  Recent Results (from the past 240 hour(s))  Rapid strep screen (not at [facility])     Status: None   Collection Time: 05/01/16  1:52 AM  Result Value Ref Range Status   Streptococcus, Group A Screen (Direct) NEGATIVE NEGATIVE Final    Comment: (NOTE) A Rapid Antigen test may result negative if the antigen level in the sample is below the detection level of this test. The FDA has not cleared this test as a stand-alone test therefore the rapid antigen negative result has reflexed to a Group A Strep culture.   Culture, group A strep     Status: None (Preliminary result)   Collection Time: 05/01/16  1:52 AM  Result Value Ref Range Status   Specimen Description THROAT  Final   Special Requests NONE Reflexed from [id number]  Final   Culture   Final    CULTURE REINCUBATED FOR BETTER GROWTH Performed at [facility]    Report Status PENDING  Incomplete  Culture, blood (routine x 2) Call MD if unable to obtain prior to antibiotics being given     Status: None (Preliminary result)   Collection Time: 05/01/16  2:19 AM  Result Value Ref Range Status   Specimen Description BLOOD RIGHT ARM  Final   Special Requests BOTTLES DRAWN AEROBIC ONLY 7 CC  Final   Culture   Final    NO GROWTH < 24 HOURS Performed at [facility]    Report Status PENDING  Incomplete  Culture, blood (routine x 2) Call MD if unable to obtain prior to antibiotics being given     Status: None (Preliminary result)   Collection Time: 05/01/16  2:19 AM  Result Value Ref Range Status   Specimen Description BLOOD RIGHT WRIST  Final   Special Requests BOTTLES DRAWN AEROBIC AND ANAEROBIC 5 CC  Final   Culture   Final    NO GROWTH < 24 HOURS Performed at [facility]    Report Status PENDING  Incomplete  Respiratory Panel by PCR      Status: Abnormal   Collection Time: 05/01/16  4:04 PM  Result Value Ref Range Status   Adenovirus NOT DETECTED NOT DETECTED Final   Coronavirus 229E NOT DETECTED NOT DETECTED Final   Coronavirus HKU1 NOT DETECTED NOT DETECTED  Final   Coronavirus NL63 NOT DETECTED NOT DETECTED Final   Coronavirus OC43 NOT DETECTED NOT DETECTED Final   Metapneumovirus NOT DETECTED NOT DETECTED Final   Rhinovirus / Enterovirus NOT DETECTED NOT DETECTED Final   Influenza A NOT DETECTED NOT DETECTED Final   Influenza B NOT DETECTED NOT DETECTED Final   Parainfluenza Virus 1 DETECTED (A) NOT DETECTED Final   Parainfluenza Virus 2 NOT DETECTED NOT DETECTED Final   Parainfluenza Virus 3 NOT DETECTED NOT DETECTED Final   Parainfluenza Virus 4 NOT DETECTED NOT DETECTED Final   Respiratory Syncytial Virus NOT DETECTED NOT DETECTED Final   Bordetella pertussis NOT DETECTED NOT DETECTED Final   Chlamydophila pneumoniae NOT DETECTED NOT DETECTED Final   Mycoplasma pneumoniae NOT DETECTED NOT DETECTED Final    Comment: Performed at [facility]    Radiology Studies: Dg Chest 2 View  Result Date: 04/30/2016 CLINICAL DATA:  82 year old female with fall EXAM: CHEST  2 VIEW COMPARISON:  Chest radiograph dated 09/05/2008 FINDINGS: There is emphysematous changes of the lungs. No focal consolidation, pleural effusion, or pneumothorax. The cardiac silhouette is within normal limits. The aorta is mildly tortuous. Median sternotomy wires and left pectoral dual lead pacemaker device noted. There is osteopenia with degenerative changes of the spine. No acute fracture. IMPRESSION: No acute cardiopulmonary process. Emphysema. Electronically Signed   By: Elgie Collard M.D.   On: 04/30/2016 23:43   Dg Pelvis 1-2 Views  Result Date: 04/30/2016 CLINICAL DATA:  Larey Seat at home at 17:00 today. Generalized weakness for several days. EXAM: PELVIS - 1-2 VIEW COMPARISON:  None. FINDINGS: There is no evidence of pelvic fracture or  diastasis. No pelvic bone lesions are seen. IMPRESSION: Negative. Electronically Signed   By: Ellery Plunk M.D.   On: 04/30/2016 23:43   Ct Head Wo Contrast  Result Date: 04/30/2016 CLINICAL DATA:  Larey Seat at 17:00 today. Generalized weakness for several days. EXAM: CT HEAD WITHOUT CONTRAST TECHNIQUE: Contiguous axial images were obtained from the base of the skull through the vertex without intravenous contrast. COMPARISON:  None. FINDINGS: Brain: There is no intracranial hemorrhage, mass or evidence of acute infarction. There is moderate generalized atrophy. There is moderate chronic microvascular ischemic change. There is no significant extra-axial fluid collection. No acute intracranial findings are evident. Vascular: No hyperdense vessel or unexpected calcification. Skull: Normal. Negative for fracture or focal lesion. Sinuses/Orbits: No acute finding. Other: None. IMPRESSION: No acute intracranial findings. There is moderate generalized atrophy and chronic appearing white matter hypodensities which likely represent small vessel ischemic disease. Electronically Signed   By: Ellery Plunk M.D.   On: 04/30/2016 23:29   Dg Swallowing Func-speech Pathology  Result Date: 05/01/2016 Objective Swallowing Evaluation: Type of Study: MBS-Modified Barium Swallow Study Patient Details Name: SONITA FOOS MRN: [id number] Date of Birth: 10-Sep-1924 Today's Date: 05/01/2016 Time: SLP Start Time (ACUTE ONLY): 1440-SLP Stop Time (ACUTE ONLY): 1510 SLP Time Calculation (min) (ACUTE ONLY): 30 min Past Medical History: Past Medical History: Diagnosis Date . Arthritis  . ARTHRITIS, RHEUMATOID, HX OF 01/06/2007 . Cervical stenosis of spine  . HTN (hypertension)  . Hyperlipidemia  . MITRAL REGURGITATION 01/06/2007 . OSTEOARTHRITIS 01/14/2007 . OSTEOPOROSIS 01/14/2007 . Other specified forms of hearing loss 08/01/2009 . PACEMAKER, PERMANENT 01/06/2007 . Raynaud's syndrome 01/06/2007 . Past Surgical History: Past Surgical  History: Procedure Laterality Date . ABDOMINAL HYSTERECTOMY   . APPENDECTOMY   . CATARACT EXTRACTION, BILATERAL   . MITRAL VALVE REPAIR   . ovarian tumor, benign   .  PACEMAKER INSERTION   HPI: 82 year old female admitted s/p fall at home, presenting with respiratory failure with hypoxia, question early developing aspiration PNA, severe throat, diarrhea. CXR negative. No Data Recorded Assessment / Plan / Recommendation CHL IP CLINICAL IMPRESSIONS 05/01/2016 Therapy Diagnosis Severe pharyngeal phase dysphagia Clinical Impression MBS complete. Patient presents with a severe pharyngeal dysphagia characterized by gross weakness resulting in decreased pharyngeal clearance, severe pharyngeal residuals, and decreased airway protection across consistencies. Patient unable to utilize compensatory strategies in attempts to facilitate decreased penetration/aspiration due to AMS. Unclear origin of dysphagia at this time. Question some degree of baseline dysphagia, now exacerbated by acute condition and/or relation to sore throat. Education complete with patient's neice. Will continue to f/u for differential diagnosis, readiness for pos at bedside.  Impact on safety and function Severe aspiration risk   CHL IP TREATMENT RECOMMENDATION 05/01/2016 Treatment Recommendations Therapy as outlined in treatment plan below   Prognosis 05/01/2016 Prognosis for Safe Diet Advancement Good Barriers to Reach Goals Severity of deficits Barriers/Prognosis Comment -- CHL IP DIET RECOMMENDATION 05/01/2016 SLP Diet Recommendations NPO;Alternative means - temporary Liquid Administration via -- Medication Administration Via alternative means Compensations -- Postural Changes --   CHL IP OTHER RECOMMENDATIONS 05/01/2016 Recommended Consults -- Oral Care Recommendations Oral care QID Other Recommendations --   CHL IP FOLLOW UP RECOMMENDATIONS 05/01/2016 Follow up Recommendations Other (comment)   CHL IP FREQUENCY AND DURATION 05/01/2016 Speech Therapy  Frequency (ACUTE ONLY) min 3x week Treatment Duration 2 weeks      CHL IP ORAL PHASE 05/01/2016 Oral Phase WFL Oral - Pudding Teaspoon -- Oral - Pudding Cup -- Oral - Honey Teaspoon -- Oral - Honey Cup -- Oral - Nectar Teaspoon -- Oral - Nectar Cup -- Oral - Nectar Straw -- Oral - Thin Teaspoon -- Oral - Thin Cup -- Oral - Thin Straw -- Oral - Puree -- Oral - Mech Soft -- Oral - Regular -- Oral - Multi-Consistency -- Oral - Pill -- Oral Phase - Comment --  CHL IP PHARYNGEAL PHASE 05/01/2016 Pharyngeal Phase Impaired Pharyngeal- Pudding Teaspoon -- Pharyngeal -- Pharyngeal- Pudding Cup -- Pharyngeal -- Pharyngeal- Honey Teaspoon -- Pharyngeal -- Pharyngeal- Honey Cup -- Pharyngeal -- Pharyngeal- Nectar Teaspoon Delayed swallow initiation-vallecula;Delayed swallow initiation-pyriform sinuses;Reduced pharyngeal peristalsis;Reduced epiglottic inversion;Reduced anterior laryngeal mobility;Reduced laryngeal elevation;Reduced airway/laryngeal closure;Reduced tongue base retraction;Penetration/Aspiration before swallow;Penetration/Aspiration during swallow;Penetration/Apiration after swallow;Moderate aspiration;Pharyngeal residue - valleculae;Pharyngeal residue - pyriform Pharyngeal Material enters airway, passes BELOW cords without attempt by patient to eject out (silent aspiration) Pharyngeal- Nectar Cup -- Pharyngeal -- Pharyngeal- Nectar Straw -- Pharyngeal -- Pharyngeal- Thin Teaspoon Delayed swallow initiation-vallecula;Delayed swallow initiation-pyriform sinuses;Reduced pharyngeal peristalsis;Reduced epiglottic inversion;Reduced anterior laryngeal mobility;Reduced laryngeal elevation;Reduced airway/laryngeal closure;Reduced tongue base retraction;Penetration/Aspiration before swallow;Penetration/Aspiration during swallow;Penetration/Apiration after swallow;Moderate aspiration;Pharyngeal residue - valleculae;Pharyngeal residue - pyriform Pharyngeal Material enters airway, passes BELOW cords without attempt by patient  to eject out (silent aspiration) Pharyngeal- Thin Cup -- Pharyngeal -- Pharyngeal- Thin Straw -- Pharyngeal -- Pharyngeal- Puree Delayed swallow initiation-vallecula;Delayed swallow initiation-pyriform sinuses;Reduced pharyngeal peristalsis;Reduced epiglottic inversion;Reduced anterior laryngeal mobility;Reduced laryngeal elevation;Reduced airway/laryngeal closure;Reduced tongue base retraction;Penetration/Aspiration before swallow;Penetration/Aspiration during swallow;Penetration/Apiration after swallow;Pharyngeal residue - valleculae;Pharyngeal residue - pyriform Pharyngeal Material enters airway, CONTACTS cords and not ejected out Pharyngeal- Mechanical Soft -- Pharyngeal -- Pharyngeal- Regular -- Pharyngeal -- Pharyngeal- Multi-consistency -- Pharyngeal -- Pharyngeal- Pill -- Pharyngeal -- Pharyngeal Comment --  Ferdinand Lango MA, CCC-SLP [phone] McCoy Leah Meryl 05/01/2016, 3:26 PM  Scheduled Meds: . aspirin EC  81 mg Oral QHS  . enoxaparin (LOVENOX) injection  40 mg Subcutaneous QHS  . [START ON 05/03/2016] Influenza vac split quadrivalent PF  0.5 mL Intramuscular Tomorrow-1000  . levofloxacin (LEVAQUIN) IV  750 mg Intravenous Q48H  . losartan  100 mg Oral Daily  . metoprolol succinate  75 mg Oral Daily  . pravastatin  40 mg Oral q1800   Continuous Infusions: . lactated ringers 75 mL/hr at 05/02/16 1009    LOS: 1 day   Merlene Laughter, DO [facility] Pager [phone]  If 7PM-7AM, please contact night-coverage [email or web address] Password [facility] 05/02/2016, 5:43 PM

## 2016-05-02 NOTE — Evaluation (Addendum)
Physical Therapy Evaluation Patient Details Name: Alexis Cobb MRN: [id number] DOB: 02-04-1925 Today's Date: 05/02/2016   History of Present Illness  83 yo female admitted with acute respiratory failure, fall at home. Hx of pacemaker, Oa, HTN, osteoporosis, RA  Clinical Impression  On eval, pt required Max assist to get halfway to EOB. Pt was able to tolerate gentle ROM of bil hips and knees without increased pain. However, once performing bed mobility, pt c/o bil hip/thigh area pain, R >L. Pt somewhat lethargic and very weak. Pt will need SNF for continued rehab.     Follow Up Recommendations SNF    Equipment Recommendations  None recommended by PT    Recommendations for Other Services OT consult     Precautions / Restrictions Precautions Precautions: Fall Restrictions Weight Bearing Restrictions: No      Mobility  Bed Mobility Overal bed mobility: Needs Assistance Bed Mobility: Supine to Sit;Sit to Supine Rolling: Total assist (to L, painful)   Supine to sit: Max assist Sit to supine: Max assist   General bed mobility comments: Assist for trunk and bil LEs to get partially to EOB. Increased time. Pt only able to assist a small amount. C/O LE pain so returned to supine.  Transfers                 General transfer comment: NT-pt unable at this time  Ambulation/Gait                Stairs            Wheelchair Mobility    Modified Rankin (Stroke Patients Only)       Balance Overall balance assessment: History of Falls     Sitting balance - Comments: min guard for safety:  pt fatiques easily                                     Pertinent Vitals/Pain Pain Assessment: Faces Faces Pain Scale: Hurts even more Pain Location: LEs, R >L (pt holding at R hip area) Pain Descriptors / Indicators: Guarding;Sore Pain Intervention(s): Limited activity within patient's tolerance    Home Living Family/patient expects to be  discharged to:: Skilled nursing facility Living Arrangements: Alone               Additional Comments: pt will likely need SNF    Prior Function Level of Independence: Independent with assistive device(s)         Comments: pt sometimes used RW and per chart, she drove     Hand Dominance        Extremity/Trunk Assessment   Upper Extremity Assessment Upper Extremity Assessment: Defer to OT evaluation    Lower Extremity Assessment Lower Extremity Assessment: Generalized weakness (pt able to tolerate ROM of bil hip and knee. )    Cervical / Trunk Assessment Cervical / Trunk Assessment: Kyphotic  Communication   Communication: Other (comment) (soft voice, hard to understand at times)  Cognition Arousal/Alertness: Lethargic Behavior During Therapy: WFL for tasks assessed/performed Overall Cognitive Status: No family/caregiver present to determine baseline cognitive functioning                 General Comments: slow motoric responses, oriented to self and situation, and date (not place)    General Comments      Exercises Ankle pumps, 10 reps, AAROM, supine Heel slides, 5 reps, AAROM, supine   Assessment/Plan  PT Assessment Patient needs continued PT services  PT Problem List Decreased strength;Decreased mobility;Decreased activity tolerance;Decreased balance;Pain;Decreased knowledge of use of DME          PT Treatment Interventions DME instruction;Therapeutic activities;Gait training;Therapeutic exercise;Patient/family education;Functional mobility training;Balance training    PT Goals (Current goals can be found in the Care Plan section)  Acute Rehab PT Goals Patient Stated Goal: none stated PT Goal Formulation: Patient unable to participate in goal setting Time For Goal Achievement: 05/23/16 Potential to Achieve Goals: Fair    Frequency Min 3X/week   Barriers to discharge        Co-evaluation               End of Session    Activity Tolerance: Patient limited by fatigue;Patient limited by pain Patient left: in bed;with call bell/phone within reach;with family/visitor present;with bed alarm set           Time: 0272-5366 PT Time Calculation (min) (ACUTE ONLY): 8 min   Charges:   PT Evaluation $PT Eval Low Complexity: 1 Procedure     PT G Codes:        Rebeca Alert, MPT Pager: [phone]

## 2016-05-03 DIAGNOSIS — D649 Anemia, unspecified: Secondary | ICD-10-CM

## 2016-05-03 DIAGNOSIS — R531 Weakness: Secondary | ICD-10-CM

## 2016-05-03 DIAGNOSIS — J029 Acute pharyngitis, unspecified: Secondary | ICD-10-CM

## 2016-05-03 LAB — CBC WITH DIFFERENTIAL/PLATELET
BASOS ABS: 0 10*3/uL (ref 0.0–0.1)
BASOS PCT: 0 %
EOS ABS: 0 10*3/uL (ref 0.0–0.7)
EOS PCT: 0 %
HCT: 25.9 % — ABNORMAL LOW (ref 36.0–46.0)
Hemoglobin: 8.8 g/dL — ABNORMAL LOW (ref 12.0–15.0)
Lymphocytes Relative: 17 %
Lymphs Abs: 1.4 10*3/uL (ref 0.7–4.0)
MCH: 32.5 pg (ref 26.0–34.0)
MCHC: 34 g/dL (ref 30.0–36.0)
MCV: 95.6 fL (ref 78.0–100.0)
Monocytes Absolute: 0.6 10*3/uL (ref 0.1–1.0)
Monocytes Relative: 7 %
Neutro Abs: 6.5 10*3/uL (ref 1.7–7.7)
Neutrophils Relative %: 76 %
PLATELETS: 103 10*3/uL — AB (ref 150–400)
RBC: 2.71 MIL/uL — AB (ref 3.87–5.11)
RDW: 15.2 % (ref 11.5–15.5)
WBC: 8.6 10*3/uL (ref 4.0–10.5)

## 2016-05-03 LAB — COMPREHENSIVE METABOLIC PANEL
ALT: 16 U/L (ref 14–54)
AST: 40 U/L (ref 15–41)
Albumin: 2.4 g/dL — ABNORMAL LOW (ref 3.5–5.0)
Alkaline Phosphatase: 31 U/L — ABNORMAL LOW (ref 38–126)
Anion gap: 9 (ref 5–15)
BUN: 17 mg/dL (ref 6–20)
CHLORIDE: 106 mmol/L (ref 101–111)
CO2: 23 mmol/L (ref 22–32)
CREATININE: 0.77 mg/dL (ref 0.44–1.00)
Calcium: 8.2 mg/dL — ABNORMAL LOW (ref 8.9–10.3)
GFR calc Af Amer: >60 mL/min (ref >60)
GFR calc non Af Amer: >60 mL/min (ref >60)
Glucose, Bld: 81 mg/dL (ref 65–99)
POTASSIUM: 3.7 mmol/L (ref 3.5–5.1)
SODIUM: 138 mmol/L (ref 135–145)
Total Bilirubin: 0.8 mg/dL (ref 0.3–1.2)
Total Protein: 4.5 g/dL — ABNORMAL LOW (ref 6.5–8.1)

## 2016-05-03 LAB — HEMOGLOBIN AND HEMATOCRIT, BLOOD
HCT: 25.5 % — ABNORMAL LOW (ref 36.0–46.0)
HCT: 26.6 % — ABNORMAL LOW (ref 36.0–46.0)
HEMATOCRIT: 24.8 % — AB (ref 36.0–46.0)
HEMOGLOBIN: 8.6 g/dL — AB (ref 12.0–15.0)
HEMOGLOBIN: 8.6 g/dL — AB (ref 12.0–15.0)
Hemoglobin: 8.8 g/dL — ABNORMAL LOW (ref 12.0–15.0)

## 2016-05-03 LAB — PHOSPHORUS: PHOSPHORUS: 2.5 mg/dL (ref 2.5–4.6)

## 2016-05-03 LAB — CULTURE, GROUP A STREP (THRC)

## 2016-05-03 LAB — ABO/RH: ABO/RH(D): O POS

## 2016-05-03 LAB — MAGNESIUM: Magnesium: 1.9 mg/dL (ref 1.7–2.4)

## 2016-05-03 NOTE — Progress Notes (Signed)
PROGRESS NOTE    Alexis Cobb  UMP:[id number] DOB: [birth year] DOA: 04/30/2016 PCP: Oliver Barre, MD   Brief Narrative:  Alexis Cobb is a 82 y.o. female with medical history significant of mitral valve repair, pacemaker placement, HTN, and HLD presenting after she slipped and fell in the bedroom.  She niece (an Charity fundraiser) had called her about 115 today.  She didn't want to go see doctor despite sounding horrible.  The patient had a scratchy throat Wednesday, but the niece didn't talk to her yesterday.  The patient usually gets hair done Friday, but didn't feel well enough today to get hair done.    The niece called her sister (a speech therapist), who went to see her; the patient wouldn't answer the door.  The patient reports that she slipped and fell; she pressed her life alert - the company notified the RN niece's husband and he called her.  She arrived at the same time as the ambulance.  The patient was between 2 twin beds, had on socks but no slippers on hardwood floors.  Unable to get up. The niece last spoke with her at 46, arrived at 8.   Family reports that the patient is very pale.  She has had diarrhea for the last 2 days, had stool incontinence when they found her and she has had 3 watery stools while in the ER.  The patient states that she has never had such a severe sore throat before.  No headache.  No cough, no fever.  Has not been feeling SOB. Admitted for Respiratory Failure, Generalized Weakness and Debility, and Diarrhea. Found to have Parainfluenza 1 Virus. Patient's Hb/Hct Acutely dropped today so will trend H/H's and Transfuse if necessary. No S/Sx of Bleeding.   Assessment & Plan:   Principal Problem:   Infection due to parainfluenza virus 1 Active Problems:   Essential hypertension   Acute respiratory failure with hypoxia (HCC)   Acute pharyngitis   Diarrhea   Parainfluenza virus pharyngitis  Generalized Weakness and Deconditioning from Parainfluenza Virus and  Diarrhea resulting in Fall -CT of Head showed No acute intracranial findings. There is moderate generalized atrophy and chronic appearing white matter hypodensities which likely represent small vessel ischemic disease. -Urinalysis was Negative -C/w IVF with LR at 75 mL/hr -PT and OT Recommend SNF -Teaching laboratory technician for Skilled Nursing Placement  Acute Respiratory Failure with Hypoxia suspect Aspiration PNA -Patient who is generally quite healthy (still drives, lives independently, etc) despite age of 82yo presenting with acute onset of debilitating illness -CXR was read as There is emphysematous changes of the lungs. No focal consolidation, pleural effusion, or pneumothorax. The cardiac silhouette is within normal limits. The aorta is mildly tortuous. Median sternotomy wires and left pectoral dual lead pacemaker device noted. There is osteopenia with degenerative changes of the spine. No acute fracture. -Has respiratory failure with hypoxia without apparent indication.  The concern is that this is an early/developing aspiration PNA, particularly in light of the severe sore throat. -This may all be attributable to a viral syndrome.  However, given her age alone her prognosis is guarded with an estimated APACHE II score of 12 and a predicted death rate of 14.6%. -NPO as Patient failed Swallow Study- Repeat SLP Assessment Pending -C/w Empiric Levofloxacin as (patient has a PCN/cephalosporin allergy) for now. -Rapid Strep Negative -Blood Cx x2 showed NGTD; Patient's WBC went from 7.8 -> 11.1 -> 8.6 -C/w -Magic mouthwash with lidocaine for swish and spit; Received Chlorhexidine 15  mL -Respiratory Panel Positive for Parainfluenza Virus 1 -Palliative Care Consulted for Goals of Care - Family Meeting tomorrow 05/04/16 at 11:00 AM  Acute Anemia -Hb/Hct dropped to 8.8/25.9 from 11.8/25.9 -No Bloody Bowel Movements or Hematemesis/Coffee Ground Emesis -? Dilutional -Check FOBT; Type and Screen,  Hb/Hct q6h -If Drops further will Transfuse   Loose Diarrhea -Has not had recurrent episodes today -Will check for C. Difficile but no more episodes now  HTN -C/w Metoprolol Succinate 75 mg po Daily and with Losartan 100 mg po Daily.  Hyperlipidemia -C/w Pravastatin 40 mg sq daily   Hypokalemia, improved -Patient's K+ was 3.1 and went to 3.7 -Replete via IV as patient is NPO -Repeat CMP in AM  DVT prophylaxis: Lovenox 40 mg sq Code Status: FULL CODE Family Communication: No Family present at bedside Disposition Plan: SNF at D/C  Consultants:   Palliative Care   Procedures: Swallow Screen   Antimicrobials: IV Levofloxacin IV q48h for CAP 04/30/16 -->  Subjective: Seen and examined at bedside and looked slightly more awake but still pretty weak. No N/V/Abdominal Pain. No SOB. States she has not had a bowel movement any more. No other complaints or concerns.   Objective: Vitals:   05/02/16 1426 05/02/16 2142 05/03/16 0520 05/03/16 1529  BP: 114/60 114/60 (!) 112/54 (!) 119/54  Pulse: 65 82 93 78  Resp: 18 19 17 16   Temp: 97.8 F (36.6 C) 98.9 F (37.2 C) 98.3 F (36.8 C) 98.1 F (36.7 C)  TempSrc: Axillary Axillary Axillary Oral  SpO2: 99% 96% 99% 100%  Weight:      Height:        Intake/Output Summary (Last 24 hours) at 05/03/16 2005 Last data filed at 05/03/16 0300  Gross per 24 hour  Intake              850 ml  Output              275 ml  Net              575 ml   Filed Weights   05/01/16 0055 05/01/16 0122  Weight: 46.7 kg (103 lb) 46.7 kg (103 lb)    Examination: Physical Exam:  Constitutional: Thin Elderly ill appearing female laying in bed frail and weak Eyes: Llids and conjunctivae normal, sclerae anicteric  ENMT: External Ears, Nose appear normal. Grossly normal hearing.  Neck: Appears normal, supple, no cervical masses, normal ROM, no appreciable thyromegaly, no JVD Respiratory: Diminished to auscultation bilaterally, no wheezing, rales,  rhonchi or crackles. Normal respiratory effort and patient is not tachypenic. No accessory muscle use. Mild cough.  Cardiovascular: RRR, 2/6 Systolic Murmur. No rubs / gallops. S1 and S2 auscultated. Mild LE extremity edema.  Abdomen: Soft, non-tender, non-distended. No masses palpated. No appreciable hepatosplenomegaly. Bowel sounds positive x4.  GU: Deferred. Musculoskeletal: No clubbing / cyanosis of digits/nails. No joint deformity upper and lower extremities.  Skin: Ecchymosis noted on arms.  Neurologic: CN 2-12 grossly intact with no focal deficits. Sensation intact in all 4 Extremities. Romberg sign cerebellar reflexes not assessed.  Psychiatric: Normal judgment and insight. Alert and oriented x 3. Normal mood and appropriate affect.   Data Reviewed: I have personally reviewed following labs and imaging studies  CBC:  Recent Labs Lab 04/30/16 2027 05/01/16 0219 05/02/16 0551 05/03/16 0600 05/03/16 0943 05/03/16 1456  WBC 6.4 7.8 11.1* 8.6  --   --   NEUTROABS 4.8  --  8.8* 6.5  --   --  HGB 13.5 13.0 11.8* 8.8* 8.6* 8.6*  HCT 41.1 39.2 34.5* 25.9* 25.5* 24.8*  MCV 97.9 97.3 95.3 95.6  --   --   PLT 147* 126* 127* 103*  --   --    Basic Metabolic Panel:  Recent Labs Lab 04/30/16 2027 05/01/16 0219 05/02/16 0551 05/03/16 0600  NA 136 135 136 138  K 3.9 3.5 3.1* 3.7  CL 103 102 103 106  CO2 25 24 26 23   GLUCOSE 113* 158* 108* 81  BUN 12 13 13 17   CREATININE 0.81 0.77 0.67 0.77  CALCIUM 9.0 8.4* 8.4* 8.2*  MG  --   --  1.6* 1.9  PHOS  --   --  2.7 2.5   GFR: Estimated Creatinine Clearance: 35.1 mL/min (by C-G formula based on SCr of 0.77 mg/dL). Liver Function Tests:  Recent Labs Lab 04/30/16 2027 05/02/16 0551 05/03/16 0600  AST 39 49* 40  ALT 23 20 16   ALKPHOS 54 41 31*  BILITOT 0.8 0.8 0.8  PROT 6.3* 5.3* 4.5*  ALBUMIN 3.6 3.1* 2.4*   No results for input(s): LIPASE, AMYLASE in the last 168 hours. No results for input(s): AMMONIA in the last 168  hours. Coagulation Profile: No results for input(s): INR, PROTIME in the last 168 hours. Cardiac Enzymes: No results for input(s): CKTOTAL, CKMB, CKMBINDEX, TROPONINI in the last 168 hours. BNP (last 3 results) No results for input(s): PROBNP in the last 8760 hours. HbA1C: No results for input(s): HGBA1C in the last 72 hours. CBG: No results for input(s): GLUCAP in the last 168 hours. Lipid Profile: No results for input(s): CHOL, HDL, LDLCALC, TRIG, CHOLHDL, LDLDIRECT in the last 72 hours. Thyroid Function Tests: No results for input(s): TSH, T4TOTAL, FREET4, T3FREE, THYROIDAB in the last 72 hours. Anemia Panel: No results for input(s): VITAMINB12, FOLATE, FERRITIN, TIBC, IRON, RETICCTPCT in the last 72 hours. Sepsis Labs: No results for input(s): PROCALCITON, LATICACIDVEN in the last 168 hours.  Recent Results (from the past 240 hour(s))  Rapid strep screen (not at [facility])     Status: None   Collection Time: 05/01/16  1:52 AM  Result Value Ref Range Status   Streptococcus, Group A Screen (Direct) NEGATIVE NEGATIVE Final    Comment: (NOTE) A Rapid Antigen test may result negative if the antigen level in the sample is below the detection level of this test. The FDA has not cleared this test as a stand-alone test therefore the rapid antigen negative result has reflexed to a Group A Strep culture.   Culture, group A strep     Status: None   Collection Time: 05/01/16  1:52 AM  Result Value Ref Range Status   Specimen Description THROAT  Final   Special Requests NONE Reflexed from [facility]  Final   Culture   Final    NO GROUP A STREP (S.PYOGENES) ISOLATED Performed at [facility]    Report Status 05/03/2016 FINAL  Final  Culture, blood (routine x 2) Call MD if unable to obtain prior to antibiotics being given     Status: None (Preliminary result)   Collection Time: 05/01/16  2:19 AM  Result Value Ref Range Status   Specimen Description BLOOD RIGHT ARM  Final   Special  Requests BOTTLES DRAWN AEROBIC ONLY 7 CC  Final   Culture   Final    NO GROWTH 2 DAYS Performed at [facility]    Report Status PENDING  Incomplete  Culture, blood (routine x 2) Call MD  if unable to obtain prior to antibiotics being given     Status: None (Preliminary result)   Collection Time: 05/01/16  2:19 AM  Result Value Ref Range Status   Specimen Description BLOOD RIGHT WRIST  Final   Special Requests BOTTLES DRAWN AEROBIC AND ANAEROBIC 5 CC  Final   Culture   Final    NO GROWTH 2 DAYS Performed at [facility]    Report Status PENDING  Incomplete  Respiratory Panel by PCR     Status: Abnormal   Collection Time: 05/01/16  4:04 PM  Result Value Ref Range Status   Adenovirus NOT DETECTED NOT DETECTED Final   Coronavirus 229E NOT DETECTED NOT DETECTED Final   Coronavirus HKU1 NOT DETECTED NOT DETECTED Final   Coronavirus NL63 NOT DETECTED NOT DETECTED Final   Coronavirus OC43 NOT DETECTED NOT DETECTED Final   Metapneumovirus NOT DETECTED NOT DETECTED Final   Rhinovirus / Enterovirus NOT DETECTED NOT DETECTED Final   Influenza A NOT DETECTED NOT DETECTED Final   Influenza B NOT DETECTED NOT DETECTED Final   Parainfluenza Virus 1 DETECTED (A) NOT DETECTED Final   Parainfluenza Virus 2 NOT DETECTED NOT DETECTED Final   Parainfluenza Virus 3 NOT DETECTED NOT DETECTED Final   Parainfluenza Virus 4 NOT DETECTED NOT DETECTED Final   Respiratory Syncytial Virus NOT DETECTED NOT DETECTED Final   Bordetella pertussis NOT DETECTED NOT DETECTED Final   Chlamydophila pneumoniae NOT DETECTED NOT DETECTED Final   Mycoplasma pneumoniae NOT DETECTED NOT DETECTED Final    Comment: Performed at [facility]    Radiology Studies: No results found. Scheduled Meds: . aspirin EC  81 mg Oral QHS  . levofloxacin (LEVAQUIN) IV  750 mg Intravenous Q48H  . losartan  100 mg Oral Daily  . metoprolol succinate  75 mg Oral Daily  . pravastatin  40 mg Oral q1800   Continuous  Infusions: . lactated ringers 75 mL/hr at 05/03/16 1445    LOS: 2 days   Merlene Laughter, DO [facility] Pager [phone]  If 7PM-7AM, please contact night-coverage [email or web address] Password [facility] 05/03/2016, 8:05 PM

## 2016-05-03 NOTE — Consult Note (Signed)
Consultation Note Date: 05/03/2016   Patient Name: Alexis Cobb  DOB: 1924/09/14  MRN: [id number]  Age / Sex: 82 y.o., female  PCP: Biagio Borg, MD Referring Physician: Kerney Elbe, DO  Reason for Consultation: Establishing goals of care  HPI/Patient Profile: 82 y.o. female  with past medical history of  Mitral valve repair, PPM, HTN, HLD admitted on 04/30/2016 with fall, respiratory failure, generalized weakness, debility, diarrhea, also found to have parainfluenza 1.   Clinical Assessment and Goals of Care: Patient remains admitted with weakness, deconditioning, para influenza virus infection, ongoing diarrhea. I met with the patient, introduced myself and palliative care as follows: Palliative medicine is specialized medical care for people living with serious illness. It focuses on providing relief from the symptoms and stress of a serious illness. The goal is to improve quality of life for both the patient and the family.  Patient complains of pain in her neck. She states she doesn't know if she is getting better, but she certainly hopes she will get better. There is no family at the bedside, call placed and discussed with niece Asencion Noble: niece states that the patient never married, has no kids, she has several nieces and nephews, there are papers made such as advance directives: living will, HCPOA paperwork, Maudie Mercury is trying to find them. She does believe that the patient would want to undergo any and all efforts to prolong her life. Hence, will continue full code for now.   Briefly discussed with niece regarding the patient's current condition and the scope of this hospitalization. See recommendations below, thank you for the consult.   NEXT OF KIN  has several nieces and nephews, no spouse, no kids.   SUMMARY OF RECOMMENDATIONS    1. Full code for now, monitor disease trajectory, as per  discussions with niece Asencion Noble over the phone at [phone].  2. Patient has completed a living will and advanced directives, niece will try to find them and bring them to the hospital.  3. Family meeting with all concerned parties, nieces and nephews along with palliative provider scheduled for 05-04-16 at 11 am. Further recommendations will follow.   Code Status/Advance Care Planning:  Full code    Symptom Management:     As above  Palliative Prophylaxis:   Delirium Protocol  Additional Recommendations (Limitations, Scope, Preferences):  Full Scope Treatment  Psycho-social/Spiritual:   Desire for further Chaplaincy support:no  Additional Recommendations: Caregiving  Support/Resources  Prognosis:   Unable to determine  Discharge Planning: [place] for rehab with Palliative care service follow-up      Primary Diagnoses: Present on Admission: . (Resolved) Hypoxemia . Acute respiratory failure with hypoxia ([place]) . Essential hypertension . Acute pharyngitis . Diarrhea   I have reviewed the medical record, interviewed the patient and family, and examined the patient. The following aspects are pertinent.  Past Medical History:  Diagnosis Date  . Arthritis   . ARTHRITIS, RHEUMATOID, HX OF 01/06/2007  . Cervical stenosis of spine   .  HTN (hypertension)   . Hyperlipidemia   . MITRAL REGURGITATION 01/06/2007  . OSTEOARTHRITIS 01/14/2007  . OSTEOPOROSIS 01/14/2007  . Other specified forms of hearing loss 08/01/2009  . PACEMAKER, PERMANENT 01/06/2007  . Raynaud's syndrome 01/06/2007   Social History   Social History  . Marital status: Widowed    Spouse name: N/A  . Number of children: N/A  . Years of education: N/A   Occupational History  . retired Radiation protection practitioner Retired   Social History Main Topics  . Smoking status: Never Smoker  . Smokeless tobacco: Never Used  . Alcohol use No  . Drug use: No  . Sexual activity: Not Asked   Other Topics  Concern  . None   Social History Narrative  . None   Family History  Problem Relation Age of Onset  . Coronary artery disease    . Hypertension     Scheduled Meds: . aspirin EC  81 mg Oral QHS  . levofloxacin (LEVAQUIN) IV  750 mg Intravenous Q48H  . losartan  100 mg Oral Daily  . metoprolol succinate  75 mg Oral Daily  . pravastatin  40 mg Oral q1800   Continuous Infusions: . lactated ringers 75 mL/hr at 05/03/16 0300   PRN Meds:.acetaminophen **OR** acetaminophen, LORazepam, magic mouthwash w/lidocaine, morphine injection, ondansetron **OR** ondansetron (ZOFRAN) IV Medications Prior to Admission:  Prior to Admission medications   Medication Sig Start Date End Date Taking? Authorizing Provider  aspirin EC 81 MG tablet Take 81 mg by mouth at bedtime.   Yes Historical Provider, MD  Calcium Carbonate-Vitamin D (CALCIUM 600+D) 600-400 MG-UNIT tablet Take 1 tablet by mouth 2 (two) times daily.   Yes Historical Provider, MD  cholecalciferol (VITAMIN D) 1000 units tablet Take 1,000 Units by mouth daily.   Yes Historical Provider, MD  furosemide (LASIX) 20 MG tablet Take 20 mg by mouth daily as needed for edema.   Yes Historical Provider, MD  losartan (COZAAR) 100 MG tablet Take 100 mg by mouth daily.   Yes Historical Provider, MD  lovastatin (MEVACOR) 40 MG tablet Take 40 mg by mouth at bedtime.   Yes Historical Provider, MD  metoprolol succinate (TOPROL-XL) 50 MG 24 hr tablet Take 75 mg by mouth daily. Take with or immediately following a meal.   Yes Historical Provider, MD  Multiple Vitamin (MULTIVITAMIN WITH MINERALS) TABS tablet Take 1 tablet by mouth daily.   Yes Historical Provider, MD  nitroGLYCERIN (NITROSTAT) 0.4 MG SL tablet Place 0.4 mg under the tongue every 5 (five) minutes as needed for chest pain.   Yes Historical Provider, MD   Allergies  Allergen Reactions  . Ace Inhibitors Rash  . Cephalexin Rash  . Penicillins Rash and Other (See Comments)    Has patient had a PCN  reaction causing immediate rash, facial/tongue/throat swelling, SOB or lightheadedness with hypotension: No Has patient had a PCN reaction causing severe rash involving mucus membranes or skin necrosis: No Has patient had a PCN reaction that required hospitalization No Has patient had a PCN reaction occurring within the last 10 years: No If all of the above answers are "NO", then may proceed with Cephalosporin use.  Marland Kitchen Risedronate Sodium Rash   Review of Systems +neck pain  Physical Exam Weak frail lady Mild distress due to pain in neck and ongoing diarrhea S1 S2 Clear Abdomen soft No edema Awake alert Has neck soft collar  Vital Signs: BP (!) 112/54 (BP Location: Right Arm)   Pulse 93   Temp  98.3 F (36.8 C) (Axillary)   Resp 17   Ht 5' 3"  (1.6 m)   Wt 46.7 kg (103 lb)   SpO2 99%   BMI 18.25 kg/m  Pain Assessment: PAINAD   Pain Score: Asleep   SpO2: SpO2: 99 % O2 Device:SpO2: 99 % O2 Flow Rate: .O2 Flow Rate (L/min): 3 L/min  IO: Intake/output summary:  Intake/Output Summary (Last 24 hours) at 05/03/16 1128 Last data filed at 05/03/16 0300  Gross per 24 hour  Intake             1885 ml  Output              275 ml  Net             1610 ml    LBM: Last BM Date: 05/02/16 Baseline Weight: Weight: 46.7 kg (103 lb) Most recent weight: Weight: 46.7 kg (103 lb)     Palliative Assessment/Data:   Flowsheet Rows   Flowsheet Row Most Recent Value  Intake Tab  Referral Department  Hospitalist  Unit at Time of Referral  Med/Surg Unit  Palliative Care Primary Diagnosis  Other (Comment) [parainfluenza, fall, weakness. ]  Palliative Care Type  New Palliative care  Reason for referral  Clarify Goals of Care  Date first seen by Palliative Care  05/03/16  Clinical Assessment  Palliative Performance Scale Score  30%  Pain Max last 24 hours  4  Pain Min Last 24 hours  3  Dyspnea Max Last 24 Hours  4  Dyspnea Min Last 24 hours  3  Psychosocial & Spiritual Assessment    Palliative Care Outcomes  Patient/Family meeting held?  Yes  Who was at the meeting?  patient, niece Asencion Noble at [phone]   Palliative Care Outcomes  Clarified goals of care      Time In:  10 Time Out: 11 Time Total: 60 min  Greater than 50%  of this time was spent counseling and coordinating care related to the above assessment and plan.  Signed by: Loistine Chance, MD  [phone]  Please contact Palliative Medicine Team phone at [phone] for questions and concerns.  For individual provider: See Shea Evans

## 2016-05-04 ENCOUNTER — Inpatient Hospital Stay (HOSPITAL_COMMUNITY): Payer: Medicare Other

## 2016-05-04 DIAGNOSIS — D62 Acute posthemorrhagic anemia: Secondary | ICD-10-CM

## 2016-05-04 DIAGNOSIS — R710 Precipitous drop in hematocrit: Secondary | ICD-10-CM

## 2016-05-04 DIAGNOSIS — R0602 Shortness of breath: Secondary | ICD-10-CM

## 2016-05-04 DIAGNOSIS — J690 Pneumonitis due to inhalation of food and vomit: Secondary | ICD-10-CM

## 2016-05-04 LAB — COMPREHENSIVE METABOLIC PANEL
ALBUMIN: 2.5 g/dL — AB (ref 3.5–5.0)
ALK PHOS: 35 U/L — AB (ref 38–126)
ALT: 19 U/L (ref 14–54)
ANION GAP: 10 (ref 5–15)
AST: 40 U/L (ref 15–41)
BILIRUBIN TOTAL: 1.4 mg/dL — AB (ref 0.3–1.2)
BUN: 21 mg/dL — ABNORMAL HIGH (ref 6–20)
CALCIUM: 8.6 mg/dL — AB (ref 8.9–10.3)
CO2: 21 mmol/L — AB (ref 22–32)
CREATININE: 0.72 mg/dL (ref 0.44–1.00)
Chloride: 110 mmol/L (ref 101–111)
GFR calc non Af Amer: >60 mL/min (ref >60)
GLUCOSE: 77 mg/dL (ref 65–99)
Potassium: 3.7 mmol/L (ref 3.5–5.1)
SODIUM: 141 mmol/L (ref 135–145)
TOTAL PROTEIN: 4.6 g/dL — AB (ref 6.5–8.1)

## 2016-05-04 LAB — CBC WITH DIFFERENTIAL/PLATELET
BASOS ABS: 0 10*3/uL (ref 0.0–0.1)
Basophils Relative: 0 %
EOS PCT: 0 %
Eosinophils Absolute: 0 10*3/uL (ref 0.0–0.7)
HEMATOCRIT: 23.5 % — AB (ref 36.0–46.0)
HEMOGLOBIN: 8 g/dL — AB (ref 12.0–15.0)
LYMPHS ABS: 1.2 10*3/uL (ref 0.7–4.0)
LYMPHS PCT: 14 %
MCH: 32.8 pg (ref 26.0–34.0)
MCHC: 34 g/dL (ref 30.0–36.0)
MCV: 96.3 fL (ref 78.0–100.0)
MONOS PCT: 10 %
Monocytes Absolute: 0.9 10*3/uL (ref 0.1–1.0)
NEUTROS ABS: 6.4 10*3/uL (ref 1.7–7.7)
Neutrophils Relative %: 76 %
Platelets: 123 10*3/uL — ABNORMAL LOW (ref 150–400)
RBC: 2.44 MIL/uL — ABNORMAL LOW (ref 3.87–5.11)
RDW: 15.1 % (ref 11.5–15.5)
WBC: 8.5 10*3/uL (ref 4.0–10.5)

## 2016-05-04 LAB — MAGNESIUM: Magnesium: 1.9 mg/dL (ref 1.7–2.4)

## 2016-05-04 LAB — HEMOGLOBIN AND HEMATOCRIT, BLOOD
HCT: 22.9 % — ABNORMAL LOW (ref 36.0–46.0)
Hemoglobin: 7.8 g/dL — ABNORMAL LOW (ref 12.0–15.0)

## 2016-05-04 LAB — PREPARE RBC (CROSSMATCH)

## 2016-05-04 LAB — PHOSPHORUS: Phosphorus: 2.8 mg/dL (ref 2.5–4.6)

## 2016-05-04 MED ORDER — STARCH (THICKENING) PO POWD
ORAL | Status: DC | PRN
  Filled 2016-05-04: qty 227

## 2016-05-04 MED ORDER — SODIUM CHLORIDE 0.9 % IV SOLN: Freq: Once | INTRAVENOUS | Status: DC

## 2016-05-04 MED ORDER — LIP MEDEX EX OINT
TOPICAL_OINTMENT | CUTANEOUS | Status: AC
  Administered 2016-05-04: 1
  Filled 2016-05-04: qty 7

## 2016-05-04 MED ORDER — CHLORHEXIDINE GLUCONATE 0.12 % MT SOLN
15.0000 mL | Freq: Two times a day (BID) | OROMUCOSAL | Status: DC
  Administered 2016-05-04 – 2016-05-07 (×7): 15 mL via OROMUCOSAL
  Filled 2016-05-04 (×6): qty 15

## 2016-05-04 MED ORDER — FUROSEMIDE 10 MG/ML IJ SOLN
20.0000 mg | Freq: Once | INTRAMUSCULAR | Status: AC
  Administered 2016-05-04: 20 mg via INTRAVENOUS
  Filled 2016-05-04: qty 2

## 2016-05-04 NOTE — Progress Notes (Signed)
Pt. Having difficulty voiding abdomen distended bladder scanned for . Pt had order for foley placement for urinary retention, foley placed per orders pt. Tolerated well. Foley draining amber urine will continue to monitor.

## 2016-05-04 NOTE — Progress Notes (Addendum)
Daily Progress Note   Patient Name: Alexis Cobb       Date: 05/04/2016 DOB: 1925-05-08  Age: 82 y.o. MRN#: [id number] Attending Physician: Kerney Elbe, DO Primary Care Physician: Cathlean Cower, MD Admit Date: 04/30/2016  Reason for Consultation/Follow-up: Establishing goals of care  Subjective: Awake alert Resting in bed, family at bedside, see discussions below:   Length of Stay: 3  Current Medications: Scheduled Meds:  . sodium chloride   Intravenous Once  . aspirin EC  81 mg Oral QHS  . furosemide  20 mg Intravenous Once  . levofloxacin (LEVAQUIN) IV  750 mg Intravenous Q48H  . losartan  100 mg Oral Daily  . metoprolol succinate  75 mg Oral Daily  . pravastatin  40 mg Oral q1800    Continuous Infusions: . lactated ringers 75 mL/hr at 05/03/16 1445    PRN Meds: acetaminophen **OR** acetaminophen, LORazepam, magic mouthwash w/lidocaine, morphine injection, ondansetron **OR** ondansetron (ZOFRAN) IV  Physical Exam         Weak frail lady S1 S2 Abdomen soft No edema Clear Diminished bases  Vital Signs: BP 123/61   Pulse 94   Temp 97.6 F (36.4 C) (Oral)   Resp 18   Ht _0  (1.6 m)   Wt 46.7 kg (103 lb)   SpO2 100%   BMI 18.25 kg/m  SpO2: SpO2: 100 % O2 Device: O2 Device: Nasal Cannula O2 Flow Rate: O2 Flow Rate (L/min): 3 L/min  Intake/output summary:  Intake/Output Summary (Last 24 hours) at 05/04/16 1204 Last data filed at 05/04/16 1159  Gross per 24 hour  Intake              250 ml  Output              700 ml  Net             -450 ml   LBM: Last BM Date: 05/03/16 Baseline Weight: Weight: 46.7 kg (103 lb) Most recent weight: Weight: 46.7 kg (103 lb)       Palliative Assessment/Data:    Flowsheet Rows   Flowsheet Row Most Recent  Value  Intake Tab  Referral Department  Hospitalist  Unit at Time of Referral  Med/Surg Unit  Palliative Care Primary Diagnosis  Other (Comment) [parainfluenza, fall, weakness. ]  Palliative  Care Type  New Palliative care  Reason for referral  Clarify Goals of Care  Date first seen by Palliative Care  05/03/16  Clinical Assessment  Palliative Performance Scale Score  30%  Pain Max last 24 hours  5  Pain Min Last 24 hours  4  Dyspnea Max Last 24 Hours  4  Dyspnea Min Last 24 hours  3  Psychosocial & Spiritual Assessment  Palliative Care Outcomes  Patient/Family meeting held?  Yes  Who was at the meeting?  patient, niece Alexis Cobb at [phone]   Palliative Care Outcomes  Clarified goals of care      Patient Active Problem List   Diagnosis Date Noted  . Sore throat   . Anemia   . Parainfluenza virus pharyngitis 05/02/2016  . Infection due to parainfluenza virus 1 05/02/2016  . Acute respiratory failure with hypoxia ([place]) 05/01/2016  . Acute pharyngitis 05/01/2016  . Diarrhea 05/01/2016  . Rash 05/31/2015  . Hives 12/10/2014  . Chronic venous insufficiency 12/10/2014  . Peripheral neuropathy ([place]) 12/12/2013  . Right shoulder pain 07/31/2012  . Anxiety state 07/31/2012  . Sinoatrial node dysfunction ([place]) 11/23/2011  . Polyarthropathy 12/31/2010  . Benign head tremor 12/31/2010  . Cervical radicular pain 12/31/2010  . Weakness 12/31/2010  . Preventative health care 11/27/2010  . OSTEOARTHRITIS 01/14/2007  . OSTEOPOROSIS 01/14/2007  . Hyperlipidemia 01/06/2007  . MITRAL REGURGITATION 01/06/2007  . Essential hypertension 01/06/2007  . Raynaud's syndrome 01/06/2007  . PACEMAKER, PERMANENT 01/06/2007    Palliative Care Assessment & Plan   Patient Profile:    Assessment:  low impact fall, patient states she slid out of bed Para in fluenza infection HTN Has PPM Diarrhea Failed swallow eval 12-26: CT abdomen pelvis with multiple pelvic fractures, hematoma Low  Hgb.   Recommendations/Plan:   Agree with current management, PRBC, IVF, Abx.   Family meeting:  I met with the patient and all concerned parties. We discussed about the patient's current condition. Niece Alexis Cobb is an Therapist, sports. Later on, I came back and also reviewed the CT abdomen and pelvis results in detail, all questions answered to the best of my ability.   Advanced directives discussions: patient has a copy of her living will, power of attorney and medical power of attorney at her place. She has given permission for niece Alexis Cobb to go to her place, and give [place] the documentation of her wishes.   Code status discussions undertaken in great detail, difference between DNR DNI versus full code was discussed completely. Patient understands well, she wishes to continue with full code for now, she wishes to think more about it later.   Recent findings from today's abdominal imaging discussed in detail. Patient has multiple pelvic fractures, bilateral pleural effusions, muscle hemorrhage, ?cystitis, ? Proctitis/ distal colitis. Reviewed in detail with patient and niece Alexis Mercury RN that these are serious findings, largely non operative. Best management is supportive care, PRBC, IVF which we are already doing. Discussed about how patient might not be able to participate fully in rehab given the CT findings.   Speech therapy re eval requested.   We will need to continue goals of care conversations with patient and family.    Code Status:    Code Status Orders        Start     Ordered   05/01/16 0129  Full code  Continuous     05/01/16 0128    Code Status History    Date Active Date Inactive  Code Status Order ID Comments User Context   This patient has a current code status but no historical code status.       Prognosis:   guarded  Discharge Planning:  [place] for rehab with Palliative care service follow-up  Care plan was discussed with  Patient, niece Alexis Cobb, niece Alexis Cobb, nephew  and several other family members present at the bedside.   Thank you for allowing the Palliative Medicine Team to assist in the care of this patient.   Time In: 11 Time Out: 12 Total Time 60 Prolonged Time Billed  no       Greater than 50%  of this time was spent counseling and coordinating care related to the above assessment and plan.  Loistine Chance, MD [phone]  Please contact Palliative Medicine Team phone at [phone] for questions and concerns.

## 2016-05-04 NOTE — [place] FL2 (Addendum)
[place] MEDICAID FL2 LEVEL OF CARE SCREENING TOOL     IDENTIFICATION  Patient Name: Alexis Cobb Birthdate: 05/31/1924 Sex: female Admission Date (Current Location): 04/30/2016  [facility] and [place] Number:  Producer, television/film/video and Address:  [facility],  [address] [place], [place] [address]      Provider Number: [id number]  Attending Physician Name and Address:  Merlene Laughter, DO  Relative Name and Phone Number:       Current Level of Care: SNF Recommended Level of Care: Skilled Nursing Facility Prior Approval Number:    Date Approved/Denied:   PASRR Number:   [id number]   Discharge Plan: SNF    Current Diagnoses: Patient Active Problem List   Diagnosis Date Noted  . Sore throat   . Anemia   . Parainfluenza virus pharyngitis 05/02/2016  . Infection due to parainfluenza virus 1 05/02/2016  . Acute respiratory failure with hypoxia ([facility]) 05/01/2016  . Acute pharyngitis 05/01/2016  . Diarrhea 05/01/2016  . Rash 05/31/2015  . Hives 12/10/2014  . Chronic venous insufficiency 12/10/2014  . Peripheral neuropathy ([facility]) 12/12/2013  . Right shoulder pain 07/31/2012  . Anxiety state 07/31/2012  . Sinoatrial node dysfunction ([facility]) 11/23/2011  . Polyarthropathy 12/31/2010  . Benign head tremor 12/31/2010  . Cervical radicular pain 12/31/2010  . Weakness 12/31/2010  . Preventative health care 11/27/2010  . OSTEOARTHRITIS 01/14/2007  . OSTEOPOROSIS 01/14/2007  . Hyperlipidemia 01/06/2007  . MITRAL REGURGITATION 01/06/2007  . Essential hypertension 01/06/2007  . Raynaud's syndrome 01/06/2007  . PACEMAKER, PERMANENT 01/06/2007    Orientation RESPIRATION BLADDER Height & Weight     Self, Place, Situation  Normal Indwelling catheter Weight: 103 lb (46.7 kg) Height:  5\' 3"  (160 cm)  BEHAVIORAL SYMPTOMS/MOOD NEUROLOGICAL BOWEL NUTRITION STATUS      Continent Diet (See discharge summary)  AMBULATORY STATUS COMMUNICATION OF NEEDS Skin    Extensive Assist Verbally Normal                       Personal Care Assistance Level of Assistance  Bathing, Feeding, Dressing Bathing Assistance: Limited assistance Feeding assistance: Independent Dressing Assistance: Limited assistance     Functional Limitations Info  Sight, Hearing, Speech Sight Info: Adequate Hearing Info: Adequate Speech Info: Adequate    SPECIAL CARE FACTORS FREQUENCY  PT (By licensed PT), OT (By licensed OT), Speech therapy     PT Frequency: 5 OT Frequency: 5            Contractures Contractures Info: Not present    Additional Factors Info  Code Status, Allergies, Isolation Precautions Code Status Info: Fullcode Allergies Info: Ace Inhibitors, Cephalexin, Penicillins, Risedronate Sodium     Isolation Precautions Info: Drolet Precautions     Current Medications (05/04/2016):  This is the current hospital active medication list Current Facility-Administered Medications  Medication Dose Route Frequency Provider Last Rate Last Dose  . 0.9 %  sodium chloride infusion   Intravenous Once 05/06/2016, DO      . acetaminophen (TYLENOL) tablet 650 mg  650 mg Oral Q6H PRN [employer], MD   650 mg at 05/01/16 0148   Or  . acetaminophen (TYLENOL) suppository 650 mg  650 mg Rectal Q6H PRN 05/03/16, MD      . aspirin EC tablet 81 mg  81 mg Oral QHS Jonah Blue, MD   81 mg at 05/01/16 0148  . furosemide (LASIX) injection 20 mg  20 mg Intravenous Once 05/03/16  Sheikh, DO      . lactated ringers infusion   Intravenous Continuous Heloise Beecham Sheikh, DO 75 mL/hr at 05/03/16 1445    . levofloxacin (LEVAQUIN) IVPB 750 mg  750 mg Intravenous Q48H Randall K Absher, RPH   750 mg at 05/02/16 1730  . LORazepam (ATIVAN) injection 0.5 mg  0.5 mg Intravenous Q6H PRN Heloise Beecham Sheikh, DO   0.5 mg at 05/01/16 2259  . losartan (COZAAR) tablet 100 mg  100 mg Oral Daily Jonah Blue, MD   100 mg at 05/01/16 1117  . magic mouthwash w/lidocaine   5 mL Oral TID PRN Jonah Blue, MD      . metoprolol succinate (TOPROL-XL) 24 hr tablet 75 mg  75 mg Oral Daily Jonah Blue, MD   75 mg at 05/01/16 1116  . morphine 2 MG/ML injection 1 mg  1 mg Intravenous Q4H PRN Heloise Beecham Sheikh, DO   1 mg at 05/01/16 1816  . ondansetron (ZOFRAN) tablet 4 mg  4 mg Oral Q6H PRN Jonah Blue, MD       Or  . ondansetron [facility]) injection 4 mg  4 mg Intravenous Q6H PRN Jonah Blue, MD      . pravastatin (PRAVACHOL) tablet 40 mg  40 mg Oral q1800 Jonah Blue, MD         Discharge Medications: Please see discharge summary for a list of discharge medications.  Relevant Imaging Results:  Relevant Lab Results:   Additional Information SSN:[id number]  Clearance Coots, LCSW

## 2016-05-04 NOTE — Progress Notes (Signed)
Initial Nutrition Assessment  DOCUMENTATION CODES:   Severe malnutrition in context of chronic illness, Underweight  INTERVENTION:  Encouraged adequate intake of calories and protein through meals.  Provide Mightyshake II BID with lunch and dinner trays, each supplement provides 500 kcal and 23 grams of protein. Can decrease to one per day as intake improves.  NUTRITION DIAGNOSIS:   Malnutrition (Severe) related to chronic illness as evidenced by severe depletion of muscle mass, severe depletion of body fat.  GOAL:   Patient will meet greater than or equal to 90% of their needs  MONITOR:   PO intake, Supplement acceptance, Labs, Weight trends, I & O's  REASON FOR ASSESSMENT:   Malnutrition Screening Tool    ASSESSMENT:   82 y.o.femalewith medical history significant of mitral valve repair, pacemaker placement, HTN, and HLD presenting after she slipped and fell in the bedroom. Patient found to have generalized weakness and deconditioning from Parainfluenza Virus and Diarrhea, Acute Respiratory Failure with Hypoxia suspect Aspiration PNA.   -Patient advanced to Dysphagia 1 diet with Nectar-thick liquids after SLP evaluation today.  Spoke with patient and family member at bedside. Patient reports her appetite is "alright" now and she feels like she can eat. She reports the difficulty swallowing is new and she has never had any issues in the past. Reports two episodes of diarrhea PTA. Denies N/V or abdominal pain. Patient reports she has not had anything to eat since Friday (12/22). She typically has 2-3 small meals per day. For breakfast she may have cereal in milk with orange juice or eggs, and for her other meals she enjoys soup, bread, and casseroles. Patient follows low sodium/heart healthy diet at home.   Reports UBW of 114 lbs and that patient has been losing weight. Per chart patient has lost 11 lbs (10% body weight) over 1 year, which is not significant for time  frame.  Medications reviewed and include: Lasix 20 mg once today, LR @ 75 ml/hr.  Labs reviewed: CO2 21, BUN 21.  Nutrition-Focused physical exam completed. Findings are moderate-severe fat depletion, moderate-severe muscle depletion, and no edema.   Discussed with RN.  Diet Order:  DIET - DYS 1 Room service appropriate? Yes; Fluid consistency: Nectar Thick  Skin:  Reviewed, no issues  Last BM:  05/03/2016  Height:   Ht Readings from Last 1 Encounters:  05/01/16 5\' 3"  (1.6 m)    Weight:   Wt Readings from Last 1 Encounters:  05/01/16 103 lb (46.7 kg)    Ideal Body Weight:  52.3 kg  BMI:  Body mass index is 18.25 kg/m.  Estimated Nutritional Needs:   Kcal:  1240-1340 (HBE x 1.3-1.4)  Protein:  56-65 grams (1.2-1.4 grams/kg)  Fluid:  >/= 1.2 L/day (25 ml/kg)  EDUCATION NEEDS:   No education needs identified at this time  05/03/16, MS, RD, LDN Pager: [phone] After Hours Pager: [phone]

## 2016-05-04 NOTE — Evaluation (Addendum)
Clinical/Bedside Swallow Evaluation Patient Details  Name: Alexis Cobb MRN: [id number] Date of Birth: 07/15/1924  Today's Date: 05/04/2016 Time: SLP Start Time (ACUTE ONLY): 1200 SLP Stop Time (ACUTE ONLY): 1230 SLP Time Calculation (min) (ACUTE ONLY): 30 min  Past Medical History:  Past Medical History:  Diagnosis Date  . Arthritis   . ARTHRITIS, RHEUMATOID, HX OF 01/06/2007  . Cervical stenosis of spine   . HTN (hypertension)   . Hyperlipidemia   . MITRAL REGURGITATION 01/06/2007  . OSTEOARTHRITIS 01/14/2007  . OSTEOPOROSIS 01/14/2007  . Other specified forms of hearing loss 08/01/2009  . PACEMAKER, PERMANENT 01/06/2007  . Raynaud's syndrome 01/06/2007   Past Surgical History:  Past Surgical History:  Procedure Laterality Date  . ABDOMINAL HYSTERECTOMY    . APPENDECTOMY    . CATARACT EXTRACTION, BILATERAL    . MITRAL VALVE REPAIR    . ovarian tumor, benign    . PACEMAKER INSERTION     HPI:  82 year old female admitted s/p fall at home, presenting with respiratory failure with hypoxia, question early developing aspiration PNA, severe throat, diarrhea. CXR negative. BSE/MBS completed 05/01/16 with recommendation for NPO status. New orders received following Palliative Care Family meeting.   Assessment / Plan / Recommendation Clinical Impression  Pt presents with improvement in status since BSE and MBS 3 days ago. Pt continues to exhibit multiple swallows, raising suspicion for post-swallow pharyngeal residue, but no overt s/s aspiration on any consistency was observed. Pt vocal quality is low in intensity and hoarse, and volitional cough is weak. Pt is currently receiving blood. Will begin conservative diet of puree and nectar thick liquids and plan to proceed with repeat objective study tomorrow based on bedside presentation.    Aspiration Risk  Moderate aspiration risk    Diet Recommendation Dysphagia 1 (Puree);Nectar-thick liquid   Liquid Administration via:  Cup;Straw Medication Administration: Crushed with puree Supervision: Staff to assist with self feeding;Full supervision/cueing for compensatory strategies Compensations: Minimize environmental distractions;Slow rate;Small sips/bites Postural Changes: Seated upright at 90 degrees;Remain upright for at least 30 minutes after po intake    Other  Recommendations Oral Care Recommendations: Oral care QID   Follow up Recommendations  (TBD)      Frequency and Duration min 3x week  2 weeks       Prognosis Prognosis for Safe Diet Advancement: Good      Swallow Study   General Date of Onset: 04/30/16 HPI: 82 year old female admitted s/p fall at home, presenting with respiratory failure with hypoxia, question early developing aspiration PNA, severe throat, diarrhea. CXR negative. BSE/MBS completed 05/01/16 with recommendation for NPO status. New orders received following Palliative Care Family meeting. Type of Study: Bedside Swallow Evaluation Previous Swallow Assessment: BSE/MBS 05/01/16 Diet Prior to this Study: NPO Temperature Spikes Noted: No Respiratory Status: Nasal cannula History of Recent Intubation: No Behavior/Cognition: Alert;Cooperative;Pleasant mood Oral Cavity Assessment: Dry Oral Care Completed by SLP: No Oral Cavity - Dentition: Adequate natural dentition Vision: Functional for self-feeding Self-Feeding Abilities: Able to feed self;Needs assist Patient Positioning: Upright in bed Baseline Vocal Quality: Low vocal intensity;Hoarse Volitional Cough: Weak Volitional Swallow: Able to elicit    Oral/Motor/Sensory Function Overall Oral Motor/Sensory Function: Within functional limits   Ice Chips Ice chips: Not tested   Thin Liquid Thin Liquid: Impaired Presentation: Straw Pharyngeal  Phase Impairments: Multiple swallows    Nectar Thick Nectar Thick Liquid: Within functional limits Presentation: Cup   Honey Thick Honey Thick Liquid: Not tested   Puree Puree:  Impaired Presentation: Spoon Pharyngeal Phase Impairments: Multiple swallows   Solid   GO   Solid: Not tested       Alexis Cobb, MSP, CCC-SLP [phone]  Alexis Cobb 05/04/2016,12:48 PM

## 2016-05-04 NOTE — Progress Notes (Signed)
PROGRESS NOTE    Alexis Cobb  OQH:[id number] DOB: 1925-01-23 DOA: 04/30/2016 PCP: Oliver Barre, MD   Brief Narrative:  Alexis Cobb is a 82 y.o. female with medical history significant of mitral valve repair, pacemaker placement, HTN, and HLD presenting after she slipped and fell in the bedroom.  She niece (an Charity fundraiser) had called her about 115 today.  She didn't want to go see doctor despite sounding horrible.  The patient had a scratchy throat Wednesday, but the niece didn't talk to her yesterday.  The patient usually gets hair done Friday, but didn't feel well enough today to get hair done.    The niece called her sister (a speech therapist), who went to see her; the patient wouldn't answer the door.  The patient reports that she slipped and fell; she pressed her life alert - the company notified the RN niece's husband and he called her.  She arrived at the same time as the ambulance.  The patient was between 2 twin beds, had on socks but no slippers on hardwood floors.  Unable to get up. The niece last spoke with her at 58, arrived at 68.   Family reports that the patient is very pale.  She has had diarrhea for the last 2 days, had stool incontinence when they found her and she has had 3 watery stools while in the ER.  The patient states that she has never had such a severe sore throat before.  No headache.  No cough, no fever.  Has not been feeling SOB. Admitted for Respiratory Failure, Generalized Weakness and Debility, and Diarrhea. Found to have Parainfluenza 1 Virus. Patient's Hb/Hct Acutely dropped today so will trend H/H's and Transfuse if necessary. No S/Sx of Bleeding.   Assessment & Plan:   Principal Problem:   Infection due to parainfluenza virus 1 Active Problems:   Essential hypertension   Acute respiratory failure with hypoxia (HCC)   Acute pharyngitis   Diarrhea   Parainfluenza virus pharyngitis   Sore throat   Anemia  Generalized Weakness and Deconditioning from  Parainfluenza Virus and Diarrhea resulting in Fall -CT of Head showed No acute intracranial findings. There is moderate generalized atrophy and chronic appearing white matter hypodensities which likely represent small vessel ischemic disease. -Urinalysis was Negative -D/C IVF with LR at 75 mL/hr -PT and OT Recommend SNF -Teaching laboratory technician for Skilled Nursing Placement  Acute Respiratory Failure with Hypoxia suspect Aspiration PNA -Patient who is generally quite healthy (still drives, lives independently, etc) despite age of 82yo presenting with acute onset of debilitating illness -CXR was read as There is emphysematous changes of the lungs. No focal consolidation, pleural effusion, or pneumothorax. The cardiac silhouette is within normal limits. The aorta is mildly tortuous. Median sternotomy wires and left pectoral dual lead pacemaker device noted. There is osteopenia with degenerative changes of the spine. No acute fracture. -Has respiratory failure with hypoxia without apparent indication.  The concern is that this is an early/developing aspiration PNA, particularly in light of the severe sore throat. -This may all be attributable to a viral syndrome.  However, given her age alone her prognosis is guarded with an estimated APACHE II score of 12 and a predicted death rate of 14.6%. - Patient Passed Swallow Screen today and is on Dysphagia 1 Diet -C/w Empiric Levofloxacin as (patient has a PCN/cephalosporin allergy) for now. -Rapid Strep Negative -Blood Cx x2 showed NGTD; Patient's WBC went from 7.8 -> 11.1 -> 8.6 -> 8.5 -C/w -  Magic mouthwash with lidocaine for swish and spit; Received Chlorhexidine 15 mL -Respiratory Panel Positive for Parainfluenza Virus 1 -CT showed Moderate Pleural Effusions Bilaterally with Bibasilar Lung Consolidation; No Pneumothorax evident in visualized regions -Will need IR Consult in AM -Palliative Care Consulted for Goals of Care - Family Meeting today and  patient is still FULL CODE  Acute Anemia - From Pelvic Fracture/Hematoma -Hb/Hct dropped to 7.8/22.9  -No Bloody Bowel Movements or Hematemesis/Coffee Ground Emesis -Will Transfuse 2 units of PRBCs -CT of Abd/Pelvis w/o Contrast showed:  Multiple pelvic fractures in near anatomic alignment. There is moderate hemorrhage throughout the right iliacus muscle. Without intravenous contrast, is difficult to exclude active hemorrhage, although the appearance is consistent with hematoma throughout this muscle. There is nearby stranding in the soft tissues along the right retroperitoneum/upper pelvis extending to the lateral right perinephric region. There is no impression on the right kidney.  There is extensive abnormality in the soft tissues along the lateral abdominal and pelvic walls bilaterally, more severe on the right than on the left. There are hematomas which are well-defined lateral to the right greater trochanter. There is no air-fluid level suggesting abscess. -Discussed with Orthopedic Surgery Dr. Ophelia Charter and recommends supportive Care. Advised to continue ASA and SCDs.  -Repeat   Loose Diarrhea -Has not had recurrent episodes today -Will check for C. Difficile but no more episodes now -CT of Abdomen and Pelvis showed Fluid in the perirectal region and distal sigmoid colon region with soft tissue stranding in this area. This finding may be due to a degree of proctitis/distal colitis. Trauma involving the rectum cannot be excluded, although there is no ectopic air in this area or oral contrast extravasation. This finding may warrant sigmoidoscopy and proctoscopy to directly visualize these areas an assess for potential localized trauma. There are sigmoid diverticula without diverticulitis. No bowel wall thickening elsewhere noted. -Continue to Monitor  HTN -C/w Metoprolol Succinate 75 mg po Daily and with Losartan 100 mg po Daily.  Hyperlipidemia -C/w Pravastatin 40 mg sq daily     Hypokalemia, improved -Patient's K+ was 3.1 and went to 3.7 -Replete via IV as patient is NPO -Repeat CMP in AM  DVT prophylaxis: Asprin 81 mg and SCDs Code Status: FULL CODE Family Communication: Niece at bedside Disposition Plan: SNF at D/C  Consultants:   Palliative Care  Interventional Radiology   Procedures: Swallow Screen   Antimicrobials: IV Levofloxacin IV q48h for CAP 04/30/16 -->  Subjective: Seen and examined at bedside discussed findings with her about Ct. She felt a little better and was happy she could eat. No N/V. Sore throat is improved. No other concerns or complaints.   Objective: Vitals:   05/04/16 1214 05/04/16 1450 05/04/16 1623 05/04/16 1650  BP: (!) 126/52 135/61 (!) 137/55 (!) 131/58  Pulse: 95 81 73 74  Resp: 18 18 18 18   Temp: 97.6 F (36.4 C) 98.2 F (36.8 C) 98 F (36.7 C) 97.9 F (36.6 C)  TempSrc: Axillary Oral Oral Oral  SpO2: 99% 100% 100% 99%  Weight:      Height:        Intake/Output Summary (Last 24 hours) at 05/04/16 1828 Last data filed at 05/04/16 1638  Gross per 24 hour  Intake              865 ml  Output             1575 ml  Net             -  710 ml   Filed Weights   05/01/16 0055 05/01/16 0122  Weight: 46.7 kg (103 lb) 46.7 kg (103 lb)    Examination: Physical Exam:  Constitutional: Thin Elderly ill appearing female laying in bed frail and weak Eyes: Lids and conjunctivae normal, sclerae anicteric  ENMT: External Ears, Nose appear normal. Grossly normal hearing.  Neck: Appears normal, supple, no cervical masses, normal ROM, no appreciable thyromegaly, no JVD Respiratory: Diminished to auscultation bilaterally, no wheezing, rales, rhonchi or crackles. Normal respiratory effort and patient is not tachypenic. No accessory muscle use. Mild cough.  Cardiovascular: RRR, 2/6 Systolic Murmur. No rubs / gallops. S1 and S2 auscultated. Mild LE extremity edema.  Abdomen: Soft, non-tender, non-distended. No masses  palpated. No appreciable hepatosplenomegaly. Bowel sounds positive x4.  GU: Deferred. Foley in Place. Musculoskeletal: No clubbing / cyanosis of digits/nails. No joint deformity upper and lower extremities.  Skin: Ecchymosis noted on arms.  Neurologic: CN 2-12 grossly intact with no focal deficits. Sensation intact in all 4 Extremities. Romberg sign cerebellar reflexes not assessed.  Psychiatric: Normal judgment and insight. Alert and oriented x 3. Normal mood and appropriate affect.   Data Reviewed: I have personally reviewed following labs and imaging studies  CBC:  Recent Labs Lab 04/30/16 2027 05/01/16 0219 05/02/16 0551 05/03/16 0600 05/03/16 0943 05/03/16 1456 05/03/16 2017 05/04/16 0248  WBC 6.4 7.8 11.1* 8.6  --   --   --  8.5  NEUTROABS 4.8  --  8.8* 6.5  --   --   --  6.4  HGB 13.5 13.0 11.8* 8.8* 8.6* 8.6* 8.8* 8.0*  7.8*  HCT 41.1 39.2 34.5* 25.9* 25.5* 24.8* 26.6* 23.5*  22.9*  MCV 97.9 97.3 95.3 95.6  --   --   --  96.3  PLT 147* 126* 127* 103*  --   --   --  123*   Basic Metabolic Panel:  Recent Labs Lab 04/30/16 2027 05/01/16 0219 05/02/16 0551 05/03/16 0600 05/04/16 0248  NA 136 135 136 138 141  K 3.9 3.5 3.1* 3.7 3.7  CL 103 102 103 106 110  CO2 25 24 26 23  21*  GLUCOSE 113* 158* 108* 81 77  BUN 12 13 13 17  21*  CREATININE 0.81 0.77 0.67 0.77 0.72  CALCIUM 9.0 8.4* 8.4* 8.2* 8.6*  MG  --   --  1.6* 1.9 1.9  PHOS  --   --  2.7 2.5 2.8   GFR: Estimated Creatinine Clearance: 35.1 mL/min (by C-G formula based on SCr of 0.72 mg/dL). Liver Function Tests:  Recent Labs Lab 04/30/16 2027 05/02/16 0551 05/03/16 0600 05/04/16 0248  AST 39 49* 40 40  ALT 23 20 16 19   ALKPHOS 54 41 31* 35*  BILITOT 0.8 0.8 0.8 1.4*  PROT 6.3* 5.3* 4.5* 4.6*  ALBUMIN 3.6 3.1* 2.4* 2.5*   No results for input(s): LIPASE, AMYLASE in the last 168 hours. No results for input(s): AMMONIA in the last 168 hours. Coagulation Profile: No results for input(s): INR,  PROTIME in the last 168 hours. Cardiac Enzymes: No results for input(s): CKTOTAL, CKMB, CKMBINDEX, TROPONINI in the last 168 hours. BNP (last 3 results) No results for input(s): PROBNP in the last 8760 hours. HbA1C: No results for input(s): HGBA1C in the last 72 hours. CBG: No results for input(s): GLUCAP in the last 168 hours. Lipid Profile: No results for input(s): CHOL, HDL, LDLCALC, TRIG, CHOLHDL, LDLDIRECT in the last 72 hours. Thyroid Function Tests: No results for input(s): TSH, T4TOTAL,  FREET4, T3FREE, THYROIDAB in the last 72 hours. Anemia Panel: No results for input(s): VITAMINB12, FOLATE, FERRITIN, TIBC, IRON, RETICCTPCT in the last 72 hours. Sepsis Labs: No results for input(s): PROCALCITON, LATICACIDVEN in the last 168 hours.  Recent Results (from the past 240 hour(s))  Rapid strep screen (not at [facility])     Status: None   Collection Time: 05/01/16  1:52 AM  Result Value Ref Range Status   Streptococcus, Group A Screen (Direct) NEGATIVE NEGATIVE Final    Comment: (NOTE) A Rapid Antigen test may result negative if the antigen level in the sample is below the detection level of this test. The FDA has not cleared this test as a stand-alone test therefore the rapid antigen negative result has reflexed to a Group A Strep culture.   Culture, group A strep     Status: None   Collection Time: 05/01/16  1:52 AM  Result Value Ref Range Status   Specimen Description THROAT  Final   Special Requests NONE Reflexed from [id number]  Final   Culture   Final    NO GROUP A STREP (S.PYOGENES) ISOLATED Performed at [facility]    Report Status 05/03/2016 FINAL  Final  Culture, blood (routine x 2) Call MD if unable to obtain prior to antibiotics being given     Status: None (Preliminary result)   Collection Time: 05/01/16  2:19 AM  Result Value Ref Range Status   Specimen Description BLOOD RIGHT ARM  Final   Special Requests BOTTLES DRAWN AEROBIC ONLY 7 CC  Final   Culture    Final    NO GROWTH 3 DAYS Performed at [facility]    Report Status PENDING  Incomplete  Culture, blood (routine x 2) Call MD if unable to obtain prior to antibiotics being given     Status: None (Preliminary result)   Collection Time: 05/01/16  2:19 AM  Result Value Ref Range Status   Specimen Description BLOOD RIGHT WRIST  Final   Special Requests BOTTLES DRAWN AEROBIC AND ANAEROBIC 5 CC  Final   Culture   Final    NO GROWTH 3 DAYS Performed at [facility]    Report Status PENDING  Incomplete  Respiratory Panel by PCR     Status: Abnormal   Collection Time: 05/01/16  4:04 PM  Result Value Ref Range Status   Adenovirus NOT DETECTED NOT DETECTED Final   Coronavirus 229E NOT DETECTED NOT DETECTED Final   Coronavirus HKU1 NOT DETECTED NOT DETECTED Final   Coronavirus NL63 NOT DETECTED NOT DETECTED Final   Coronavirus OC43 NOT DETECTED NOT DETECTED Final   Metapneumovirus NOT DETECTED NOT DETECTED Final   Rhinovirus / Enterovirus NOT DETECTED NOT DETECTED Final   Influenza A NOT DETECTED NOT DETECTED Final   Influenza B NOT DETECTED NOT DETECTED Final   Parainfluenza Virus 1 DETECTED (A) NOT DETECTED Final   Parainfluenza Virus 2 NOT DETECTED NOT DETECTED Final   Parainfluenza Virus 3 NOT DETECTED NOT DETECTED Final   Parainfluenza Virus 4 NOT DETECTED NOT DETECTED Final   Respiratory Syncytial Virus NOT DETECTED NOT DETECTED Final   Bordetella pertussis NOT DETECTED NOT DETECTED Final   Chlamydophila pneumoniae NOT DETECTED NOT DETECTED Final   Mycoplasma pneumoniae NOT DETECTED NOT DETECTED Final    Comment: Performed at [facility]    Radiology Studies: Ct Abdomen Pelvis Wo Contrast  Result Date: 05/04/2016 CLINICAL DATA:  Recent fall.  Diarrhea.  Drop in hemoglobin EXAM: CT ABDOMEN  AND PELVIS WITHOUT CONTRAST TECHNIQUE: Multidetector CT imaging of the abdomen and pelvis was performed following the standard protocol without IV contrast. A small amount  of oral contrast is evident. COMPARISON:  None. FINDINGS: Lower chest: There are moderate pleural effusions bilaterally with bibasilar consolidation. No pneumothorax evident in the visualized lung regions. Pacemaker leads are attached to the right atrium and right ventricle. There are foci of coronary artery calcification. Visualized pericardium is not appreciably thickened. Hepatobiliary: No focal liver lesions are evident on this noncontrast enhanced study. There is no very hepatic fluid. No liver laceration or rupture is evident. Gallbladder wall is not appreciably thickened. There is no biliary duct dilatation. Pancreas: There is no pancreatic mass or inflammatory focus. No peripancreatic fluid or inflammation is evident. Spleen: The spleen appears intact on this noncontrast enhanced study. No splenic laceration or rupture is evident. No splenic mass or perisplenic fluid. Adrenals/Urinary Tract: Adrenals appear unremarkable bilaterally. There is scarring in the left kidney, particularly in the upper pole region. There is no renal mass on either side. There is perinephric stranding on the right. There is no hydronephrosis on either side. In the left kidney, there is a 4 mm calculus with an adjacent 3 mm calculus. In the right kidney midportion, there is a 1.0 x 0.8 cm calculus. There are no ureteral calculi on either side. Urinary bladder contains a Foley catheter. A small amount of air in the urinary bladder is likely due to instrumentation. The wall of the urinary bladder appears mildly thickened. Stomach/Bowel: There is soft tissue stranding in the perirectal region with moderate fluid in this area. Similar changes are noted in the distal sigmoid colon. There are multiple sigmoid diverticula without diverticulitis. There is no bowel wall thickening elsewhere. No bowel obstruction. No evident free air or portal venous air. Vascular/Lymphatic: There is extensive atherosclerotic calcification in the aorta and  common iliac arteries. There is no appreciable abdominal aortic aneurysm. Major mesenteric vessels appear patent on this noncontrast enhanced study. There is no adenopathy in the abdomen or pelvis. Reproductive: Uterus is absent. There is no pelvic mass. There is no free fluid in the pelvis, although there is fluid in the perirectal region. Other: Appendix appears normal. No abscess is evident in the abdomen or pelvis. There is a right femoral hernia which contains only fat. Musculoskeletal: There is hemorrhage in the right iliacus muscle with diffuse enlargement of the right iliacus muscle compared to its normal appearing counterpart on the left. Slightly superiorly 8 this muscle, there is stranding in the perinephric region which probably is due to hemorrhage which arose from this area in the pelvis/retroperitoneum. In addition, there is extensive soft tissue stranding throughout the right lateral abdominal and pelvic walls. There is a focal well-defined hematoma lateral to the right greater trochanter measuring 6.6 x 3.3 x 2.8 cm. Smaller apparent hematomas are noted throughout the area of trauma as well. A lesser degree of thickening is noted in the left lateral pelvic wall. Bones are diffusely osteoporotic. There is evidence of a fracture of the lateral right ischium at the junction with the right acetabulum there is an impacted fracture of the medial right pubic symphysis. There is a fracture of the mid right ischium as well as a fracture of the anterior left ischium near the pubic symphysis. There is a fracture of the superior right sacral ala with subtle impaction in this area. IMPRESSION: Multiple pelvic fractures in near anatomic alignment. There is moderate hemorrhage throughout the right iliacus muscle.  Without intravenous contrast, is difficult to exclude active hemorrhage, although the appearance is consistent with hematoma throughout this muscle. There is nearby stranding in the soft tissues along the  right retroperitoneum/upper pelvis extending to the lateral right perinephric region. There is no impression on the right kidney. There is extensive abnormality in the soft tissues along the lateral abdominal and pelvic walls bilaterally, more severe on the right than on the left. There are hematomas which are well-defined lateral to the right greater trochanter. There is no air-fluid level suggesting abscess. There are moderate pleural effusions bilaterally with bibasilar lung consolidation. No pneumothorax evident in visualized regions. Calculi in each kidney. Areas of scarring and atrophy in left kidney. No hydronephrosis. Note that there is thickening of the urinary bladder wall in spite of catheterization. Suspect a degree of cystitis. Fluid in the perirectal region and distal sigmoid colon region with soft tissue stranding in this area. This finding may be due to a degree of proctitis/distal colitis. Trauma involving the rectum cannot be excluded, although there is no ectopic air in this area or oral contrast extravasation. This finding may warrant sigmoidoscopy and proctoscopy to directly visualize these areas an assess for potential localized trauma. There are sigmoid diverticula without diverticulitis. No bowel wall thickening elsewhere noted. There is aortoiliac atherosclerosis. Aorta appears intact. There is no periaortic or major vessel perivascular fluid. There is a right femoral hernia containing only fat. Electronically Signed   By: Bretta BangWilliam  Woodruff III M.D.   On: 05/04/2016 10:45   Scheduled Meds: . sodium chloride   Intravenous Once  . aspirin EC  81 mg Oral QHS  . chlorhexidine  15 mL Mouth/Throat BID  . levofloxacin (LEVAQUIN) IV  750 mg Intravenous Q48H  . losartan  100 mg Oral Daily  . metoprolol succinate  75 mg Oral Daily  . pravastatin  40 mg Oral q1800   Continuous Infusions: . lactated ringers 75 mL/hr at 05/03/16 1445    LOS: 3 days   Merlene Laughtermair Latif Sheikh, DO [facility] Pager [phone]  If 7PM-7AM, please contact night-coverage [email or web address] Password [email or web address] 05/04/2016, 6:28 PM

## 2016-05-05 ENCOUNTER — Inpatient Hospital Stay (HOSPITAL_COMMUNITY): Payer: Medicare Other

## 2016-05-05 DIAGNOSIS — B348 Other viral infections of unspecified site: Secondary | ICD-10-CM

## 2016-05-05 DIAGNOSIS — I10 Essential (primary) hypertension: Secondary | ICD-10-CM

## 2016-05-05 DIAGNOSIS — B9789 Other viral agents as the cause of diseases classified elsewhere: Secondary | ICD-10-CM

## 2016-05-05 DIAGNOSIS — J690 Pneumonitis due to inhalation of food and vomit: Principal | ICD-10-CM

## 2016-05-05 DIAGNOSIS — J9601 Acute respiratory failure with hypoxia: Secondary | ICD-10-CM

## 2016-05-05 DIAGNOSIS — J028 Acute pharyngitis due to other specified organisms: Secondary | ICD-10-CM

## 2016-05-05 DIAGNOSIS — D62 Acute posthemorrhagic anemia: Secondary | ICD-10-CM

## 2016-05-05 LAB — CBC WITH DIFFERENTIAL/PLATELET
Basophils Absolute: 0.1 10*3/uL (ref 0.0–0.1)
Basophils Relative: 1 %
EOS PCT: 2 %
Eosinophils Absolute: 0.2 10*3/uL (ref 0.0–0.7)
HEMATOCRIT: 29.8 % — AB (ref 36.0–46.0)
HEMOGLOBIN: 10 g/dL — AB (ref 12.0–15.0)
LYMPHS PCT: 16 %
Lymphs Abs: 1.3 10*3/uL (ref 0.7–4.0)
MCH: 29.5 pg (ref 26.0–34.0)
MCHC: 33.6 g/dL (ref 30.0–36.0)
MCV: 87.9 fL (ref 78.0–100.0)
MONO ABS: 0.8 10*3/uL (ref 0.1–1.0)
MONOS PCT: 10 %
NEUTROS PCT: 71 %
Neutro Abs: 5.6 10*3/uL (ref 1.7–7.7)
Platelets: 140 10*3/uL — ABNORMAL LOW (ref 150–400)
RBC: 3.39 MIL/uL — AB (ref 3.87–5.11)
RDW: 19 % — AB (ref 11.5–15.5)
WBC: 8 10*3/uL (ref 4.0–10.5)

## 2016-05-05 LAB — BODY FLUID CELL COUNT WITH DIFFERENTIAL
Lymphs, Fluid: 4 %
Monocyte-Macrophage-Serous Fluid: 94 % — ABNORMAL HIGH (ref 50–90)
Neutrophil Count, Fluid: 2 % (ref 0–25)
Total Nucleated Cell Count, Fluid: 317 cu mm (ref 0–1000)

## 2016-05-05 LAB — COMPREHENSIVE METABOLIC PANEL
ALBUMIN: 2.5 g/dL — AB (ref 3.5–5.0)
ALT: 18 U/L (ref 14–54)
AST: 37 U/L (ref 15–41)
Alkaline Phosphatase: 39 U/L (ref 38–126)
Anion gap: 7 (ref 5–15)
BILIRUBIN TOTAL: 1.8 mg/dL — AB (ref 0.3–1.2)
BUN: 21 mg/dL — AB (ref 6–20)
CHLORIDE: 109 mmol/L (ref 101–111)
CO2: 27 mmol/L (ref 22–32)
CREATININE: 0.64 mg/dL (ref 0.44–1.00)
Calcium: 8.5 mg/dL — ABNORMAL LOW (ref 8.9–10.3)
GFR calc Af Amer: >60 mL/min (ref >60)
GFR calc non Af Amer: >60 mL/min (ref >60)
GLUCOSE: 114 mg/dL — AB (ref 65–99)
Potassium: 3.8 mmol/L (ref 3.5–5.1)
Sodium: 143 mmol/L (ref 135–145)
Total Protein: 4.7 g/dL — ABNORMAL LOW (ref 6.5–8.1)

## 2016-05-05 LAB — TYPE AND SCREEN
ABO/RH(D): O POS
ANTIBODY SCREEN: NEGATIVE
UNIT DIVISION: 0
Unit division: 0

## 2016-05-05 LAB — GRAM STAIN

## 2016-05-05 LAB — LACTATE DEHYDROGENASE: LDH: 263 U/L — ABNORMAL HIGH (ref 98–192)

## 2016-05-05 LAB — PHOSPHORUS: Phosphorus: 2.1 mg/dL — ABNORMAL LOW (ref 2.5–4.6)

## 2016-05-05 LAB — MAGNESIUM: Magnesium: 1.8 mg/dL (ref 1.7–2.4)

## 2016-05-05 LAB — PROTEIN, BODY FLUID: Total protein, fluid: <3 g/dL

## 2016-05-05 LAB — GLUCOSE, SEROUS FLUID: GLUCOSE FL: 105 mg/dL

## 2016-05-05 LAB — LACTATE DEHYDROGENASE, PLEURAL OR PERITONEAL FLUID: LD, Fluid: 118 U/L — ABNORMAL HIGH (ref 3–23)

## 2016-05-05 LAB — PROCALCITONIN: Procalcitonin: 0.14 ng/mL

## 2016-05-05 MED ORDER — FUROSEMIDE 10 MG/ML IJ SOLN
20.0000 mg | Freq: Once | INTRAMUSCULAR | Status: AC
  Administered 2016-05-05: 20 mg via INTRAVENOUS
  Filled 2016-05-05: qty 2

## 2016-05-05 NOTE — Care Management Important Message (Signed)
Important Message  Patient Details  Name: Alexis Cobb MRN: [id number] Date of Birth: [birth year]   Medicare Important Message Given:  Yes    Caren Macadam 05/05/2016, 12:03 PMImportant Message  Patient Details  Name: Alexis Cobb MRN: [id number] Date of Birth: [birth year]   Medicare Important Message Given:  Yes    Caren Macadam 05/05/2016, 12:03 PM

## 2016-05-05 NOTE — Consult Note (Addendum)
Referring Provider:  Dr. Arbutus Leas  Primary Care Physician:  Oliver Barre, MD Primary Gastroenterologist:  Gentry Fitz  Reason for Consultation:  Diarrhea/ Abnormal CT scan   HPI: Alexis Cobb is a 82 y.o. female with past medical history of mitral valve repair, pacemaker placement admitted to the hospital with weakness and fall. GI is consulted for evaluation of loose stool as well as abnormal CT scan showing thickening in the rectum and distal sigmoid area. CT scan was ordered to evaluate for drop in hemoglobin after following which also revealed large hematoma in the right intertrochanteric area measuring 6.6 x 3 cm . It also showed multiple pelvic fractures .   Patient seen and examined. Niece at bedside. Patient complaining of neck pain. Denied any abdominal pain. Was complaining of some loose stool on admission which is resolved now. No bowel movement in last 2 days. Denied any blood in the stool or black stool Discussed with the niece in detail.  Not Sure of any previous colonoscopy.  Past Medical History:  Diagnosis Date  . Arthritis   . ARTHRITIS, RHEUMATOID, HX OF 01/06/2007  . Cervical stenosis of spine   . HTN (hypertension)   . Hyperlipidemia   . MITRAL REGURGITATION 01/06/2007  . OSTEOARTHRITIS 01/14/2007  . OSTEOPOROSIS 01/14/2007  . Other specified forms of hearing loss 08/01/2009  . PACEMAKER, PERMANENT 01/06/2007  . Raynaud's syndrome 01/06/2007    Past Surgical History:  Procedure Laterality Date  . ABDOMINAL HYSTERECTOMY    . APPENDECTOMY    . CATARACT EXTRACTION, BILATERAL    . MITRAL VALVE REPAIR    . ovarian tumor, benign    . PACEMAKER INSERTION      Prior to Admission medications   Medication Sig Start Date End Date Taking? Authorizing Provider  aspirin EC 81 MG tablet Take 81 mg by mouth at bedtime.   Yes Historical Provider, MD  Calcium Carbonate-Vitamin D (CALCIUM 600+D) 600-400 MG-UNIT tablet Take 1 tablet by mouth 2 (two) times daily.   Yes Historical  Provider, MD  cholecalciferol (VITAMIN D) 1000 units tablet Take 1,000 Units by mouth daily.   Yes Historical Provider, MD  furosemide (LASIX) 20 MG tablet Take 20 mg by mouth daily as needed for edema.   Yes Historical Provider, MD  losartan (COZAAR) 100 MG tablet Take 100 mg by mouth daily.   Yes Historical Provider, MD  lovastatin (MEVACOR) 40 MG tablet Take 40 mg by mouth at bedtime.   Yes Historical Provider, MD  metoprolol succinate (TOPROL-XL) 50 MG 24 hr tablet Take 75 mg by mouth daily. Take with or immediately following a meal.   Yes Historical Provider, MD  Multiple Vitamin (MULTIVITAMIN WITH MINERALS) TABS tablet Take 1 tablet by mouth daily.   Yes Historical Provider, MD  nitroGLYCERIN (NITROSTAT) 0.4 MG SL tablet Place 0.4 mg under the tongue every 5 (five) minutes as needed for chest pain.   Yes Historical Provider, MD    Scheduled Meds: . sodium chloride   Intravenous Once  . aspirin EC  81 mg Oral QHS  . chlorhexidine  15 mL Mouth/Throat BID  . losartan  100 mg Oral Daily  . metoprolol succinate  75 mg Oral Daily  . pravastatin  40 mg Oral q1800   Continuous Infusions: PRN Meds:.acetaminophen **OR** acetaminophen, food thickener, LORazepam, magic mouthwash w/lidocaine, morphine injection, ondansetron **OR** ondansetron (ZOFRAN) IV  Allergies as of 04/30/2016 - Review Complete 04/30/2016  Allergen Reaction Noted  . Ace inhibitors Rash 12/31/2010  . Cephalexin Rash  01/06/2007  . Penicillins Rash and Other (See Comments) 01/06/2007  . Risedronate sodium Rash 01/06/2007    Family History  Problem Relation Age of Onset  . Coronary artery disease    . Hypertension      Social History   Social History  . Marital status: Widowed    Spouse name: N/A  . Number of children: N/A  . Years of education: N/A   Occupational History  . retired Catering manager Retired   Social History Main Topics  . Smoking status: Never Smoker  . Smokeless tobacco: Never Used  . Alcohol  use No  . Drug use: No  . Sexual activity: Not on file   Other Topics Concern  . Not on file   Social History Narrative  . No narrative on file    Review of Systems: All negative except as stated above in HPI.Review of Systems  Constitutional: Positive for malaise/fatigue. Negative for chills and fever.  HENT: Negative for ear discharge, ear pain and nosebleeds.   Eyes: Negative for pain and discharge.  Respiratory: Negative for hemoptysis.   Cardiovascular: Negative for chest pain and palpitations.  Gastrointestinal: Negative for blood in stool, melena and vomiting.  Genitourinary: Positive for flank pain. Negative for hematuria.  Musculoskeletal: Positive for back pain, falls, myalgias and neck pain.  Skin: Positive for rash.  Neurological: Positive for dizziness, weakness and headaches. Negative for seizures.  Endo/Heme/Allergies: Does not bruise/bleed easily.  Psychiatric/Behavioral: Negative for hallucinations and suicidal ideas.     Physical Exam: Vital signs: Vitals:   05/04/16 2105 05/05/16 0424  BP: 132/71 127/63  Pulse: 72 71  Resp: 16 16  Temp: 98.5 F (36.9 C) 98.6 F (37 C)   Last BM Date: 05/01/16 General:   Alert, in distress secondary to knee pain Eyes. Extraocular movement intac Normal conjunctiva No scleral icterus Lungs:  Clear throughout to auscultation.   No wheezes, crackles, or rhonchi. No acute distress. Heart:  Regular rate and rhythm; no murmurs, clicks, rubs,  or gallops. Abdomen: soft, nontender, nondistended, BS + No mass felt. No peritoneal signs LE : no edema  Rectal:  Deferred  GI:  Lab Results:  Recent Labs  05/03/16 0600  05/03/16 2017 05/04/16 0248 05/05/16 0542  WBC 8.6  --   --  8.5 8.0  HGB 8.8*  < > 8.8* 8.0*  7.8* 10.0*  HCT 25.9*  < > 26.6* 23.5*  22.9* 29.8*  PLT 103*  --   --  123* 140*  < > = values in this interval not displayed. BMET  Recent Labs  05/03/16 0600 05/04/16 0248 05/05/16 0542  NA 138 141  143  K 3.7 3.7 3.8  CL 106 110 109  CO2 23 21* 27  GLUCOSE 81 77 114*  BUN 17 21* 21*  CREATININE 0.77 0.72 0.64  CALCIUM 8.2* 8.6* 8.5*   LFT  Recent Labs  05/05/16 0542  PROT 4.7*  ALBUMIN 2.5*  AST 37  ALT 18  ALKPHOS 39  BILITOT 1.8*   PT/INR No results for input(s): LABPROT, INR in the last 72 hours.   Studies/Results: Ct Abdomen Pelvis Wo Contrast  Result Date: 05/04/2016 CLINICAL DATA:  Recent fall.  Diarrhea.  Drop in hemoglobin EXAM: CT ABDOMEN AND PELVIS WITHOUT CONTRAST TECHNIQUE: Multidetector CT imaging of the abdomen and pelvis was performed following the standard protocol without IV contrast. A small amount of oral contrast is evident. COMPARISON:  None. FINDINGS: Lower chest: There are moderate pleural effusions bilaterally with  bibasilar consolidation. No pneumothorax evident in the visualized lung regions. Pacemaker leads are attached to the right atrium and right ventricle. There are foci of coronary artery calcification. Visualized pericardium is not appreciably thickened. Hepatobiliary: No focal liver lesions are evident on this noncontrast enhanced study. There is no very hepatic fluid. No liver laceration or rupture is evident. Gallbladder wall is not appreciably thickened. There is no biliary duct dilatation. Pancreas: There is no pancreatic mass or inflammatory focus. No peripancreatic fluid or inflammation is evident. Spleen: The spleen appears intact on this noncontrast enhanced study. No splenic laceration or rupture is evident. No splenic mass or perisplenic fluid. Adrenals/Urinary Tract: Adrenals appear unremarkable bilaterally. There is scarring in the left kidney, particularly in the upper pole region. There is no renal mass on either side. There is perinephric stranding on the right. There is no hydronephrosis on either side. In the left kidney, there is a 4 mm calculus with an adjacent 3 mm calculus. In the right kidney midportion, there is a 1.0 x 0.8  cm calculus. There are no ureteral calculi on either side. Urinary bladder contains a Foley catheter. A small amount of air in the urinary bladder is likely due to instrumentation. The wall of the urinary bladder appears mildly thickened. Stomach/Bowel: There is soft tissue stranding in the perirectal region with moderate fluid in this area. Similar changes are noted in the distal sigmoid colon. There are multiple sigmoid diverticula without diverticulitis. There is no bowel wall thickening elsewhere. No bowel obstruction. No evident free air or portal venous air. Vascular/Lymphatic: There is extensive atherosclerotic calcification in the aorta and common iliac arteries. There is no appreciable abdominal aortic aneurysm. Major mesenteric vessels appear patent on this noncontrast enhanced study. There is no adenopathy in the abdomen or pelvis. Reproductive: Uterus is absent. There is no pelvic mass. There is no free fluid in the pelvis, although there is fluid in the perirectal region. Other: Appendix appears normal. No abscess is evident in the abdomen or pelvis. There is a right femoral hernia which contains only fat. Musculoskeletal: There is hemorrhage in the right iliacus muscle with diffuse enlargement of the right iliacus muscle compared to its normal appearing counterpart on the left. Slightly superiorly 8 this muscle, there is stranding in the perinephric region which probably is due to hemorrhage which arose from this area in the pelvis/retroperitoneum. In addition, there is extensive soft tissue stranding throughout the right lateral abdominal and pelvic walls. There is a focal well-defined hematoma lateral to the right greater trochanter measuring 6.6 x 3.3 x 2.8 cm. Smaller apparent hematomas are noted throughout the area of trauma as well. A lesser degree of thickening is noted in the left lateral pelvic wall. Bones are diffusely osteoporotic. There is evidence of a fracture of the lateral right ischium  at the junction with the right acetabulum there is an impacted fracture of the medial right pubic symphysis. There is a fracture of the mid right ischium as well as a fracture of the anterior left ischium near the pubic symphysis. There is a fracture of the superior right sacral ala with subtle impaction in this area. IMPRESSION: Multiple pelvic fractures in near anatomic alignment. There is moderate hemorrhage throughout the right iliacus muscle. Without intravenous contrast, is difficult to exclude active hemorrhage, although the appearance is consistent with hematoma throughout this muscle. There is nearby stranding in the soft tissues along the right retroperitoneum/upper pelvis extending to the lateral right perinephric region. There is no impression  on the right kidney. There is extensive abnormality in the soft tissues along the lateral abdominal and pelvic walls bilaterally, more severe on the right than on the left. There are hematomas which are well-defined lateral to the right greater trochanter. There is no air-fluid level suggesting abscess. There are moderate pleural effusions bilaterally with bibasilar lung consolidation. No pneumothorax evident in visualized regions. Calculi in each kidney. Areas of scarring and atrophy in left kidney. No hydronephrosis. Note that there is thickening of the urinary bladder wall in spite of catheterization. Suspect a degree of cystitis. Fluid in the perirectal region and distal sigmoid colon region with soft tissue stranding in this area. This finding may be due to a degree of proctitis/distal colitis. Trauma involving the rectum cannot be excluded, although there is no ectopic air in this area or oral contrast extravasation. This finding may warrant sigmoidoscopy and proctoscopy to directly visualize these areas an assess for potential localized trauma. There are sigmoid diverticula without diverticulitis. No bowel wall thickening elsewhere noted. There is aortoiliac  atherosclerosis. Aorta appears intact. There is no periaortic or major vessel perivascular fluid. There is a right femoral hernia containing only fat. Electronically Signed   By: Bretta Bang III M.D.   On: 05/04/2016 10:45   [place] Chest  Result Date: 05/05/2016 CLINICAL DATA:  Pleural effusion. EXAM: CHEST ULTRASOUND COMPARISON:  Chest x-ray 04/30/2016 . FINDINGS: Small bilateral pleural effusions are noted. Thoracentesis was not performed because patient was unable to sit up or lay on her side. IMPRESSION: Small bilateral pleural effusions. Thoracentesis not performed as above . Electronically Signed   By: Maisie Fus  Register   On: 05/05/2016 10:35    Impression/Plan: - abnormal CT scan showing  Fluid in the perirectal region and distal sigmoid colon region with soft tissue stranding in this area.  - S/P fall with Multiple Pelvic fracture  - Large Hematoma  - Diarrhea. Resolved.  - Plural Effusion   Recommendations  --------------------------- - Long discussion with the patient and Niece. Diarrhea has resolved. Discuss the CT scan findings as well. Flex sig procedure discussed including indication and risk. They have declined procedure at this time. Patient with multiple issues and they do not want any endoscopic intervention at this time.  - Recommend repeat CT scan with oral contrast in 2 to 4 weeks to document resolution of abnormal finding.  - GI will sign off. Call us back if needed.     LOS: 4 days   Kathi Der  MD, FACP 05/05/2016, 1:05 PM  Pager [phone] If no answer or after 5 PM call [phone]

## 2016-05-05 NOTE — Progress Notes (Signed)
Plan for d/c to SNF, discharge planning per CSW. [phone] 

## 2016-05-05 NOTE — Progress Notes (Signed)
Speech Language Pathology Treatment: Dysphagia  Patient Details Name: Alexis Cobb MRN: [id number] DOB: [birth year] Today's Date: 05/05/2016 Time: 2336-1224 SLP Time Calculation (min) (ACUTE ONLY): 16 min  Assessment / Plan / Recommendation Clinical Impression  Pt consumed half tsp amounts of nectar-thickened liquids with delayed cough noted after 6-7 tsp amounts given; pt's cough is weak and her vocal quality is low intensity/hoarse when speaking; her mentation has improved, but she continues to be extremely weak, so MBS is deferred until pt's strength improves; MBS results from 05/01/16 reviewed and pt is exhibiting s/s similar to MBS results indicating severe pharyngeal dysphagia with limitations of clearing pharynx using compensatory strategies and aspiration across all consistencies; will re-assess on 05/06/16 to determine if objective testing is beneficial to pt given palliative consult and previous MBS results with likely similar results if repeated.  Conservative diet of Dysphagia 1/nectar-thickened liquids con't with swallowing precautions posted in room in place.   HPI HPI: 82 year old female admitted s/p fall at home, presenting with respiratory failure with hypoxia, question early developing aspiration PNA, severe throat, diarrhea. CXR negative. BSE/MBS completed 05/01/16 with recommendation for NPO status. New orders received following Palliative Care Family meeting.      SLP Plan  Continue with current plan of care     Recommendations  Diet recommendations: Dysphagia 1 (puree);Nectar-thick liquid Liquids provided via: Teaspoon Medication Administration: Crushed with puree Supervision: Patient able to self feed;Staff to assist with self feeding Compensations: Minimize environmental distractions;Slow rate;Small sips/bites Postural Changes and/or Swallow Maneuvers: Seated upright 90 degrees (or as high as tolerated by pt)                Oral Care Recommendations: Oral  care QID Follow up Recommendations: Other (comment) Plan: Continue with current plan of care                       Lemmie Vanlanen,PAT, M.S., CCC-SLP 05/05/2016, 12:00 PM

## 2016-05-05 NOTE — Progress Notes (Signed)
Patient unable to sit up or lay on her side in order to be in a position where we can best assess for pleural fluid.  She does appear to have a small amount from the position she is in, but unable to assess if she has enough fluid or a safe window.  Due to pelvic fractures, she states she can not lay on her side.  After a lengthy discussion with her niece, the patient would like to go back up stairs.  We will cancel her procedure.  Brittany Osier E 10:27 AM 05/05/2016

## 2016-05-05 NOTE — Progress Notes (Addendum)
PT Cancellation Note  Patient Details Name: Alexis Cobb MRN: [id number] DOB: 09/30/1924   Cancelled Treatment:    Reason Eval/Treat Not Completed: Patient at procedure or test/unavailable. Will check back as schedule permits.  Addendum: Pt returned to unit and visiting with family member who requests PT hold off today as pt awaiting another procedure.  Discussed new CT findings of multiple pelvic fxs.  Discussed PT services including rolling and supine <> sit.  Will check back tomorrow.   Eston Heslin LUBECK 05/05/2016, 10:19 AM

## 2016-05-05 NOTE — Procedures (Signed)
Ultrasound-guided diagnostic and therapeutic right thoracentesis performed yielding 0.28 liters of serosanguineous colored fluid. No immediate complications. Follow-up chest x-ray pending.      Elfrida Pixley E 2:42 PM 05/05/2016

## 2016-05-05 NOTE — Progress Notes (Signed)
PROGRESS NOTE  Alexis Cobb YHC:[id number] DOB: 1924-07-09 DOA: 04/30/2016 PCP: Oliver Barre, MD  Brief History:  82 y.o.femalewith medical history significant of mitral valve repair, pacemaker placement, HTN, and HLD presenting after she slipped and fell in the bedroom. She niece (an Charity fundraiser) had called her about 115 today. She didn't want to go see doctor despite sounding horrible. The patient had a scratchy throat Wednesday, but the niece didn't talk to her yesterday. The patient usually gets hair done Friday, but didn't feel well enough today to get hair done. The niece called her sister (a speech therapist), who went to see her; the patientwouldn't answer the door. The patient reports that she slipped and fell; shepressed her life alert - the companynotified the RN niece'shusband and he called her. She arrived at the same time as the ambulance. The patientwas between 2 twin beds, had on socks but no slippers on hardwood floors. Unable to get up. The niece last spoke with her at 67, arrived at 49. Family reports that the patient is very pale. She has had diarrhea for the last 2 days, had stool incontinence when they found herand she has had 3 watery stools while in the ER. The patient states that she has never had such a severe sore throat before. No headache. No cough, no fever. Has not been feeling SOB. Admitted for Respiratory Failure, Generalized Weakness and Debility, and Diarrhea. Found to have Parainfluenza 1 Virus.   Assessment/Plan: Acute respiratory failure with hypoxia -Secondary to pleural effusions and aspiration pneumonitis in the setting of parainfluenza virus infection -Completed 5 days of levofloxacin 05/05/2016 -Patient was stable on 2 L nasal cannula -05/04/2016 CT abdomen and pelvis--bilateral moderate pleural effusions with bibasilar consolidations -Pulmonary hygiene -Flutter valve -Patient initially refused thoracocentesis--now  agreeable after long discussion of risks, benefits, and alternatives  recontacted IR  Pleural effusions -re-attempt thoracocentesis -send fluid for protein, LDH, glucose, cultures -Echocardiogram  Diarrhea with rectal and sigmoid colon thickening -consult GI -diarrhea seems to be improving--may have been due to viral infection  Acute blood loss anemia -Secondary to iliac as hemorrhage from mechanical fall -Continue aspirin for now -Monitor hemoglobin -05/04/2016 CT abdomen pelvis--right iliac as hemorrhage with focal hematoma lateral to the right trochanter with scattered small hematomas throughout the pelvic area -Transfused 2 units PRBC   Multiple pelvic fractures -Dr. Marland Mcalpine discussed with ortho, Dr. Lauree Chandler, nonoperative management -secondary to mechanical fall  Generalized weakness -Urinalysis is negative for pyuria -CT brain negative for acute intracranial findings -Check TSH -Serum B12 -PT eval  Hypertension -Continue metoprolol succinate and losartan -Controlled  Hyperlipidemia -Continue pravastatin    Disposition Plan:   SNF when medically stable--not ready for d/c Family Communication:   Family at bedside updated--Total time spent 35 minutes.  Greater than 50% spent face to face counseling and coordinating care.   Consultants:  [facility]  Code Status:  FULL  DVT Prophylaxis:  SCDs   Procedures: As Listed in Progress Note Above  Antibiotics: None    Subjective: Patient complaint some shortness of breath. She denies any fevers, chills, headache, chest pain, nausea, vomiting, diarrhea, abdominal pain. No dysuria or hematuria. She complains of pelvic pain from her fractures.  Objective: Vitals:   05/04/16 1650 05/04/16 1927 05/04/16 2105 05/05/16 0424  BP: (!) 131/58 139/75 132/71 127/63  Pulse: 74 74 72 71  Resp: 18 18 16 16   Temp: 97.9 F (36.6 C) 97.5 F (36.4 C) 98.5  F (36.9 C) 98.6 F (37 C)  TempSrc: Oral Oral Oral Oral    SpO2: 99% 100% 99% 98%  Weight:      Height:        Intake/Output Summary (Last 24 hours) at 05/05/16 1246 Last data filed at 05/05/16 0426  Gross per 24 hour  Intake              685 ml  Output             1225 ml  Net             -540 ml   Weight change:  Exam:   General:  Pt is alert, follows commands appropriately, not in acute distress  HEENT: No icterus, No thrush, No neck mass, [place]/AT  Cardiovascular: RRR, S1/S2, no rubs, no gallops  Respiratory: Diminished breath sounds bilateral. No wheezing.  Abdomen: Soft/+BS, non tender, non distended, no guarding  Extremities: trace LE edema, No lymphangitis, No petechiae, No rashes, no synovitis   Data Reviewed: I have personally reviewed following labs and imaging studies Basic Metabolic Panel:  Recent Labs Lab 05/01/16 0219 05/02/16 0551 05/03/16 0600 05/04/16 0248 05/05/16 0542  NA 135 136 138 141 143  K 3.5 3.1* 3.7 3.7 3.8  CL 102 103 106 110 109  CO2 24 26 23  21* 27  GLUCOSE 158* 108* 81 77 114*  BUN 13 13 17  21* 21*  CREATININE 0.77 0.67 0.77 0.72 0.64  CALCIUM 8.4* 8.4* 8.2* 8.6* 8.5*  MG  --  1.6* 1.9 1.9 1.8  PHOS  --  2.7 2.5 2.8 2.1*   Liver Function Tests:  Recent Labs Lab 04/30/16 2027 05/02/16 0551 05/03/16 0600 05/04/16 0248 05/05/16 0542  AST 39 49* 40 40 37  ALT 23 20 16 19 18   ALKPHOS 54 41 31* 35* 39  BILITOT 0.8 0.8 0.8 1.4* 1.8*  PROT 6.3* 5.3* 4.5* 4.6* 4.7*  ALBUMIN 3.6 3.1* 2.4* 2.5* 2.5*   No results for input(s): LIPASE, AMYLASE in the last 168 hours. No results for input(s): AMMONIA in the last 168 hours. Coagulation Profile: No results for input(s): INR, PROTIME in the last 168 hours. CBC:  Recent Labs Lab 04/30/16 2027 05/01/16 0219 05/02/16 0551 05/03/16 0600 05/03/16 0943 05/03/16 1456 05/03/16 2017 05/04/16 0248 05/05/16 0542  WBC 6.4 7.8 11.1* 8.6  --   --   --  8.5 8.0  NEUTROABS 4.8  --  8.8* 6.5  --   --   --  6.4 5.6  HGB 13.5 13.0 11.8* 8.8*  8.6* 8.6* 8.8* 8.0*  7.8* 10.0*  HCT 41.1 39.2 34.5* 25.9* 25.5* 24.8* 26.6* 23.5*  22.9* 29.8*  MCV 97.9 97.3 95.3 95.6  --   --   --  96.3 87.9  PLT 147* 126* 127* 103*  --   --   --  123* 140*   Cardiac Enzymes: No results for input(s): CKTOTAL, CKMB, CKMBINDEX, TROPONINI in the last 168 hours. BNP: Invalid input(s): POCBNP CBG: No results for input(s): GLUCAP in the last 168 hours. HbA1C: No results for input(s): HGBA1C in the last 72 hours. Urine analysis:    Component Value Date/Time   COLORURINE YELLOW 04/30/2016 2338   APPEARANCEUR HAZY (A) 04/30/2016 2338   LABSPEC 1.013 04/30/2016 2338   PHURINE 8.0 04/30/2016 2338   GLUCOSEU NEGATIVE 04/30/2016 2338   GLUCOSEU NEGATIVE 12/12/2013 1230   HGBUR NEGATIVE 04/30/2016 2338   BILIRUBINUR NEGATIVE 04/30/2016 2338   KETONESUR 5 (A) 04/30/2016 2338  PROTEINUR 30 (A) 04/30/2016 2338   UROBILINOGEN 0.2 12/12/2013 1230   NITRITE NEGATIVE 04/30/2016 2338   LEUKOCYTESUR NEGATIVE 04/30/2016 2338   Sepsis Labs: @LABRCNTIP (procalcitonin:4,lacticidven:4) ) Recent Results (from the past 240 hour(s))  Rapid strep screen (not at [facility])     Status: None   Collection Time: 05/01/16  1:52 AM  Result Value Ref Range Status   Streptococcus, Group A Screen (Direct) NEGATIVE NEGATIVE Final    Comment: (NOTE) A Rapid Antigen test may result negative if the antigen level in the sample is below the detection level of this test. The FDA has not cleared this test as a stand-alone test therefore the rapid antigen negative result has reflexed to a Group A Strep culture.   Culture, group A strep     Status: None   Collection Time: 05/01/16  1:52 AM  Result Value Ref Range Status   Specimen Description THROAT  Final   Special Requests NONE Reflexed from [id number]  Final   Culture   Final    NO GROUP A STREP (S.PYOGENES) ISOLATED Performed at [facility]    Report Status 05/03/2016 FINAL  Final  Culture, blood (routine x 2) Call MD  if unable to obtain prior to antibiotics being given     Status: None (Preliminary result)   Collection Time: 05/01/16  2:19 AM  Result Value Ref Range Status   Specimen Description BLOOD RIGHT ARM  Final   Special Requests BOTTLES DRAWN AEROBIC ONLY 7 CC  Final   Culture   Final    NO GROWTH 3 DAYS Performed at [facility]    Report Status PENDING  Incomplete  Culture, blood (routine x 2) Call MD if unable to obtain prior to antibiotics being given     Status: None (Preliminary result)   Collection Time: 05/01/16  2:19 AM  Result Value Ref Range Status   Specimen Description BLOOD RIGHT WRIST  Final   Special Requests BOTTLES DRAWN AEROBIC AND ANAEROBIC 5 CC  Final   Culture   Final    NO GROWTH 3 DAYS Performed at [facility]    Report Status PENDING  Incomplete  Respiratory Panel by PCR     Status: Abnormal   Collection Time: 05/01/16  4:04 PM  Result Value Ref Range Status   Adenovirus NOT DETECTED NOT DETECTED Final   Coronavirus 229E NOT DETECTED NOT DETECTED Final   Coronavirus HKU1 NOT DETECTED NOT DETECTED Final   Coronavirus NL63 NOT DETECTED NOT DETECTED Final   Coronavirus OC43 NOT DETECTED NOT DETECTED Final   Metapneumovirus NOT DETECTED NOT DETECTED Final   Rhinovirus / Enterovirus NOT DETECTED NOT DETECTED Final   Influenza A NOT DETECTED NOT DETECTED Final   Influenza B NOT DETECTED NOT DETECTED Final   Parainfluenza Virus 1 DETECTED (A) NOT DETECTED Final   Parainfluenza Virus 2 NOT DETECTED NOT DETECTED Final   Parainfluenza Virus 3 NOT DETECTED NOT DETECTED Final   Parainfluenza Virus 4 NOT DETECTED NOT DETECTED Final   Respiratory Syncytial Virus NOT DETECTED NOT DETECTED Final   Bordetella pertussis NOT DETECTED NOT DETECTED Final   Chlamydophila pneumoniae NOT DETECTED NOT DETECTED Final   Mycoplasma pneumoniae NOT DETECTED NOT DETECTED Final    Comment: Performed at [facility]     Scheduled Meds: . sodium chloride    Intravenous Once  . aspirin EC  81 mg Oral QHS  . chlorhexidine  15 mL Mouth/Throat BID  . losartan  100 mg Oral  Daily  . metoprolol succinate  75 mg Oral Daily  . pravastatin  40 mg Oral q1800   Continuous Infusions:  Procedures/Studies: Ct Abdomen Pelvis Wo Contrast  Result Date: 05/04/2016 CLINICAL DATA:  Recent fall.  Diarrhea.  Drop in hemoglobin EXAM: CT ABDOMEN AND PELVIS WITHOUT CONTRAST TECHNIQUE: Multidetector CT imaging of the abdomen and pelvis was performed following the standard protocol without IV contrast. A small amount of oral contrast is evident. COMPARISON:  None. FINDINGS: Lower chest: There are moderate pleural effusions bilaterally with bibasilar consolidation. No pneumothorax evident in the visualized lung regions. Pacemaker leads are attached to the right atrium and right ventricle. There are foci of coronary artery calcification. Visualized pericardium is not appreciably thickened. Hepatobiliary: No focal liver lesions are evident on this noncontrast enhanced study. There is no very hepatic fluid. No liver laceration or rupture is evident. Gallbladder wall is not appreciably thickened. There is no biliary duct dilatation. Pancreas: There is no pancreatic mass or inflammatory focus. No peripancreatic fluid or inflammation is evident. Spleen: The spleen appears intact on this noncontrast enhanced study. No splenic laceration or rupture is evident. No splenic mass or perisplenic fluid. Adrenals/Urinary Tract: Adrenals appear unremarkable bilaterally. There is scarring in the left kidney, particularly in the upper pole region. There is no renal mass on either side. There is perinephric stranding on the right. There is no hydronephrosis on either side. In the left kidney, there is a 4 mm calculus with an adjacent 3 mm calculus. In the right kidney midportion, there is a 1.0 x 0.8 cm calculus. There are no ureteral calculi on either side. Urinary bladder contains a Foley catheter. A  small amount of air in the urinary bladder is likely due to instrumentation. The wall of the urinary bladder appears mildly thickened. Stomach/Bowel: There is soft tissue stranding in the perirectal region with moderate fluid in this area. Similar changes are noted in the distal sigmoid colon. There are multiple sigmoid diverticula without diverticulitis. There is no bowel wall thickening elsewhere. No bowel obstruction. No evident free air or portal venous air. Vascular/Lymphatic: There is extensive atherosclerotic calcification in the aorta and common iliac arteries. There is no appreciable abdominal aortic aneurysm. Major mesenteric vessels appear patent on this noncontrast enhanced study. There is no adenopathy in the abdomen or pelvis. Reproductive: Uterus is absent. There is no pelvic mass. There is no free fluid in the pelvis, although there is fluid in the perirectal region. Other: Appendix appears normal. No abscess is evident in the abdomen or pelvis. There is a right femoral hernia which contains only fat. Musculoskeletal: There is hemorrhage in the right iliacus muscle with diffuse enlargement of the right iliacus muscle compared to its normal appearing counterpart on the left. Slightly superiorly 8 this muscle, there is stranding in the perinephric region which probably is due to hemorrhage which arose from this area in the pelvis/retroperitoneum. In addition, there is extensive soft tissue stranding throughout the right lateral abdominal and pelvic walls. There is a focal well-defined hematoma lateral to the right greater trochanter measuring 6.6 x 3.3 x 2.8 cm. Smaller apparent hematomas are noted throughout the area of trauma as well. A lesser degree of thickening is noted in the left lateral pelvic wall. Bones are diffusely osteoporotic. There is evidence of a fracture of the lateral right ischium at the junction with the right acetabulum there is an impacted fracture of the medial right pubic  symphysis. There is a fracture of the mid right ischium  as well as a fracture of the anterior left ischium near the pubic symphysis. There is a fracture of the superior right sacral ala with subtle impaction in this area. IMPRESSION: Multiple pelvic fractures in near anatomic alignment. There is moderate hemorrhage throughout the right iliacus muscle. Without intravenous contrast, is difficult to exclude active hemorrhage, although the appearance is consistent with hematoma throughout this muscle. There is nearby stranding in the soft tissues along the right retroperitoneum/upper pelvis extending to the lateral right perinephric region. There is no impression on the right kidney. There is extensive abnormality in the soft tissues along the lateral abdominal and pelvic walls bilaterally, more severe on the right than on the left. There are hematomas which are well-defined lateral to the right greater trochanter. There is no air-fluid level suggesting abscess. There are moderate pleural effusions bilaterally with bibasilar lung consolidation. No pneumothorax evident in visualized regions. Calculi in each kidney. Areas of scarring and atrophy in left kidney. No hydronephrosis. Note that there is thickening of the urinary bladder wall in spite of catheterization. Suspect a degree of cystitis. Fluid in the perirectal region and distal sigmoid colon region with soft tissue stranding in this area. This finding may be due to a degree of proctitis/distal colitis. Trauma involving the rectum cannot be excluded, although there is no ectopic air in this area or oral contrast extravasation. This finding may warrant sigmoidoscopy and proctoscopy to directly visualize these areas an assess for potential localized trauma. There are sigmoid diverticula without diverticulitis. No bowel wall thickening elsewhere noted. There is aortoiliac atherosclerosis. Aorta appears intact. There is no periaortic or major vessel perivascular fluid.  There is a right femoral hernia containing only fat. Electronically Signed   By: Bretta Bang III M.D.   On: 05/04/2016 10:45   Dg Chest 2 View  Result Date: 04/30/2016 CLINICAL DATA:  82 year old female with fall EXAM: CHEST  2 VIEW COMPARISON:  Chest radiograph dated 09/05/2008 FINDINGS: There is emphysematous changes of the lungs. No focal consolidation, pleural effusion, or pneumothorax. The cardiac silhouette is within normal limits. The aorta is mildly tortuous. Median sternotomy wires and left pectoral dual lead pacemaker device noted. There is osteopenia with degenerative changes of the spine. No acute fracture. IMPRESSION: No acute cardiopulmonary process. Emphysema. Electronically Signed   By: Elgie Collard M.D.   On: 04/30/2016 23:43   Dg Pelvis 1-2 Views  Result Date: 04/30/2016 CLINICAL DATA:  Larey Seat at home at 17:00 today. Generalized weakness for several days. EXAM: PELVIS - 1-2 VIEW COMPARISON:  None. FINDINGS: There is no evidence of pelvic fracture or diastasis. No pelvic bone lesions are seen. IMPRESSION: Negative. Electronically Signed   By: Ellery Plunk M.D.   On: 04/30/2016 23:43   Ct Head Wo Contrast  Result Date: 04/30/2016 CLINICAL DATA:  Larey Seat at 17:00 today. Generalized weakness for several days. EXAM: CT HEAD WITHOUT CONTRAST TECHNIQUE: Contiguous axial images were obtained from the base of the skull through the vertex without intravenous contrast. COMPARISON:  None. FINDINGS: Brain: There is no intracranial hemorrhage, mass or evidence of acute infarction. There is moderate generalized atrophy. There is moderate chronic microvascular ischemic change. There is no significant extra-axial fluid collection. No acute intracranial findings are evident. Vascular: No hyperdense vessel or unexpected calcification. Skull: Normal. Negative for fracture or focal lesion. Sinuses/Orbits: No acute finding. Other: None. IMPRESSION: No acute intracranial findings. There is  moderate generalized atrophy and chronic appearing white matter hypodensities which likely represent small vessel ischemic disease. Electronically  Signed   By: Ellery Plunk M.D.   On: 04/30/2016 23:29   [place] Chest  Result Date: 05/05/2016 CLINICAL DATA:  Pleural effusion. EXAM: CHEST ULTRASOUND COMPARISON:  Chest x-ray 04/30/2016 . FINDINGS: Small bilateral pleural effusions are noted. Thoracentesis was not performed because patient was unable to sit up or lay on her side. IMPRESSION: Small bilateral pleural effusions. Thoracentesis not performed as above . Electronically Signed   By: Maisie Fus  Register   On: 05/05/2016 10:35   Dg Swallowing Func-speech Pathology  Result Date: 05/01/2016 Objective Swallowing Evaluation: Type of Study: MBS-Modified Barium Swallow Study Patient Details Name: Alexis Cobb MRN: [id number] Date of Birth: Sep 06, 1924 Today's Date: 05/01/2016 Time: SLP Start Time (ACUTE ONLY): 1440-SLP Stop Time (ACUTE ONLY): 1510 SLP Time Calculation (min) (ACUTE ONLY): 30 min Past Medical History: Past Medical History: Diagnosis Date . Arthritis  . ARTHRITIS, RHEUMATOID, HX OF 01/06/2007 . Cervical stenosis of spine  . HTN (hypertension)  . Hyperlipidemia  . MITRAL REGURGITATION 01/06/2007 . OSTEOARTHRITIS 01/14/2007 . OSTEOPOROSIS 01/14/2007 . Other specified forms of hearing loss 08/01/2009 . PACEMAKER, PERMANENT 01/06/2007 . Raynaud's syndrome 01/06/2007 . Past Surgical History: Past Surgical History: Procedure Laterality Date . ABDOMINAL HYSTERECTOMY   . APPENDECTOMY   . CATARACT EXTRACTION, BILATERAL   . MITRAL VALVE REPAIR   . ovarian tumor, benign   . PACEMAKER INSERTION   HPI: 82 year old female admitted s/p fall at home, presenting with respiratory failure with hypoxia, question early developing aspiration PNA, severe throat, diarrhea. CXR negative. No Data Recorded Assessment / Plan / Recommendation CHL IP CLINICAL IMPRESSIONS 05/01/2016 Therapy Diagnosis Severe pharyngeal phase dysphagia  Clinical Impression MBS complete. Patient presents with a severe pharyngeal dysphagia characterized by gross weakness resulting in decreased pharyngeal clearance, severe pharyngeal residuals, and decreased airway protection across consistencies. Patient unable to utilize compensatory strategies in attempts to facilitate decreased penetration/aspiration due to AMS. Unclear origin of dysphagia at this time. Question some degree of baseline dysphagia, now exacerbated by acute condition and/or relation to sore throat. Education complete with patient's neice. Will continue to f/u for differential diagnosis, readiness for pos at bedside.  Impact on safety and function Severe aspiration risk   CHL IP TREATMENT RECOMMENDATION 05/01/2016 Treatment Recommendations Therapy as outlined in treatment plan below   Prognosis 05/01/2016 Prognosis for Safe Diet Advancement Good Barriers to Reach Goals Severity of deficits Barriers/Prognosis Comment -- CHL IP DIET RECOMMENDATION 05/01/2016 SLP Diet Recommendations NPO;Alternative means - temporary Liquid Administration via -- Medication Administration Via alternative means Compensations -- Postural Changes --   CHL IP OTHER RECOMMENDATIONS 05/01/2016 Recommended Consults -- Oral Care Recommendations Oral care QID Other Recommendations --   CHL IP FOLLOW UP RECOMMENDATIONS 05/01/2016 Follow up Recommendations Other (comment)   CHL IP FREQUENCY AND DURATION 05/01/2016 Speech Therapy Frequency (ACUTE ONLY) min 3x week Treatment Duration 2 weeks      CHL IP ORAL PHASE 05/01/2016 Oral Phase WFL Oral - Pudding Teaspoon -- Oral - Pudding Cup -- Oral - Honey Teaspoon -- Oral - Honey Cup -- Oral - Nectar Teaspoon -- Oral - Nectar Cup -- Oral - Nectar Straw -- Oral - Thin Teaspoon -- Oral - Thin Cup -- Oral - Thin Straw -- Oral - Puree -- Oral - Mech Soft -- Oral - Regular -- Oral - Multi-Consistency -- Oral - Pill -- Oral Phase - Comment --  CHL IP PHARYNGEAL PHASE 05/01/2016 Pharyngeal Phase  Impaired Pharyngeal- Pudding Teaspoon -- Pharyngeal -- Pharyngeal- Pudding Cup -- Pharyngeal -- Pharyngeal-  Honey Teaspoon -- Pharyngeal -- Pharyngeal- Honey Cup -- Pharyngeal -- Pharyngeal- Nectar Teaspoon Delayed swallow initiation-vallecula;Delayed swallow initiation-pyriform sinuses;Reduced pharyngeal peristalsis;Reduced epiglottic inversion;Reduced anterior laryngeal mobility;Reduced laryngeal elevation;Reduced airway/laryngeal closure;Reduced tongue base retraction;Penetration/Aspiration before swallow;Penetration/Aspiration during swallow;Penetration/Apiration after swallow;Moderate aspiration;Pharyngeal residue - valleculae;Pharyngeal residue - pyriform Pharyngeal Material enters airway, passes BELOW cords without attempt by patient to eject out (silent aspiration) Pharyngeal- Nectar Cup -- Pharyngeal -- Pharyngeal- Nectar Straw -- Pharyngeal -- Pharyngeal- Thin Teaspoon Delayed swallow initiation-vallecula;Delayed swallow initiation-pyriform sinuses;Reduced pharyngeal peristalsis;Reduced epiglottic inversion;Reduced anterior laryngeal mobility;Reduced laryngeal elevation;Reduced airway/laryngeal closure;Reduced tongue base retraction;Penetration/Aspiration before swallow;Penetration/Aspiration during swallow;Penetration/Apiration after swallow;Moderate aspiration;Pharyngeal residue - valleculae;Pharyngeal residue - pyriform Pharyngeal Material enters airway, passes BELOW cords without attempt by patient to eject out (silent aspiration) Pharyngeal- Thin Cup -- Pharyngeal -- Pharyngeal- Thin Straw -- Pharyngeal -- Pharyngeal- Puree Delayed swallow initiation-vallecula;Delayed swallow initiation-pyriform sinuses;Reduced pharyngeal peristalsis;Reduced epiglottic inversion;Reduced anterior laryngeal mobility;Reduced laryngeal elevation;Reduced airway/laryngeal closure;Reduced tongue base retraction;Penetration/Aspiration before swallow;Penetration/Aspiration during swallow;Penetration/Apiration after  swallow;Pharyngeal residue - valleculae;Pharyngeal residue - pyriform Pharyngeal Material enters airway, CONTACTS cords and not ejected out Pharyngeal- Mechanical Soft -- Pharyngeal -- Pharyngeal- Regular -- Pharyngeal -- Pharyngeal- Multi-consistency -- Pharyngeal -- Pharyngeal- Pill -- Pharyngeal -- Pharyngeal Comment --  Ferdinand Lango MA, CCC-SLP [phone] McCoy Leah Meryl 05/01/2016, 3:26 PM               Torrell Krutz, DO  [facility] Pager [phone]  If 7PM-7AM, please contact night-coverage [email or web address] Password [email or web address] 05/05/2016, 12:46 PM   LOS: 4 days

## 2016-05-05 NOTE — Progress Notes (Signed)
Pleural fluid consistent with transudate -pt is +3L -echo ordered -daily weights--doubt pt able to stand--will have to be bed weight -lasix 20 mg IV x 1 -pt on lasix 20 mg po daily at home -am CBC, BMP  DTat

## 2016-05-06 ENCOUNTER — Inpatient Hospital Stay (HOSPITAL_COMMUNITY): Payer: Medicare Other

## 2016-05-06 DIAGNOSIS — Z9889 Other specified postprocedural states: Secondary | ICD-10-CM

## 2016-05-06 DIAGNOSIS — J90 Pleural effusion, not elsewhere classified: Secondary | ICD-10-CM

## 2016-05-06 LAB — BASIC METABOLIC PANEL
Anion gap: 7 (ref 5–15)
BUN: 19 mg/dL (ref 6–20)
CHLORIDE: 104 mmol/L (ref 101–111)
CO2: 31 mmol/L (ref 22–32)
CREATININE: 0.65 mg/dL (ref 0.44–1.00)
Calcium: 8.5 mg/dL — ABNORMAL LOW (ref 8.9–10.3)
GFR calc non Af Amer: >60 mL/min (ref >60)
Glucose, Bld: 104 mg/dL — ABNORMAL HIGH (ref 65–99)
POTASSIUM: 3.4 mmol/L — AB (ref 3.5–5.1)
Sodium: 142 mmol/L (ref 135–145)

## 2016-05-06 LAB — CBC
HCT: 33 % — ABNORMAL LOW (ref 36.0–46.0)
Hemoglobin: 11 g/dL — ABNORMAL LOW (ref 12.0–15.0)
MCH: 30.8 pg (ref 26.0–34.0)
MCHC: 33.3 g/dL (ref 30.0–36.0)
MCV: 92.4 fL (ref 78.0–100.0)
PLATELETS: 197 10*3/uL (ref 150–400)
RBC: 3.57 MIL/uL — AB (ref 3.87–5.11)
RDW: 18.9 % — AB (ref 11.5–15.5)
WBC: 9.6 10*3/uL (ref 4.0–10.5)

## 2016-05-06 LAB — CULTURE, BLOOD (ROUTINE X 2)
CULTURE: NO GROWTH
Culture: NO GROWTH

## 2016-05-06 LAB — ECHOCARDIOGRAM COMPLETE
Height: 63 in
WEIGHTICAEL: 1648 [oz_av]

## 2016-05-06 LAB — TSH: TSH: 1.094 u[IU]/mL (ref 0.350–4.500)

## 2016-05-06 LAB — VITAMIN B12: VITAMIN B 12: 2697 pg/mL — AB (ref 180–914)

## 2016-05-06 LAB — BRAIN NATRIURETIC PEPTIDE: B NATRIURETIC PEPTIDE 5: 293.6 pg/mL — AB (ref 0.0–100.0)

## 2016-05-06 MED ORDER — FUROSEMIDE 40 MG PO TABS
40.0000 mg | ORAL_TABLET | Freq: Every day | ORAL | Status: DC
  Administered 2016-05-07: 40 mg via ORAL
  Filled 2016-05-06: qty 1

## 2016-05-06 MED ORDER — POTASSIUM CHLORIDE CRYS ER 20 MEQ PO TBCR
20.0000 meq | EXTENDED_RELEASE_TABLET | Freq: Every day | ORAL | Status: DC
  Administered 2016-05-06 – 2016-05-07 (×2): 20 meq via ORAL
  Filled 2016-05-06 (×2): qty 1

## 2016-05-06 MED ORDER — FUROSEMIDE 20 MG PO TABS
20.0000 mg | ORAL_TABLET | Freq: Every day | ORAL | Status: DC
  Administered 2016-05-06: 20 mg via ORAL
  Filled 2016-05-06: qty 1

## 2016-05-06 MED ORDER — STARCH (THICKENING) PO POWD: ORAL | 0 refills | Status: DC

## 2016-05-06 MED ORDER — FUROSEMIDE 20 MG PO TABS
20.0000 mg | ORAL_TABLET | Freq: Once | ORAL | Status: AC
  Administered 2016-05-06: 20 mg via ORAL
  Filled 2016-05-06: qty 1

## 2016-05-06 NOTE — Clinical Social Work Note (Signed)
Clinical Social Work Assessment  Patient Details  Name: Alexis Cobb MRN: [id number] Date of Birth: Mar 04, 1925  Date of referral:  05/06/16               Reason for consult:  Facility Placement                Permission sought to share information with:  Facility Sport and exercise psychologist, Family Supports Permission granted to share information::  No  Name::     Asencion Noble, Fuller Mandril, Sherryl Barters  Agency::  SNF  Relationship::  Williemae Natter  Contact Information:  [phone], [phone]  Housing/Transportation Living arrangements for the past 2 months:  Single Family Home Source of Information:  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  Yes Need for family participation in patient care:  Yes (Comment)  Care giving concerns: Family is concerned about patient ability to care for self at home.    Social Worker assessment / plan:  LCSWA met with patient and family at bedside. Patient alert and oriented. Family expressed the need for patient to go to SNF before returning home. The patient agreeable to rehab. Patient agreeable to [place].  FL2 completed. Assessment completed.   Employment status:    Insurance information:  Medicare PT Recommendations:  [place] / Referral to community resources:  [place]  Patient/Family's Response to care:  Agreeable and responding well to care.   Patient/Family's Understanding of and Emotional Response to Diagnosis, Current Treatment, and Prognosis:  Patient/ Family understands, patient reports she is able to make her own decisions.   Emotional Assessment Appearance:  Appears stated age Attitude/Demeanor/Rapport:    Affect (typically  observed):  Accepting Orientation:  Oriented to Self, Oriented to Place, Oriented to Situation Alcohol / Substance use:  Not Applicable Psych involvement (Current and /or in the community):  No (Comment)  Discharge Needs  Concerns to be addressed:  Care Coordination, Discharge Planning Concerns Readmission within the last 30 days:  No Current discharge risk:  None Barriers to Discharge:  Continued Medical Work up   [employer], LCSW 05/06/2016, 12:40 PM

## 2016-05-06 NOTE — Progress Notes (Signed)
  Echocardiogram 2D Echocardiogram has been performed.  Leta Jungling M 05/06/2016, 8:42 AM

## 2016-05-06 NOTE — Discharge Summary (Signed)
Physician Discharge Summary  Alexis Cobb RXV:[id number] DOB: 1925-05-01 DOA: 04/30/2016  PCP: Oliver Barre, MD  Admit date: 04/30/2016 Discharge date: 05/07/16  Admitted From: Home Disposition:  SNF  Recommendations for Outpatient Follow-up:  1. Follow up with PCP in 1-2 weeks 2. Please obtain BMP/CBC in one week 3. Wean oxygen off for oxygen saturation >92%    Discharge Condition:stable CODE STATUS:FULL Diet recommendation: Dysphagia 1 with nectar thickened liquid   Brief/Interim Summary: 82 y.o.femalewith medical history significant of mitral valve repair, pacemaker placement, HTN, and HLD presenting after she slipped and fell in the bedroom.  The patient had a scratchy throat Wed 04/29/16. The patient usually gets hair done Friday, but didn't feel well enough today to get hair done. The niece called her sister (a speech therapist), who went to see her; the patientwouldn't answer the door. The patient reports that she slipped and fell; shepressed her life alert - the companynotified the RN niece'shusband and he called her. She arrived at the same time as the ambulance. The patientwas between 2 twin beds, had on socks but no slippers on hardwood floors. Unable to get up. Family reports that the patient is very pale. She has had diarrhea for the last 2 days prior to admission, had stool incontinence when they found herand she has had 3 watery stools while in the ER. No headache. No cough, no fever.  Admitted for Respiratory Failure, Generalized Weakness and Debility, and Diarrhea. Found to have Parainfluenza 1 Virus. CT of the abdomen and pelvis revealed multiple pelvic fractures with scattered pelvic hemorrhages and hematomas. The patient was started on levofloxacin for presumptive aspiration pneumonia. Speech therapy was asked to evaluate the patient and recommended a dysphagia 1 diet with nectar thickened liquids.  Discharge Diagnoses:  Acute respiratory  failure with hypoxia -Secondary to pleural effusions and aspiration pneumonitis in the setting of parainfluenza virus infection -Completed 5 days of levofloxacin 05/05/2016 -Patient stable on 1 L nasal cannula -05/04/2016 CT abdomen and pelvis--bilateral moderate pleural effusions with bibasilar consolidations -Pulmonary hygiene -Flutter valve -05/05/16 thoracocentesis--0.28L removed-->transudate-->lasix IV given, then transitioned to po lasix  Pleural effusions--transudate -re-attempt thoracocentesis after pt initially refused on 12/27 -Echocardiogram--EF 60-65%, PASP 51, mild-to-moderate TR, no WMA -continue lasix po--d/c with lasix 20 mg once daily  Diarrhea with rectal and sigmoid colon thickening -consult GI--patient did not want further work up-->follow up out pt -diarrhea seems to be improving--may have been due to viral infection -resolved at time of d/c  Acute blood loss anemia -Secondary to iliacus/pelvic hemorrhage from mechanical fall -Continue aspirin for now -Monitor hemoglobin--stable -05/04/2016 CT abdomen pelvis--right iliac as hemorrhage with focal hematoma lateral to the right trochanter with scattered small hematomas throughout the pelvic area -Transfused 2 units PRBCduring admission--Hgb stable thereafter -Hgb 10.7 on day of d/c -repeat CBC in one week  Multiple pelvic fractures -Dr. Marland Mcalpine discussed with ortho, Dr. Lauree Chandler, nonoperative management-->WBAT -secondary to mechanical fall  Generalized weakness -Urinalysis is negative for pyuria -CT brain negative for acute intracranial findings -Check TSH--1.094 -Serum B12--2697 -PT eval--SNF  Hypertension -Continue metoprolol succinate and losartan -Controlled  Hyperlipidemia -Continue pravastatin   Discharge Instructions  Discharge Instructions    Diet - low sodium heart healthy    Complete by:  As directed    Increase activity slowly    Complete by:  As directed       Allergies as of 05/07/2016      Reactions   Ace Inhibitors Rash   Cephalexin Rash   Penicillins  Rash, Other (See Comments)   Has patient had a PCN reaction causing immediate rash, facial/tongue/throat swelling, SOB or lightheadedness with hypotension: No Has patient had a PCN reaction causing severe rash involving mucus membranes or skin necrosis: No Has patient had a PCN reaction that required hospitalization No Has patient had a PCN reaction occurring within the last 10 years: No If all of the above answers are "NO", then may proceed with Cephalosporin use.   Risedronate Sodium Rash      Medication List    TAKE these medications   aspirin EC 81 MG tablet Take 81 mg by mouth at bedtime.   CALCIUM 600+D 600-400 MG-UNIT tablet Generic drug:  Calcium Carbonate-Vitamin D Take 1 tablet by mouth 2 (two) times daily.   cholecalciferol 1000 units tablet Commonly known as:  VITAMIN D Take 1,000 Units by mouth daily.   food thickener Powd Commonly known as:  THICK IT Use to thicken liquids to nectar thickened consistency   furosemide 20 MG tablet Commonly known as:  LASIX Take 20 mg by mouth daily as needed for edema.   losartan 100 MG tablet Commonly known as:  COZAAR Take 100 mg by mouth daily.   lovastatin 40 MG tablet Commonly known as:  MEVACOR Take 40 mg by mouth at bedtime.   metoprolol succinate 50 MG 24 hr tablet Commonly known as:  TOPROL-XL Take 75 mg by mouth daily. Take with or immediately following a meal.   multivitamin with minerals Tabs tablet Take 1 tablet by mouth daily.   nitroGLYCERIN 0.4 MG SL tablet Commonly known as:  NITROSTAT Place 0.4 mg under the tongue every 5 (five) minutes as needed for chest pain.      Contact information for after-discharge care    Destination    [facility] .   Specialty:  Skilled Nursing Facility Contact information: [address] [place]  [phone]             Allergies  Allergen Reactions  . Ace Inhibitors Rash  . Cephalexin Rash  . Penicillins Rash and Other (See Comments)    Has patient had a PCN reaction causing immediate rash, facial/tongue/throat swelling, SOB or lightheadedness with hypotension: No Has patient had a PCN reaction causing severe rash involving mucus membranes or skin necrosis: No Has patient had a PCN reaction that required hospitalization No Has patient had a PCN reaction occurring within the last 10 years: No If all of the above answers are "NO", then may proceed with Cephalosporin use.  Marland Kitchen. Risedronate Sodium Rash    Consultations:  none   Procedures/Studies: Ct Abdomen Pelvis Wo Contrast  Result Date: 05/04/2016 CLINICAL DATA:  Recent fall.  Diarrhea.  Drop in hemoglobin EXAM: CT ABDOMEN AND PELVIS WITHOUT CONTRAST TECHNIQUE: Multidetector CT imaging of the abdomen and pelvis was performed following the standard protocol without IV contrast. A small amount of oral contrast is evident. COMPARISON:  None. FINDINGS: Lower chest: There are moderate pleural effusions bilaterally with bibasilar consolidation. No pneumothorax evident in the visualized lung regions. Pacemaker leads are attached to the right atrium and right ventricle. There are foci of coronary artery calcification. Visualized pericardium is not appreciably thickened. Hepatobiliary: No focal liver lesions are evident on this noncontrast enhanced study. There is no very hepatic fluid. No liver laceration or rupture is evident. Gallbladder wall is not appreciably thickened. There is no biliary duct dilatation. Pancreas: There is no pancreatic mass or inflammatory focus. No peripancreatic fluid  or inflammation is evident. Spleen: The spleen appears intact on this noncontrast enhanced study. No splenic laceration or rupture is evident. No splenic mass or perisplenic fluid. Adrenals/Urinary Tract: Adrenals appear unremarkable  bilaterally. There is scarring in the left kidney, particularly in the upper pole region. There is no renal mass on either side. There is perinephric stranding on the right. There is no hydronephrosis on either side. In the left kidney, there is a 4 mm calculus with an adjacent 3 mm calculus. In the right kidney midportion, there is a 1.0 x 0.8 cm calculus. There are no ureteral calculi on either side. Urinary bladder contains a Foley catheter. A small amount of air in the urinary bladder is likely due to instrumentation. The wall of the urinary bladder appears mildly thickened. Stomach/Bowel: There is soft tissue stranding in the perirectal region with moderate fluid in this area. Similar changes are noted in the distal sigmoid colon. There are multiple sigmoid diverticula without diverticulitis. There is no bowel wall thickening elsewhere. No bowel obstruction. No evident free air or portal venous air. Vascular/Lymphatic: There is extensive atherosclerotic calcification in the aorta and common iliac arteries. There is no appreciable abdominal aortic aneurysm. Major mesenteric vessels appear patent on this noncontrast enhanced study. There is no adenopathy in the abdomen or pelvis. Reproductive: Uterus is absent. There is no pelvic mass. There is no free fluid in the pelvis, although there is fluid in the perirectal region. Other: Appendix appears normal. No abscess is evident in the abdomen or pelvis. There is a right femoral hernia which contains only fat. Musculoskeletal: There is hemorrhage in the right iliacus muscle with diffuse enlargement of the right iliacus muscle compared to its normal appearing counterpart on the left. Slightly superiorly 8 this muscle, there is stranding in the perinephric region which probably is due to hemorrhage which arose from this area in the pelvis/retroperitoneum. In addition, there is extensive soft tissue stranding throughout the right lateral abdominal and pelvic walls.  There is a focal well-defined hematoma lateral to the right greater trochanter measuring 6.6 x 3.3 x 2.8 cm. Smaller apparent hematomas are noted throughout the area of trauma as well. A lesser degree of thickening is noted in the left lateral pelvic wall. Bones are diffusely osteoporotic. There is evidence of a fracture of the lateral right ischium at the junction with the right acetabulum there is an impacted fracture of the medial right pubic symphysis. There is a fracture of the mid right ischium as well as a fracture of the anterior left ischium near the pubic symphysis. There is a fracture of the superior right sacral ala with subtle impaction in this area. IMPRESSION: Multiple pelvic fractures in near anatomic alignment. There is moderate hemorrhage throughout the right iliacus muscle. Without intravenous contrast, is difficult to exclude active hemorrhage, although the appearance is consistent with hematoma throughout this muscle. There is nearby stranding in the soft tissues along the right retroperitoneum/upper pelvis extending to the lateral right perinephric region. There is no impression on the right kidney. There is extensive abnormality in the soft tissues along the lateral abdominal and pelvic walls bilaterally, more severe on the right than on the left. There are hematomas which are well-defined lateral to the right greater trochanter. There is no air-fluid level suggesting abscess. There are moderate pleural effusions bilaterally with bibasilar lung consolidation. No pneumothorax evident in visualized regions. Calculi in each kidney. Areas of scarring and atrophy in left kidney. No hydronephrosis. Note that there is thickening  of the urinary bladder wall in spite of catheterization. Suspect a degree of cystitis. Fluid in the perirectal region and distal sigmoid colon region with soft tissue stranding in this area. This finding may be due to a degree of proctitis/distal colitis. Trauma involving the  rectum cannot be excluded, although there is no ectopic air in this area or oral contrast extravasation. This finding may warrant sigmoidoscopy and proctoscopy to directly visualize these areas an assess for potential localized trauma. There are sigmoid diverticula without diverticulitis. No bowel wall thickening elsewhere noted. There is aortoiliac atherosclerosis. Aorta appears intact. There is no periaortic or major vessel perivascular fluid. There is a right femoral hernia containing only fat. Electronically Signed   By: Bretta Bang III M.D.   On: 05/04/2016 10:45   Dg Chest 1 View  Result Date: 05/05/2016 CLINICAL DATA:  Status post right thoracentesis. EXAM: CHEST 1 VIEW COMPARISON:  04/30/2016 FINDINGS: No residual right pleural fluid is seen.  No right pneumothorax. Mild hazy opacities noted in the medial left lung base which is likely combination of a small effusion and atelectasis. Remainder of the lungs is clear. Stable changes from cardiac surgery. Left anterior chest wall sequential pacemaker is stable. IMPRESSION: 1. No pneumothorax or evidence of a complication following right thoracentesis. Electronically Signed   By: Amie Portland M.D.   On: 05/05/2016 14:51   Dg Chest 2 View  Result Date: 04/30/2016 CLINICAL DATA:  82 year old female with fall EXAM: CHEST  2 VIEW COMPARISON:  Chest radiograph dated 09/05/2008 FINDINGS: There is emphysematous changes of the lungs. No focal consolidation, pleural effusion, or pneumothorax. The cardiac silhouette is within normal limits. The aorta is mildly tortuous. Median sternotomy wires and left pectoral dual lead pacemaker device noted. There is osteopenia with degenerative changes of the spine. No acute fracture. IMPRESSION: No acute cardiopulmonary process. Emphysema. Electronically Signed   By: Elgie Collard M.D.   On: 04/30/2016 23:43   Dg Pelvis 1-2 Views  Result Date: 04/30/2016 CLINICAL DATA:  Larey Seat at home at 17:00 today.  Generalized weakness for several days. EXAM: PELVIS - 1-2 VIEW COMPARISON:  None. FINDINGS: There is no evidence of pelvic fracture or diastasis. No pelvic bone lesions are seen. IMPRESSION: Negative. Electronically Signed   By: Ellery Plunk M.D.   On: 04/30/2016 23:43   Ct Head Wo Contrast  Result Date: 04/30/2016 CLINICAL DATA:  Larey Seat at 17:00 today. Generalized weakness for several days. EXAM: CT HEAD WITHOUT CONTRAST TECHNIQUE: Contiguous axial images were obtained from the base of the skull through the vertex without intravenous contrast. COMPARISON:  None. FINDINGS: Brain: There is no intracranial hemorrhage, mass or evidence of acute infarction. There is moderate generalized atrophy. There is moderate chronic microvascular ischemic change. There is no significant extra-axial fluid collection. No acute intracranial findings are evident. Vascular: No hyperdense vessel or unexpected calcification. Skull: Normal. Negative for fracture or focal lesion. Sinuses/Orbits: No acute finding. Other: None. IMPRESSION: No acute intracranial findings. There is moderate generalized atrophy and chronic appearing white matter hypodensities which likely represent small vessel ischemic disease. Electronically Signed   By: Ellery Plunk M.D.   On: 04/30/2016 23:29   [place] Chest  Result Date: 05/05/2016 CLINICAL DATA:  Pleural effusion. EXAM: CHEST ULTRASOUND COMPARISON:  Chest x-ray 04/30/2016 . FINDINGS: Small bilateral pleural effusions are noted. Thoracentesis was not performed because patient was unable to sit up or lay on her side. IMPRESSION: Small bilateral pleural effusions. Thoracentesis not performed as above . Electronically Signed  ByMaisie Fus  Register   On: 05/05/2016 10:35   Dg Swallowing Func-speech Pathology  Result Date: 05/01/2016 Objective Swallowing Evaluation: Type of Study: MBS-Modified Barium Swallow Study Patient Details Name: Alexis Cobb MRN: [id number] Date of Birth: 1924/07/14  Today's Date: 05/01/2016 Time: SLP Start Time (ACUTE ONLY): 1440-SLP Stop Time (ACUTE ONLY): 1510 SLP Time Calculation (min) (ACUTE ONLY): 30 min Past Medical History: Past Medical History: Diagnosis Date . Arthritis  . ARTHRITIS, RHEUMATOID, HX OF 01/06/2007 . Cervical stenosis of spine  . HTN (hypertension)  . Hyperlipidemia  . MITRAL REGURGITATION 01/06/2007 . OSTEOARTHRITIS 01/14/2007 . OSTEOPOROSIS 01/14/2007 . Other specified forms of hearing loss 08/01/2009 . PACEMAKER, PERMANENT 01/06/2007 . Raynaud's syndrome 01/06/2007 . Past Surgical History: Past Surgical History: Procedure Laterality Date . ABDOMINAL HYSTERECTOMY   . APPENDECTOMY   . CATARACT EXTRACTION, BILATERAL   . MITRAL VALVE REPAIR   . ovarian tumor, benign   . PACEMAKER INSERTION   HPI: 82 year old female admitted s/p fall at home, presenting with respiratory failure with hypoxia, question early developing aspiration PNA, severe throat, diarrhea. CXR negative. No Data Recorded Assessment / Plan / Recommendation CHL IP CLINICAL IMPRESSIONS 05/01/2016 Therapy Diagnosis Severe pharyngeal phase dysphagia Clinical Impression MBS complete. Patient presents with a severe pharyngeal dysphagia characterized by gross weakness resulting in decreased pharyngeal clearance, severe pharyngeal residuals, and decreased airway protection across consistencies. Patient unable to utilize compensatory strategies in attempts to facilitate decreased penetration/aspiration due to AMS. Unclear origin of dysphagia at this time. Question some degree of baseline dysphagia, now exacerbated by acute condition and/or relation to sore throat. Education complete with patient's neice. Will continue to f/u for differential diagnosis, readiness for pos at bedside.  Impact on safety and function Severe aspiration risk   CHL IP TREATMENT RECOMMENDATION 05/01/2016 Treatment Recommendations Therapy as outlined in treatment plan below   Prognosis 05/01/2016 Prognosis for Safe Diet Advancement  Good Barriers to Reach Goals Severity of deficits Barriers/Prognosis Comment -- CHL IP DIET RECOMMENDATION 05/01/2016 SLP Diet Recommendations NPO;Alternative means - temporary Liquid Administration via -- Medication Administration Via alternative means Compensations -- Postural Changes --   CHL IP OTHER RECOMMENDATIONS 05/01/2016 Recommended Consults -- Oral Care Recommendations Oral care QID Other Recommendations --   CHL IP FOLLOW UP RECOMMENDATIONS 05/01/2016 Follow up Recommendations Other (comment)   CHL IP FREQUENCY AND DURATION 05/01/2016 Speech Therapy Frequency (ACUTE ONLY) min 3x week Treatment Duration 2 weeks      CHL IP ORAL PHASE 05/01/2016 Oral Phase WFL Oral - Pudding Teaspoon -- Oral - Pudding Cup -- Oral - Honey Teaspoon -- Oral - Honey Cup -- Oral - Nectar Teaspoon -- Oral - Nectar Cup -- Oral - Nectar Straw -- Oral - Thin Teaspoon -- Oral - Thin Cup -- Oral - Thin Straw -- Oral - Puree -- Oral - Mech Soft -- Oral - Regular -- Oral - Multi-Consistency -- Oral - Pill -- Oral Phase - Comment --  CHL IP PHARYNGEAL PHASE 05/01/2016 Pharyngeal Phase Impaired Pharyngeal- Pudding Teaspoon -- Pharyngeal -- Pharyngeal- Pudding Cup -- Pharyngeal -- Pharyngeal- Honey Teaspoon -- Pharyngeal -- Pharyngeal- Honey Cup -- Pharyngeal -- Pharyngeal- Nectar Teaspoon Delayed swallow initiation-vallecula;Delayed swallow initiation-pyriform sinuses;Reduced pharyngeal peristalsis;Reduced epiglottic inversion;Reduced anterior laryngeal mobility;Reduced laryngeal elevation;Reduced airway/laryngeal closure;Reduced tongue base retraction;Penetration/Aspiration before swallow;Penetration/Aspiration during swallow;Penetration/Apiration after swallow;Moderate aspiration;Pharyngeal residue - valleculae;Pharyngeal residue - pyriform Pharyngeal Material enters airway, passes BELOW cords without attempt by patient to eject out (silent aspiration) Pharyngeal- Nectar Cup -- Pharyngeal -- Pharyngeal- Nectar Straw --  Pharyngeal --  Pharyngeal- Thin Teaspoon Delayed swallow initiation-vallecula;Delayed swallow initiation-pyriform sinuses;Reduced pharyngeal peristalsis;Reduced epiglottic inversion;Reduced anterior laryngeal mobility;Reduced laryngeal elevation;Reduced airway/laryngeal closure;Reduced tongue base retraction;Penetration/Aspiration before swallow;Penetration/Aspiration during swallow;Penetration/Apiration after swallow;Moderate aspiration;Pharyngeal residue - valleculae;Pharyngeal residue - pyriform Pharyngeal Material enters airway, passes BELOW cords without attempt by patient to eject out (silent aspiration) Pharyngeal- Thin Cup -- Pharyngeal -- Pharyngeal- Thin Straw -- Pharyngeal -- Pharyngeal- Puree Delayed swallow initiation-vallecula;Delayed swallow initiation-pyriform sinuses;Reduced pharyngeal peristalsis;Reduced epiglottic inversion;Reduced anterior laryngeal mobility;Reduced laryngeal elevation;Reduced airway/laryngeal closure;Reduced tongue base retraction;Penetration/Aspiration before swallow;Penetration/Aspiration during swallow;Penetration/Apiration after swallow;Pharyngeal residue - valleculae;Pharyngeal residue - pyriform Pharyngeal Material enters airway, CONTACTS cords and not ejected out Pharyngeal- Mechanical Soft -- Pharyngeal -- Pharyngeal- Regular -- Pharyngeal -- Pharyngeal- Multi-consistency -- Pharyngeal -- Pharyngeal- Pill -- Pharyngeal -- Pharyngeal Comment --  Ferdinand Lango MA, CCC-SLP [phone] McCoy Leah Meryl 05/01/2016, 3:26 PM              US Thoracentesis Asp Pleural Space W/img Guide  Result Date: 05/05/2016 INDICATION: Patient admitted with acute respiratory failure and hypoxia secondary to parainfluenza virus. Small bilateral pleural effusions noted. Request is made for thoracentesis of largest effusion. EXAM: ULTRASOUND GUIDED DIAGNOSTIC AND THERAPEUTIC THORACENTESIS MEDICATIONS: 1% lidocaine COMPLICATIONS: None immediate. PROCEDURE: An ultrasound guided thoracentesis was thoroughly  discussed with the patient and her niece and questions answered. The benefits, risks, alternatives and complications were also discussed. The patient and her niece understands and wishes to proceed with the procedure. Written consent was obtained. Ultrasound was performed to localize and mark an adequate pocket of fluid in the right chest. The area was then prepped and draped in the normal sterile fashion. 1% Lidocaine was used for local anesthesia. Under ultrasound guidance a Safe-T-Centesis catheter was introduced. Thoracentesis was performed. The catheter was removed and a dressing applied. FINDINGS: A total of approximately 0.28 L of serosanguineous fluid was removed. Samples were sent to the laboratory as requested by the clinical team. IMPRESSION: Successful ultrasound guided right thoracentesis yielding 0.28 L of pleural fluid. Read by: Barnetta Chapel, PA-C Electronically Signed   By: Corlis Leak M.D.   On: 05/05/2016 15:11        Discharge Exam: Vitals:   05/06/16 2244 05/07/16 0536  BP: 130/70 128/72  Pulse: 76 69  Resp: 20 20  Temp: 98.6 F (37 C) 97.5 F (36.4 C)   Vitals:   05/06/16 0528 05/06/16 1527 05/06/16 2244 05/07/16 0536  BP: 123/61 (!) 114/57 130/70 128/72  Pulse: 69 72 76 69  Resp: 18 18 20 20   Temp: 97.5 F (36.4 C) 98.4 F (36.9 C) 98.6 F (37 C) 97.5 F (36.4 C)  TempSrc: Oral Oral Oral Axillary  SpO2: 98% 97% 97% 100%  Weight:      Height:        General: Pt is alert, awake, not in acute distress Cardiovascular: RRR, S1/S2 +, no rubs, no gallops Respiratory: CTA bilaterally, no wheezing, no rhonchi Abdominal: Soft, NT, ND, bowel sounds + Extremities: no edema, no cyanosis   The results of significant diagnostics from this hospitalization (including imaging, microbiology, ancillary and laboratory) are listed below for reference.    Significant Diagnostic Studies: Ct Abdomen Pelvis Wo Contrast  Result Date: 05/04/2016 CLINICAL DATA:  Recent fall.   Diarrhea.  Drop in hemoglobin EXAM: CT ABDOMEN AND PELVIS WITHOUT CONTRAST TECHNIQUE: Multidetector CT imaging of the abdomen and pelvis was performed following the standard protocol without IV contrast. A small amount of oral contrast is evident. COMPARISON:  None. FINDINGS: Lower  chest: There are moderate pleural effusions bilaterally with bibasilar consolidation. No pneumothorax evident in the visualized lung regions. Pacemaker leads are attached to the right atrium and right ventricle. There are foci of coronary artery calcification. Visualized pericardium is not appreciably thickened. Hepatobiliary: No focal liver lesions are evident on this noncontrast enhanced study. There is no very hepatic fluid. No liver laceration or rupture is evident. Gallbladder wall is not appreciably thickened. There is no biliary duct dilatation. Pancreas: There is no pancreatic mass or inflammatory focus. No peripancreatic fluid or inflammation is evident. Spleen: The spleen appears intact on this noncontrast enhanced study. No splenic laceration or rupture is evident. No splenic mass or perisplenic fluid. Adrenals/Urinary Tract: Adrenals appear unremarkable bilaterally. There is scarring in the left kidney, particularly in the upper pole region. There is no renal mass on either side. There is perinephric stranding on the right. There is no hydronephrosis on either side. In the left kidney, there is a 4 mm calculus with an adjacent 3 mm calculus. In the right kidney midportion, there is a 1.0 x 0.8 cm calculus. There are no ureteral calculi on either side. Urinary bladder contains a Foley catheter. A small amount of air in the urinary bladder is likely due to instrumentation. The wall of the urinary bladder appears mildly thickened. Stomach/Bowel: There is soft tissue stranding in the perirectal region with moderate fluid in this area. Similar changes are noted in the distal sigmoid colon. There are multiple sigmoid diverticula  without diverticulitis. There is no bowel wall thickening elsewhere. No bowel obstruction. No evident free air or portal venous air. Vascular/Lymphatic: There is extensive atherosclerotic calcification in the aorta and common iliac arteries. There is no appreciable abdominal aortic aneurysm. Major mesenteric vessels appear patent on this noncontrast enhanced study. There is no adenopathy in the abdomen or pelvis. Reproductive: Uterus is absent. There is no pelvic mass. There is no free fluid in the pelvis, although there is fluid in the perirectal region. Other: Appendix appears normal. No abscess is evident in the abdomen or pelvis. There is a right femoral hernia which contains only fat. Musculoskeletal: There is hemorrhage in the right iliacus muscle with diffuse enlargement of the right iliacus muscle compared to its normal appearing counterpart on the left. Slightly superiorly 8 this muscle, there is stranding in the perinephric region which probably is due to hemorrhage which arose from this area in the pelvis/retroperitoneum. In addition, there is extensive soft tissue stranding throughout the right lateral abdominal and pelvic walls. There is a focal well-defined hematoma lateral to the right greater trochanter measuring 6.6 x 3.3 x 2.8 cm. Smaller apparent hematomas are noted throughout the area of trauma as well. A lesser degree of thickening is noted in the left lateral pelvic wall. Bones are diffusely osteoporotic. There is evidence of a fracture of the lateral right ischium at the junction with the right acetabulum there is an impacted fracture of the medial right pubic symphysis. There is a fracture of the mid right ischium as well as a fracture of the anterior left ischium near the pubic symphysis. There is a fracture of the superior right sacral ala with subtle impaction in this area. IMPRESSION: Multiple pelvic fractures in near anatomic alignment. There is moderate hemorrhage throughout the right  iliacus muscle. Without intravenous contrast, is difficult to exclude active hemorrhage, although the appearance is consistent with hematoma throughout this muscle. There is nearby stranding in the soft tissues along the right retroperitoneum/upper pelvis extending to the  lateral right perinephric region. There is no impression on the right kidney. There is extensive abnormality in the soft tissues along the lateral abdominal and pelvic walls bilaterally, more severe on the right than on the left. There are hematomas which are well-defined lateral to the right greater trochanter. There is no air-fluid level suggesting abscess. There are moderate pleural effusions bilaterally with bibasilar lung consolidation. No pneumothorax evident in visualized regions. Calculi in each kidney. Areas of scarring and atrophy in left kidney. No hydronephrosis. Note that there is thickening of the urinary bladder wall in spite of catheterization. Suspect a degree of cystitis. Fluid in the perirectal region and distal sigmoid colon region with soft tissue stranding in this area. This finding may be due to a degree of proctitis/distal colitis. Trauma involving the rectum cannot be excluded, although there is no ectopic air in this area or oral contrast extravasation. This finding may warrant sigmoidoscopy and proctoscopy to directly visualize these areas an assess for potential localized trauma. There are sigmoid diverticula without diverticulitis. No bowel wall thickening elsewhere noted. There is aortoiliac atherosclerosis. Aorta appears intact. There is no periaortic or major vessel perivascular fluid. There is a right femoral hernia containing only fat. Electronically Signed   By: Bretta Bang III M.D.   On: 05/04/2016 10:45   Dg Chest 1 View  Result Date: 05/05/2016 CLINICAL DATA:  Status post right thoracentesis. EXAM: CHEST 1 VIEW COMPARISON:  04/30/2016 FINDINGS: No residual right pleural fluid is seen.  No right  pneumothorax. Mild hazy opacities noted in the medial left lung base which is likely combination of a small effusion and atelectasis. Remainder of the lungs is clear. Stable changes from cardiac surgery. Left anterior chest wall sequential pacemaker is stable. IMPRESSION: 1. No pneumothorax or evidence of a complication following right thoracentesis. Electronically Signed   By: Amie Portland M.D.   On: 05/05/2016 14:51   Dg Chest 2 View  Result Date: 04/30/2016 CLINICAL DATA:  82 year old female with fall EXAM: CHEST  2 VIEW COMPARISON:  Chest radiograph dated 09/05/2008 FINDINGS: There is emphysematous changes of the lungs. No focal consolidation, pleural effusion, or pneumothorax. The cardiac silhouette is within normal limits. The aorta is mildly tortuous. Median sternotomy wires and left pectoral dual lead pacemaker device noted. There is osteopenia with degenerative changes of the spine. No acute fracture. IMPRESSION: No acute cardiopulmonary process. Emphysema. Electronically Signed   By: Elgie Collard M.D.   On: 04/30/2016 23:43   Dg Pelvis 1-2 Views  Result Date: 04/30/2016 CLINICAL DATA:  Larey Seat at home at 17:00 today. Generalized weakness for several days. EXAM: PELVIS - 1-2 VIEW COMPARISON:  None. FINDINGS: There is no evidence of pelvic fracture or diastasis. No pelvic bone lesions are seen. IMPRESSION: Negative. Electronically Signed   By: Ellery Plunk M.D.   On: 04/30/2016 23:43   Ct Head Wo Contrast  Result Date: 04/30/2016 CLINICAL DATA:  Larey Seat at 17:00 today. Generalized weakness for several days. EXAM: CT HEAD WITHOUT CONTRAST TECHNIQUE: Contiguous axial images were obtained from the base of the skull through the vertex without intravenous contrast. COMPARISON:  None. FINDINGS: Brain: There is no intracranial hemorrhage, mass or evidence of acute infarction. There is moderate generalized atrophy. There is moderate chronic microvascular ischemic change. There is no significant  extra-axial fluid collection. No acute intracranial findings are evident. Vascular: No hyperdense vessel or unexpected calcification. Skull: Normal. Negative for fracture or focal lesion. Sinuses/Orbits: No acute finding. Other: None. IMPRESSION: No acute intracranial findings.  There is moderate generalized atrophy and chronic appearing white matter hypodensities which likely represent small vessel ischemic disease. Electronically Signed   By: Ellery Plunk M.D.   On: 04/30/2016 23:29   [place] Chest  Result Date: 05/05/2016 CLINICAL DATA:  Pleural effusion. EXAM: CHEST ULTRASOUND COMPARISON:  Chest x-ray 04/30/2016 . FINDINGS: Small bilateral pleural effusions are noted. Thoracentesis was not performed because patient was unable to sit up or lay on her side. IMPRESSION: Small bilateral pleural effusions. Thoracentesis not performed as above . Electronically Signed   By: Maisie Fus  Register   On: 05/05/2016 10:35   Dg Swallowing Func-speech Pathology  Result Date: 05/01/2016 Objective Swallowing Evaluation: Type of Study: MBS-Modified Barium Swallow Study Patient Details Name: Alexis Cobb MRN: [id number] Date of Birth: 1924-10-07 Today's Date: 05/01/2016 Time: SLP Start Time (ACUTE ONLY): 1440-SLP Stop Time (ACUTE ONLY): 1510 SLP Time Calculation (min) (ACUTE ONLY): 30 min Past Medical History: Past Medical History: Diagnosis Date . Arthritis  . ARTHRITIS, RHEUMATOID, HX OF 01/06/2007 . Cervical stenosis of spine  . HTN (hypertension)  . Hyperlipidemia  . MITRAL REGURGITATION 01/06/2007 . OSTEOARTHRITIS 01/14/2007 . OSTEOPOROSIS 01/14/2007 . Other specified forms of hearing loss 08/01/2009 . PACEMAKER, PERMANENT 01/06/2007 . Raynaud's syndrome 01/06/2007 . Past Surgical History: Past Surgical History: Procedure Laterality Date . ABDOMINAL HYSTERECTOMY   . APPENDECTOMY   . CATARACT EXTRACTION, BILATERAL   . MITRAL VALVE REPAIR   . ovarian tumor, benign   . PACEMAKER INSERTION   HPI: 82 year old female admitted s/p  fall at home, presenting with respiratory failure with hypoxia, question early developing aspiration PNA, severe throat, diarrhea. CXR negative. No Data Recorded Assessment / Plan / Recommendation CHL IP CLINICAL IMPRESSIONS 05/01/2016 Therapy Diagnosis Severe pharyngeal phase dysphagia Clinical Impression MBS complete. Patient presents with a severe pharyngeal dysphagia characterized by gross weakness resulting in decreased pharyngeal clearance, severe pharyngeal residuals, and decreased airway protection across consistencies. Patient unable to utilize compensatory strategies in attempts to facilitate decreased penetration/aspiration due to AMS. Unclear origin of dysphagia at this time. Question some degree of baseline dysphagia, now exacerbated by acute condition and/or relation to sore throat. Education complete with patient's neice. Will continue to f/u for differential diagnosis, readiness for pos at bedside.  Impact on safety and function Severe aspiration risk   CHL IP TREATMENT RECOMMENDATION 05/01/2016 Treatment Recommendations Therapy as outlined in treatment plan below   Prognosis 05/01/2016 Prognosis for Safe Diet Advancement Good Barriers to Reach Goals Severity of deficits Barriers/Prognosis Comment -- CHL IP DIET RECOMMENDATION 05/01/2016 SLP Diet Recommendations NPO;Alternative means - temporary Liquid Administration via -- Medication Administration Via alternative means Compensations -- Postural Changes --   CHL IP OTHER RECOMMENDATIONS 05/01/2016 Recommended Consults -- Oral Care Recommendations Oral care QID Other Recommendations --   CHL IP FOLLOW UP RECOMMENDATIONS 05/01/2016 Follow up Recommendations Other (comment)   CHL IP FREQUENCY AND DURATION 05/01/2016 Speech Therapy Frequency (ACUTE ONLY) min 3x week Treatment Duration 2 weeks      CHL IP ORAL PHASE 05/01/2016 Oral Phase WFL Oral - Pudding Teaspoon -- Oral - Pudding Cup -- Oral - Honey Teaspoon -- Oral - Honey Cup -- Oral - Nectar  Teaspoon -- Oral - Nectar Cup -- Oral - Nectar Straw -- Oral - Thin Teaspoon -- Oral - Thin Cup -- Oral - Thin Straw -- Oral - Puree -- Oral - Mech Soft -- Oral - Regular -- Oral - Multi-Consistency -- Oral - Pill -- Oral Phase - Comment --  CHL IP  PHARYNGEAL PHASE 05/01/2016 Pharyngeal Phase Impaired Pharyngeal- Pudding Teaspoon -- Pharyngeal -- Pharyngeal- Pudding Cup -- Pharyngeal -- Pharyngeal- Honey Teaspoon -- Pharyngeal -- Pharyngeal- Honey Cup -- Pharyngeal -- Pharyngeal- Nectar Teaspoon Delayed swallow initiation-vallecula;Delayed swallow initiation-pyriform sinuses;Reduced pharyngeal peristalsis;Reduced epiglottic inversion;Reduced anterior laryngeal mobility;Reduced laryngeal elevation;Reduced airway/laryngeal closure;Reduced tongue base retraction;Penetration/Aspiration before swallow;Penetration/Aspiration during swallow;Penetration/Apiration after swallow;Moderate aspiration;Pharyngeal residue - valleculae;Pharyngeal residue - pyriform Pharyngeal Material enters airway, passes BELOW cords without attempt by patient to eject out (silent aspiration) Pharyngeal- Nectar Cup -- Pharyngeal -- Pharyngeal- Nectar Straw -- Pharyngeal -- Pharyngeal- Thin Teaspoon Delayed swallow initiation-vallecula;Delayed swallow initiation-pyriform sinuses;Reduced pharyngeal peristalsis;Reduced epiglottic inversion;Reduced anterior laryngeal mobility;Reduced laryngeal elevation;Reduced airway/laryngeal closure;Reduced tongue base retraction;Penetration/Aspiration before swallow;Penetration/Aspiration during swallow;Penetration/Apiration after swallow;Moderate aspiration;Pharyngeal residue - valleculae;Pharyngeal residue - pyriform Pharyngeal Material enters airway, passes BELOW cords without attempt by patient to eject out (silent aspiration) Pharyngeal- Thin Cup -- Pharyngeal -- Pharyngeal- Thin Straw -- Pharyngeal -- Pharyngeal- Puree Delayed swallow initiation-vallecula;Delayed swallow initiation-pyriform sinuses;Reduced  pharyngeal peristalsis;Reduced epiglottic inversion;Reduced anterior laryngeal mobility;Reduced laryngeal elevation;Reduced airway/laryngeal closure;Reduced tongue base retraction;Penetration/Aspiration before swallow;Penetration/Aspiration during swallow;Penetration/Apiration after swallow;Pharyngeal residue - valleculae;Pharyngeal residue - pyriform Pharyngeal Material enters airway, CONTACTS cords and not ejected out Pharyngeal- Mechanical Soft -- Pharyngeal -- Pharyngeal- Regular -- Pharyngeal -- Pharyngeal- Multi-consistency -- Pharyngeal -- Pharyngeal- Pill -- Pharyngeal -- Pharyngeal Comment --  Ferdinand Lango MA, CCC-SLP [phone] McCoy Leah Meryl 05/01/2016, 3:26 PM              US Thoracentesis Asp Pleural Space W/img Guide  Result Date: 05/05/2016 INDICATION: Patient admitted with acute respiratory failure and hypoxia secondary to parainfluenza virus. Small bilateral pleural effusions noted. Request is made for thoracentesis of largest effusion. EXAM: ULTRASOUND GUIDED DIAGNOSTIC AND THERAPEUTIC THORACENTESIS MEDICATIONS: 1% lidocaine COMPLICATIONS: None immediate. PROCEDURE: An ultrasound guided thoracentesis was thoroughly discussed with the patient and her niece and questions answered. The benefits, risks, alternatives and complications were also discussed. The patient and her niece understands and wishes to proceed with the procedure. Written consent was obtained. Ultrasound was performed to localize and mark an adequate pocket of fluid in the right chest. The area was then prepped and draped in the normal sterile fashion. 1% Lidocaine was used for local anesthesia. Under ultrasound guidance a Safe-T-Centesis catheter was introduced. Thoracentesis was performed. The catheter was removed and a dressing applied. FINDINGS: A total of approximately 0.28 L of serosanguineous fluid was removed. Samples were sent to the laboratory as requested by the clinical team. IMPRESSION: Successful ultrasound  guided right thoracentesis yielding 0.28 L of pleural fluid. Read by: Barnetta Chapel, PA-C Electronically Signed   By: Corlis Leak M.D.   On: 05/05/2016 15:11     Microbiology: Recent Results (from the past 240 hour(s))  Rapid strep screen (not at [facility])     Status: None   Collection Time: 05/01/16  1:52 AM  Result Value Ref Range Status   Streptococcus, Group A Screen (Direct) NEGATIVE NEGATIVE Final    Comment: (NOTE) A Rapid Antigen test may result negative if the antigen level in the sample is below the detection level of this test. The FDA has not cleared this test as a stand-alone test therefore the rapid antigen negative result has reflexed to a Group A Strep culture.   Culture, group A strep     Status: None   Collection Time: 05/01/16  1:52 AM  Result Value Ref Range Status   Specimen Description THROAT  Final   Special Requests NONE Reflexed from [id number]  Final  Culture   Final    NO GROUP A STREP (S.PYOGENES) ISOLATED Performed at [facility]    Report Status 05/03/2016 FINAL  Final  Culture, blood (routine x 2) Call MD if unable to obtain prior to antibiotics being given     Status: None   Collection Time: 05/01/16  2:19 AM  Result Value Ref Range Status   Specimen Description BLOOD RIGHT ARM  Final   Special Requests BOTTLES DRAWN AEROBIC ONLY 7 CC  Final   Culture   Final    NO GROWTH 5 DAYS Performed at [facility]    Report Status 05/06/2016 FINAL  Final  Culture, blood (routine x 2) Call MD if unable to obtain prior to antibiotics being given     Status: None   Collection Time: 05/01/16  2:19 AM  Result Value Ref Range Status   Specimen Description BLOOD RIGHT WRIST  Final   Special Requests BOTTLES DRAWN AEROBIC AND ANAEROBIC 5 CC  Final   Culture   Final    NO GROWTH 5 DAYS Performed at [facility]    Report Status 05/06/2016 FINAL  Final  Respiratory Panel by PCR     Status: Abnormal   Collection Time: 05/01/16  4:04 PM  Result  Value Ref Range Status   Adenovirus NOT DETECTED NOT DETECTED Final   Coronavirus 229E NOT DETECTED NOT DETECTED Final   Coronavirus HKU1 NOT DETECTED NOT DETECTED Final   Coronavirus NL63 NOT DETECTED NOT DETECTED Final   Coronavirus OC43 NOT DETECTED NOT DETECTED Final   Metapneumovirus NOT DETECTED NOT DETECTED Final   Rhinovirus / Enterovirus NOT DETECTED NOT DETECTED Final   Influenza A NOT DETECTED NOT DETECTED Final   Influenza B NOT DETECTED NOT DETECTED Final   Parainfluenza Virus 1 DETECTED (A) NOT DETECTED Final   Parainfluenza Virus 2 NOT DETECTED NOT DETECTED Final   Parainfluenza Virus 3 NOT DETECTED NOT DETECTED Final   Parainfluenza Virus 4 NOT DETECTED NOT DETECTED Final   Respiratory Syncytial Virus NOT DETECTED NOT DETECTED Final   Bordetella pertussis NOT DETECTED NOT DETECTED Final   Chlamydophila pneumoniae NOT DETECTED NOT DETECTED Final   Mycoplasma pneumoniae NOT DETECTED NOT DETECTED Final    Comment: Performed at [facility]  Gram stain     Status: None   Collection Time: 05/05/16  2:50 PM  Result Value Ref Range Status   Specimen Description PLEURAL RIGHT  Final   Special Requests FLUID  Final   Gram Stain   Final    WBC PRESENT,BOTH PMN AND MONONUCLEAR NO ORGANISMS SEEN CYTOSPIN Performed at [facility]    Report Status 05/05/2016 FINAL  Final  Culture, body fluid-bottle     Status: None (Preliminary result)   Collection Time: 05/05/16  2:50 PM  Result Value Ref Range Status   Specimen Description PLEURAL RIGHT  Final   Special Requests FLUID  Final   Culture   Final    NO GROWTH < 24 HOURS Performed at [facility]    Report Status PENDING  Incomplete     Labs: Basic Metabolic Panel:  Recent Labs Lab 05/02/16 0551 05/03/16 0600 05/04/16 0248 05/05/16 0542 05/06/16 0510 05/07/16 0521  NA 136 138 141 143 142 141  K 3.1* 3.7 3.7 3.8 3.4* 3.3*  CL 103 106 110 109 104 102  CO2 26 23 21* 27 31 32  GLUCOSE 108* 81  77 114* 104* 97  BUN 13 17 21* 21*  19 18  CREATININE 0.67 0.77 0.72 0.64 0.65 0.69  CALCIUM 8.4* 8.2* 8.6* 8.5* 8.5* 8.7*  MG 1.6* 1.9 1.9 1.8  --   --   PHOS 2.7 2.5 2.8 2.1*  --   --    Liver Function Tests:  Recent Labs Lab 04/30/16 2027 05/02/16 0551 05/03/16 0600 05/04/16 0248 05/05/16 0542  AST 39 49* 40 40 37  ALT 23 20 16 19 18   ALKPHOS 54 41 31* 35* 39  BILITOT 0.8 0.8 0.8 1.4* 1.8*  PROT 6.3* 5.3* 4.5* 4.6* 4.7*  ALBUMIN 3.6 3.1* 2.4* 2.5* 2.5*   No results for input(s): LIPASE, AMYLASE in the last 168 hours. No results for input(s): AMMONIA in the last 168 hours. CBC:  Recent Labs Lab 04/30/16 2027  05/02/16 0551 05/03/16 0600  05/03/16 2017 05/04/16 0248 05/05/16 0542 05/06/16 0510 05/07/16 0521  WBC 6.4  < > 11.1* 8.6  --   --  8.5 8.0 9.6 10.2  NEUTROABS 4.8  --  8.8* 6.5  --   --  6.4 5.6  --   --   HGB 13.5  < > 11.8* 8.8*  < > 8.8* 8.0*  7.8* 10.0* 11.0* 10.7*  HCT 41.1  < > 34.5* 25.9*  < > 26.6* 23.5*  22.9* 29.8* 33.0* 32.1*  MCV 97.9  < > 95.3 95.6  --   --  96.3 87.9 92.4 93.0  PLT 147*  < > 127* 103*  --   --  123* 140* 197 246  < > = values in this interval not displayed. Cardiac Enzymes: No results for input(s): CKTOTAL, CKMB, CKMBINDEX, TROPONINI in the last 168 hours. BNP: Invalid input(s): POCBNP CBG: No results for input(s): GLUCAP in the last 168 hours.  Time coordinating discharge:  Greater than 30 minutes  Signed:  Maimouna Rondeau, DO [facility] Pager: [phone] 05/07/2016, 1:46 PM

## 2016-05-06 NOTE — Progress Notes (Signed)
Nutrition Follow-up  DOCUMENTATION CODES:   Severe malnutrition in context of chronic illness, Underweight  INTERVENTION:  Continue Mightyshake II BID with lunch and dinner trays, each supplement provides 500 kcal and 23 grams of protein. Can decrease to one per day as intake improves.  Encouraged intake of adequate calories and protein with meals. Encouraged patient to order 3 meals per day.  NUTRITION DIAGNOSIS:   Malnutrition (Severe) related to chronic illness as evidenced by severe depletion of muscle mass, severe depletion of body fat.  Ongoing.  GOAL:   Patient will meet greater than or equal to 90% of their needs  Not met.   MONITOR:   PO intake, Supplement acceptance, Labs, Weight trends, I & O's  REASON FOR ASSESSMENT:   Malnutrition Screening Tool    ASSESSMENT:   82 y.o.femalewith medical history significant of mitral valve repair, pacemaker placement, HTN, and HLD presenting after she slipped and fell in the bedroom. Patient found to have generalized weakness and deconditioning from Parainfluenza Virus and Diarrhea, Acute Respiratory Failure with Hypoxia suspect Aspiration PNA.  Spoke with patient at bedside. She reports her appetite is "okay." Diarrhea has improved and patient denies N/V or abdominal pain. She reports finishing about 50% of meals, but she has not been receiving Mightyshake II on trays. She reports she would drink them if brought on tray.   Meal Completion: 50% per chart and patient report. In the past 24 hours patient has had approximately 621 kcal (50% minimum estimated kcal needs) and 26 grams of protein (46% minimum estimated protein needs).   Medications reviewed and include: Thick It powder PRN.  Labs reviewed: Potassium 3.4, Glucose 104, Vitamin B12 2697.  Discussed with RN.   Diet Order:  DIET - DYS 1 Room service appropriate? Yes; Fluid consistency: Nectar Thick  Skin:  Reviewed, no issues  Last BM:  05/03/2016  Height:    Ht Readings from Last 1 Encounters:  05/01/16 _0  (1.6 m)    Weight:   Wt Readings from Last 1 Encounters:  05/01/16 103 lb (46.7 kg)    Ideal Body Weight:  52.3 kg  BMI:  Body mass index is 18.25 kg/m.  Estimated Nutritional Needs:   Kcal:  1240-1340 (HBE x 1.3-1.4)  Protein:  56-65 grams (1.2-1.4 grams/kg)  Fluid:  >/= 1.2 L/day (25 ml/kg)  EDUCATION NEEDS:   No education needs identified at this time  Willey Blade, MS, RD, LDN Pager: [phone] After Hours Pager: [phone]

## 2016-05-06 NOTE — Progress Notes (Signed)
PROGRESS NOTE  Alexis Cobb:[id number] DOB: 05-17-1924 DOA: 04/30/2016 PCP: Oliver Barre, MD Brief History:  82 y.o.femalewith medical history significant of mitral valve repair, pacemaker placement, HTN, and HLD presenting after she slipped and fell in the bedroom. She niece (an Charity fundraiser) had called her about 115 today. She didn't want to go see doctor despite sounding horrible. The patient had a scratchy throat Wednesday, but the niece didn't talk to her yesterday. The patient usually gets hair done Friday, but didn't feel well enough today to get hair done. The niece called her sister (a speech therapist), who went to see her; the patientwouldn't answer the door. The patient reports that she slipped and fell; shepressed her life alert - the companynotified the RN niece'shusband and he called her. She arrived at the same time as the ambulance. The patientwas between 2 twin beds, had on socks but no slippers on hardwood floors. Unable to get up. The niece last spoke with her at 69, arrived at 1. Family reports that the patient is very pale. She has had diarrhea for the last 2 days, had stool incontinence when they found herand she has had 3 watery stools while in the ER. The patient states that she has never had such a severe sore throat before. No headache. No cough, no fever. Has not been feeling SOB. Admitted for Respiratory Failure, Generalized Weakness and Debility, and Diarrhea. Found to have Parainfluenza 1 Virus.   Assessment/Plan: Acute respiratory failure with hypoxia -Secondary to pleural effusions and aspiration pneumonitis in the setting of parainfluenza virus infection -Completed 5 days of levofloxacin 05/05/2016 -Patient was stable on 2 L nasal cannula -05/04/2016 CT abdomen and pelvis--bilateral moderate pleural effusions with bibasilar consolidations -Pulmonary hygiene -Flutter valve -05/05/16 thoracocentesis--0.28L  removed-->transudate-->lasix IV given  Pleural effusions--transudate -re-attempt thoracocentesis -Echocardiogram--results pending -continue lasix  Diarrhea with rectal and sigmoid colon thickening -consult GI--patient did not want further work up-->follow up out pt -diarrhea seems to be improving--may have been due to viral infection  Acute blood loss anemia -Secondary to iliacus/pelvic hemorrhage from mechanical fall -Continue aspirin for now -Monitor hemoglobin--stable -05/04/2016 CT abdomen pelvis--right iliac as hemorrhage with focal hematoma lateral to the right trochanter with scattered small hematomas throughout the pelvic area -Transfused 2 units PRBC during admission--Hgb stable thereafter  Multiple pelvic fractures -Dr. Marland Mcalpine discussed with ortho, Dr. Lauree Chandler, nonoperative management-->WBAT -secondary to mechanical fall  Generalized weakness -Urinalysis is negative for pyuria -CT brain negative for acute intracranial findings -Check TSH--1.094 -Serum B12--2697 -PT eval--SNF  Hypertension -Continue metoprolol succinate and losartan -Controlled  Hyperlipidemia -Continue pravastatin    Disposition Plan:   SNF 12/29 if stable Family Communication:   No family at bedside   Consultants:  [facility]  Code Status:  FULL  DVT Prophylaxis:  SCDs   Procedures: As Listed in Progress Note Above  Antibiotics: None  Subjective: Patient denies fevers, chills, headache, chest pain, dyspnea, nausea, vomiting, diarrhea, abdominal pain, dysuria   Objective: Vitals:   05/05/16 1538 05/05/16 2142 05/06/16 0528 05/06/16 1527  BP: 140/65 138/75 123/61 (!) 114/57  Pulse: 73 74 69 72  Resp: 18 19 18 18   Temp: 97.8 F (36.6 C) 97.8 F (36.6 C) 97.5 F (36.4 C) 98.4 F (36.9 C)  TempSrc: Axillary Oral Oral Oral  SpO2: 98% 97% 98% 97%  Weight:      Height:        Intake/Output Summary (Last 24 hours) at 05/06/16 1615  Last data  filed at 05/06/16 0900  Gross per 24 hour  Intake              120 ml  Output             1800 ml  Net            -1680 ml   Weight change:  Exam:   General:  Pt is alert, follows commands appropriately, not in acute distress  HEENT: No icterus, No thrush, No neck mass, [place]/AT  Cardiovascular: RRR, S1/S2, no rubs, no gallops  Respiratory: Bibasilar crackles. No wheezing. Good air movement.  Abdomen: Soft/+BS, non tender, non distended, no guarding  Extremities: trace edema, No lymphangitis, No petechiae, No rashes, no synovitis   Data Reviewed: I have personally reviewed following labs and imaging studies Basic Metabolic Panel:  Recent Labs Lab 05/02/16 0551 05/03/16 0600 05/04/16 0248 05/05/16 0542 05/06/16 0510  NA 136 138 141 143 142  K 3.1* 3.7 3.7 3.8 3.4*  CL 103 106 110 109 104  CO2 26 23 21* 27 31  GLUCOSE 108* 81 77 114* 104*  BUN 13 17 21* 21* 19  CREATININE 0.67 0.77 0.72 0.64 0.65  CALCIUM 8.4* 8.2* 8.6* 8.5* 8.5*  MG 1.6* 1.9 1.9 1.8  --   PHOS 2.7 2.5 2.8 2.1*  --    Liver Function Tests:  Recent Labs Lab 04/30/16 2027 05/02/16 0551 05/03/16 0600 05/04/16 0248 05/05/16 0542  AST 39 49* 40 40 37  ALT 23 20 16 19 18   ALKPHOS 54 41 31* 35* 39  BILITOT 0.8 0.8 0.8 1.4* 1.8*  PROT 6.3* 5.3* 4.5* 4.6* 4.7*  ALBUMIN 3.6 3.1* 2.4* 2.5* 2.5*   No results for input(s): LIPASE, AMYLASE in the last 168 hours. No results for input(s): AMMONIA in the last 168 hours. Coagulation Profile: No results for input(s): INR, PROTIME in the last 168 hours. CBC:  Recent Labs Lab 04/30/16 2027  05/02/16 0551 05/03/16 0600  05/03/16 1456 05/03/16 2017 05/04/16 0248 05/05/16 0542 05/06/16 0510  WBC 6.4  < > 11.1* 8.6  --   --   --  8.5 8.0 9.6  NEUTROABS 4.8  --  8.8* 6.5  --   --   --  6.4 5.6  --   HGB 13.5  < > 11.8* 8.8*  < > 8.6* 8.8* 8.0*  7.8* 10.0* 11.0*  HCT 41.1  < > 34.5* 25.9*  < > 24.8* 26.6* 23.5*  22.9* 29.8* 33.0*  MCV 97.9  < > 95.3  95.6  --   --   --  96.3 87.9 92.4  PLT 147*  < > 127* 103*  --   --   --  123* 140* 197  < > = values in this interval not displayed. Cardiac Enzymes: No results for input(s): CKTOTAL, CKMB, CKMBINDEX, TROPONINI in the last 168 hours. BNP: Invalid input(s): POCBNP CBG: No results for input(s): GLUCAP in the last 168 hours. HbA1C: No results for input(s): HGBA1C in the last 72 hours. Urine analysis:    Component Value Date/Time   COLORURINE YELLOW 04/30/2016 2338   APPEARANCEUR HAZY (A) 04/30/2016 2338   LABSPEC 1.013 04/30/2016 2338   PHURINE 8.0 04/30/2016 2338   GLUCOSEU NEGATIVE 04/30/2016 2338   GLUCOSEU NEGATIVE 12/12/2013 1230   HGBUR NEGATIVE 04/30/2016 2338   BILIRUBINUR NEGATIVE 04/30/2016 2338   KETONESUR 5 (A) 04/30/2016 2338   PROTEINUR 30 (A) 04/30/2016 2338   UROBILINOGEN 0.2 12/12/2013 1230  NITRITE NEGATIVE 04/30/2016 2338   LEUKOCYTESUR NEGATIVE 04/30/2016 2338   Sepsis Labs: @LABRCNTIP (procalcitonin:4,lacticidven:4) ) Recent Results (from the past 240 hour(s))  Rapid strep screen (not at [facility])     Status: None   Collection Time: 05/01/16  1:52 AM  Result Value Ref Range Status   Streptococcus, Group A Screen (Direct) NEGATIVE NEGATIVE Final    Comment: (NOTE) A Rapid Antigen test may result negative if the antigen level in the sample is below the detection level of this test. The FDA has not cleared this test as a stand-alone test therefore the rapid antigen negative result has reflexed to a Group A Strep culture.   Culture, group A strep     Status: None   Collection Time: 05/01/16  1:52 AM  Result Value Ref Range Status   Specimen Description THROAT  Final   Special Requests NONE Reflexed from 05/03/16  Final   Culture   Final    NO GROUP A STREP (S.PYOGENES) ISOLATED Performed at [facility]    Report Status 05/03/2016 FINAL  Final  Culture, blood (routine x 2) Call MD if unable to obtain prior to antibiotics being given     Status:  None   Collection Time: 05/01/16  2:19 AM  Result Value Ref Range Status   Specimen Description BLOOD RIGHT ARM  Final   Special Requests BOTTLES DRAWN AEROBIC ONLY 7 CC  Final   Culture   Final    NO GROWTH 5 DAYS Performed at [facility]    Report Status 05/06/2016 FINAL  Final  Culture, blood (routine x 2) Call MD if unable to obtain prior to antibiotics being given     Status: None   Collection Time: 05/01/16  2:19 AM  Result Value Ref Range Status   Specimen Description BLOOD RIGHT WRIST  Final   Special Requests BOTTLES DRAWN AEROBIC AND ANAEROBIC 5 CC  Final   Culture   Final    NO GROWTH 5 DAYS Performed at [facility]    Report Status 05/06/2016 FINAL  Final  Respiratory Panel by PCR     Status: Abnormal   Collection Time: 05/01/16  4:04 PM  Result Value Ref Range Status   Adenovirus NOT DETECTED NOT DETECTED Final   Coronavirus 229E NOT DETECTED NOT DETECTED Final   Coronavirus HKU1 NOT DETECTED NOT DETECTED Final   Coronavirus NL63 NOT DETECTED NOT DETECTED Final   Coronavirus OC43 NOT DETECTED NOT DETECTED Final   Metapneumovirus NOT DETECTED NOT DETECTED Final   Rhinovirus / Enterovirus NOT DETECTED NOT DETECTED Final   Influenza A NOT DETECTED NOT DETECTED Final   Influenza B NOT DETECTED NOT DETECTED Final   Parainfluenza Virus 1 DETECTED (A) NOT DETECTED Final   Parainfluenza Virus 2 NOT DETECTED NOT DETECTED Final   Parainfluenza Virus 3 NOT DETECTED NOT DETECTED Final   Parainfluenza Virus 4 NOT DETECTED NOT DETECTED Final   Respiratory Syncytial Virus NOT DETECTED NOT DETECTED Final   Bordetella pertussis NOT DETECTED NOT DETECTED Final   Chlamydophila pneumoniae NOT DETECTED NOT DETECTED Final   Mycoplasma pneumoniae NOT DETECTED NOT DETECTED Final    Comment: Performed at [facility]  Gram stain     Status: None   Collection Time: 05/05/16  2:50 PM  Result Value Ref Range Status   Specimen Description PLEURAL RIGHT  Final    Special Requests FLUID  Final   Gram Stain   Final    WBC PRESENT,BOTH PMN AND  MONONUCLEAR NO ORGANISMS SEEN CYTOSPIN Performed at [facility]    Report Status 05/05/2016 FINAL  Final  Culture, body fluid-bottle     Status: None (Preliminary result)   Collection Time: 05/05/16  2:50 PM  Result Value Ref Range Status   Specimen Description PLEURAL RIGHT  Final   Special Requests FLUID  Final   Culture   Final    NO GROWTH < 24 HOURS Performed at [facility]    Report Status PENDING  Incomplete     Scheduled Meds: . sodium chloride   Intravenous Once  . aspirin EC  81 mg Oral QHS  . chlorhexidine  15 mL Mouth/Throat BID  . furosemide  20 mg Oral Daily  . losartan  100 mg Oral Daily  . metoprolol succinate  75 mg Oral Daily  . potassium chloride  20 mEq Oral Daily  . pravastatin  40 mg Oral q1800   Continuous Infusions:  Procedures/Studies: Ct Abdomen Pelvis Wo Contrast  Result Date: 05/04/2016 CLINICAL DATA:  Recent fall.  Diarrhea.  Drop in hemoglobin EXAM: CT ABDOMEN AND PELVIS WITHOUT CONTRAST TECHNIQUE: Multidetector CT imaging of the abdomen and pelvis was performed following the standard protocol without IV contrast. A small amount of oral contrast is evident. COMPARISON:  None. FINDINGS: Lower chest: There are moderate pleural effusions bilaterally with bibasilar consolidation. No pneumothorax evident in the visualized lung regions. Pacemaker leads are attached to the right atrium and right ventricle. There are foci of coronary artery calcification. Visualized pericardium is not appreciably thickened. Hepatobiliary: No focal liver lesions are evident on this noncontrast enhanced study. There is no very hepatic fluid. No liver laceration or rupture is evident. Gallbladder wall is not appreciably thickened. There is no biliary duct dilatation. Pancreas: There is no pancreatic mass or inflammatory focus. No peripancreatic fluid or inflammation is evident. Spleen:  The spleen appears intact on this noncontrast enhanced study. No splenic laceration or rupture is evident. No splenic mass or perisplenic fluid. Adrenals/Urinary Tract: Adrenals appear unremarkable bilaterally. There is scarring in the left kidney, particularly in the upper pole region. There is no renal mass on either side. There is perinephric stranding on the right. There is no hydronephrosis on either side. In the left kidney, there is a 4 mm calculus with an adjacent 3 mm calculus. In the right kidney midportion, there is a 1.0 x 0.8 cm calculus. There are no ureteral calculi on either side. Urinary bladder contains a Foley catheter. A small amount of air in the urinary bladder is likely due to instrumentation. The wall of the urinary bladder appears mildly thickened. Stomach/Bowel: There is soft tissue stranding in the perirectal region with moderate fluid in this area. Similar changes are noted in the distal sigmoid colon. There are multiple sigmoid diverticula without diverticulitis. There is no bowel wall thickening elsewhere. No bowel obstruction. No evident free air or portal venous air. Vascular/Lymphatic: There is extensive atherosclerotic calcification in the aorta and common iliac arteries. There is no appreciable abdominal aortic aneurysm. Major mesenteric vessels appear patent on this noncontrast enhanced study. There is no adenopathy in the abdomen or pelvis. Reproductive: Uterus is absent. There is no pelvic mass. There is no free fluid in the pelvis, although there is fluid in the perirectal region. Other: Appendix appears normal. No abscess is evident in the abdomen or pelvis. There is a right femoral hernia which contains only fat. Musculoskeletal: There is hemorrhage in the right iliacus muscle with diffuse enlargement of the  right iliacus muscle compared to its normal appearing counterpart on the left. Slightly superiorly 8 this muscle, there is stranding in the perinephric region which  probably is due to hemorrhage which arose from this area in the pelvis/retroperitoneum. In addition, there is extensive soft tissue stranding throughout the right lateral abdominal and pelvic walls. There is a focal well-defined hematoma lateral to the right greater trochanter measuring 6.6 x 3.3 x 2.8 cm. Smaller apparent hematomas are noted throughout the area of trauma as well. A lesser degree of thickening is noted in the left lateral pelvic wall. Bones are diffusely osteoporotic. There is evidence of a fracture of the lateral right ischium at the junction with the right acetabulum there is an impacted fracture of the medial right pubic symphysis. There is a fracture of the mid right ischium as well as a fracture of the anterior left ischium near the pubic symphysis. There is a fracture of the superior right sacral ala with subtle impaction in this area. IMPRESSION: Multiple pelvic fractures in near anatomic alignment. There is moderate hemorrhage throughout the right iliacus muscle. Without intravenous contrast, is difficult to exclude active hemorrhage, although the appearance is consistent with hematoma throughout this muscle. There is nearby stranding in the soft tissues along the right retroperitoneum/upper pelvis extending to the lateral right perinephric region. There is no impression on the right kidney. There is extensive abnormality in the soft tissues along the lateral abdominal and pelvic walls bilaterally, more severe on the right than on the left. There are hematomas which are well-defined lateral to the right greater trochanter. There is no air-fluid level suggesting abscess. There are moderate pleural effusions bilaterally with bibasilar lung consolidation. No pneumothorax evident in visualized regions. Calculi in each kidney. Areas of scarring and atrophy in left kidney. No hydronephrosis. Note that there is thickening of the urinary bladder wall in spite of catheterization. Suspect a degree of  cystitis. Fluid in the perirectal region and distal sigmoid colon region with soft tissue stranding in this area. This finding may be due to a degree of proctitis/distal colitis. Trauma involving the rectum cannot be excluded, although there is no ectopic air in this area or oral contrast extravasation. This finding may warrant sigmoidoscopy and proctoscopy to directly visualize these areas an assess for potential localized trauma. There are sigmoid diverticula without diverticulitis. No bowel wall thickening elsewhere noted. There is aortoiliac atherosclerosis. Aorta appears intact. There is no periaortic or major vessel perivascular fluid. There is a right femoral hernia containing only fat. Electronically Signed   By: Bretta Bang III M.D.   On: 05/04/2016 10:45   Dg Chest 1 View  Result Date: 05/05/2016 CLINICAL DATA:  Status post right thoracentesis. EXAM: CHEST 1 VIEW COMPARISON:  04/30/2016 FINDINGS: No residual right pleural fluid is seen.  No right pneumothorax. Mild hazy opacities noted in the medial left lung base which is likely combination of a small effusion and atelectasis. Remainder of the lungs is clear. Stable changes from cardiac surgery. Left anterior chest wall sequential pacemaker is stable. IMPRESSION: 1. No pneumothorax or evidence of a complication following right thoracentesis. Electronically Signed   By: Amie Portland M.D.   On: 05/05/2016 14:51   Dg Chest 2 View  Result Date: 04/30/2016 CLINICAL DATA:  82 year old female with fall EXAM: CHEST  2 VIEW COMPARISON:  Chest radiograph dated 09/05/2008 FINDINGS: There is emphysematous changes of the lungs. No focal consolidation, pleural effusion, or pneumothorax. The cardiac silhouette is within normal limits. The aorta  is mildly tortuous. Median sternotomy wires and left pectoral dual lead pacemaker device noted. There is osteopenia with degenerative changes of the spine. No acute fracture. IMPRESSION: No acute cardiopulmonary  process. Emphysema. Electronically Signed   By: Elgie Collard M.D.   On: 04/30/2016 23:43   Dg Pelvis 1-2 Views  Result Date: 04/30/2016 CLINICAL DATA:  Larey Seat at home at 17:00 today. Generalized weakness for several days. EXAM: PELVIS - 1-2 VIEW COMPARISON:  None. FINDINGS: There is no evidence of pelvic fracture or diastasis. No pelvic bone lesions are seen. IMPRESSION: Negative. Electronically Signed   By: Ellery Plunk M.D.   On: 04/30/2016 23:43   Ct Head Wo Contrast  Result Date: 04/30/2016 CLINICAL DATA:  Larey Seat at 17:00 today. Generalized weakness for several days. EXAM: CT HEAD WITHOUT CONTRAST TECHNIQUE: Contiguous axial images were obtained from the base of the skull through the vertex without intravenous contrast. COMPARISON:  None. FINDINGS: Brain: There is no intracranial hemorrhage, mass or evidence of acute infarction. There is moderate generalized atrophy. There is moderate chronic microvascular ischemic change. There is no significant extra-axial fluid collection. No acute intracranial findings are evident. Vascular: No hyperdense vessel or unexpected calcification. Skull: Normal. Negative for fracture or focal lesion. Sinuses/Orbits: No acute finding. Other: None. IMPRESSION: No acute intracranial findings. There is moderate generalized atrophy and chronic appearing white matter hypodensities which likely represent small vessel ischemic disease. Electronically Signed   By: Ellery Plunk M.D.   On: 04/30/2016 23:29   [place] Chest  Result Date: 05/05/2016 CLINICAL DATA:  Pleural effusion. EXAM: CHEST ULTRASOUND COMPARISON:  Chest x-ray 04/30/2016 . FINDINGS: Small bilateral pleural effusions are noted. Thoracentesis was not performed because patient was unable to sit up or lay on her side. IMPRESSION: Small bilateral pleural effusions. Thoracentesis not performed as above . Electronically Signed   By: Maisie Fus  Register   On: 05/05/2016 10:35   Dg Swallowing Func-speech  Pathology  Result Date: 05/01/2016 Objective Swallowing Evaluation: Type of Study: MBS-Modified Barium Swallow Study Patient Details Name: MARDENE LESSIG MRN: [id number] Date of Birth: 01/16/1925 Today's Date: 05/01/2016 Time: SLP Start Time (ACUTE ONLY): 1440-SLP Stop Time (ACUTE ONLY): 1510 SLP Time Calculation (min) (ACUTE ONLY): 30 min Past Medical History: Past Medical History: Diagnosis Date . Arthritis  . ARTHRITIS, RHEUMATOID, HX OF 01/06/2007 . Cervical stenosis of spine  . HTN (hypertension)  . Hyperlipidemia  . MITRAL REGURGITATION 01/06/2007 . OSTEOARTHRITIS 01/14/2007 . OSTEOPOROSIS 01/14/2007 . Other specified forms of hearing loss 08/01/2009 . PACEMAKER, PERMANENT 01/06/2007 . Raynaud's syndrome 01/06/2007 . Past Surgical History: Past Surgical History: Procedure Laterality Date . ABDOMINAL HYSTERECTOMY   . APPENDECTOMY   . CATARACT EXTRACTION, BILATERAL   . MITRAL VALVE REPAIR   . ovarian tumor, benign   . PACEMAKER INSERTION   HPI: 82 year old female admitted s/p fall at home, presenting with respiratory failure with hypoxia, question early developing aspiration PNA, severe throat, diarrhea. CXR negative. No Data Recorded Assessment / Plan / Recommendation CHL IP CLINICAL IMPRESSIONS 05/01/2016 Therapy Diagnosis Severe pharyngeal phase dysphagia Clinical Impression MBS complete. Patient presents with a severe pharyngeal dysphagia characterized by gross weakness resulting in decreased pharyngeal clearance, severe pharyngeal residuals, and decreased airway protection across consistencies. Patient unable to utilize compensatory strategies in attempts to facilitate decreased penetration/aspiration due to AMS. Unclear origin of dysphagia at this time. Question some degree of baseline dysphagia, now exacerbated by acute condition and/or relation to sore throat. Education complete with patient's neice. Will continue to f/u for  differential diagnosis, readiness for pos at bedside.  Impact on safety and  function Severe aspiration risk   CHL IP TREATMENT RECOMMENDATION 05/01/2016 Treatment Recommendations Therapy as outlined in treatment plan below   Prognosis 05/01/2016 Prognosis for Safe Diet Advancement Good Barriers to Reach Goals Severity of deficits Barriers/Prognosis Comment -- CHL IP DIET RECOMMENDATION 05/01/2016 SLP Diet Recommendations NPO;Alternative means - temporary Liquid Administration via -- Medication Administration Via alternative means Compensations -- Postural Changes --   CHL IP OTHER RECOMMENDATIONS 05/01/2016 Recommended Consults -- Oral Care Recommendations Oral care QID Other Recommendations --   CHL IP FOLLOW UP RECOMMENDATIONS 05/01/2016 Follow up Recommendations Other (comment)   CHL IP FREQUENCY AND DURATION 05/01/2016 Speech Therapy Frequency (ACUTE ONLY) min 3x week Treatment Duration 2 weeks      CHL IP ORAL PHASE 05/01/2016 Oral Phase WFL Oral - Pudding Teaspoon -- Oral - Pudding Cup -- Oral - Honey Teaspoon -- Oral - Honey Cup -- Oral - Nectar Teaspoon -- Oral - Nectar Cup -- Oral - Nectar Straw -- Oral - Thin Teaspoon -- Oral - Thin Cup -- Oral - Thin Straw -- Oral - Puree -- Oral - Mech Soft -- Oral - Regular -- Oral - Multi-Consistency -- Oral - Pill -- Oral Phase - Comment --  CHL IP PHARYNGEAL PHASE 05/01/2016 Pharyngeal Phase Impaired Pharyngeal- Pudding Teaspoon -- Pharyngeal -- Pharyngeal- Pudding Cup -- Pharyngeal -- Pharyngeal- Honey Teaspoon -- Pharyngeal -- Pharyngeal- Honey Cup -- Pharyngeal -- Pharyngeal- Nectar Teaspoon Delayed swallow initiation-vallecula;Delayed swallow initiation-pyriform sinuses;Reduced pharyngeal peristalsis;Reduced epiglottic inversion;Reduced anterior laryngeal mobility;Reduced laryngeal elevation;Reduced airway/laryngeal closure;Reduced tongue base retraction;Penetration/Aspiration before swallow;Penetration/Aspiration during swallow;Penetration/Apiration after swallow;Moderate aspiration;Pharyngeal residue - valleculae;Pharyngeal residue -  pyriform Pharyngeal Material enters airway, passes BELOW cords without attempt by patient to eject out (silent aspiration) Pharyngeal- Nectar Cup -- Pharyngeal -- Pharyngeal- Nectar Straw -- Pharyngeal -- Pharyngeal- Thin Teaspoon Delayed swallow initiation-vallecula;Delayed swallow initiation-pyriform sinuses;Reduced pharyngeal peristalsis;Reduced epiglottic inversion;Reduced anterior laryngeal mobility;Reduced laryngeal elevation;Reduced airway/laryngeal closure;Reduced tongue base retraction;Penetration/Aspiration before swallow;Penetration/Aspiration during swallow;Penetration/Apiration after swallow;Moderate aspiration;Pharyngeal residue - valleculae;Pharyngeal residue - pyriform Pharyngeal Material enters airway, passes BELOW cords without attempt by patient to eject out (silent aspiration) Pharyngeal- Thin Cup -- Pharyngeal -- Pharyngeal- Thin Straw -- Pharyngeal -- Pharyngeal- Puree Delayed swallow initiation-vallecula;Delayed swallow initiation-pyriform sinuses;Reduced pharyngeal peristalsis;Reduced epiglottic inversion;Reduced anterior laryngeal mobility;Reduced laryngeal elevation;Reduced airway/laryngeal closure;Reduced tongue base retraction;Penetration/Aspiration before swallow;Penetration/Aspiration during swallow;Penetration/Apiration after swallow;Pharyngeal residue - valleculae;Pharyngeal residue - pyriform Pharyngeal Material enters airway, CONTACTS cords and not ejected out Pharyngeal- Mechanical Soft -- Pharyngeal -- Pharyngeal- Regular -- Pharyngeal -- Pharyngeal- Multi-consistency -- Pharyngeal -- Pharyngeal- Pill -- Pharyngeal -- Pharyngeal Comment --  Ferdinand Lango MA, CCC-SLP [phone] McCoy Leah Meryl 05/01/2016, 3:26 PM              US Thoracentesis Asp Pleural Space W/img Guide  Result Date: 05/05/2016 INDICATION: Patient admitted with acute respiratory failure and hypoxia secondary to parainfluenza virus. Small bilateral pleural effusions noted. Request is made for thoracentesis  of largest effusion. EXAM: ULTRASOUND GUIDED DIAGNOSTIC AND THERAPEUTIC THORACENTESIS MEDICATIONS: 1% lidocaine COMPLICATIONS: None immediate. PROCEDURE: An ultrasound guided thoracentesis was thoroughly discussed with the patient and her niece and questions answered. The benefits, risks, alternatives and complications were also discussed. The patient and her niece understands and wishes to proceed with the procedure. Written consent was obtained. Ultrasound was performed to localize and mark an adequate pocket of fluid in the right chest. The area was then prepped and draped in the normal sterile fashion. 1% Lidocaine was  used for local anesthesia. Under ultrasound guidance a Safe-T-Centesis catheter was introduced. Thoracentesis was performed. The catheter was removed and a dressing applied. FINDINGS: A total of approximately 0.28 L of serosanguineous fluid was removed. Samples were sent to the laboratory as requested by the clinical team. IMPRESSION: Successful ultrasound guided right thoracentesis yielding 0.28 L of pleural fluid. Read by: Barnetta Chapel, PA-C Electronically Signed   By: Corlis Leak M.D.   On: 05/05/2016 15:11    Allexa Acoff, DO  [facility] Pager [phone]  If 7PM-7AM, please contact night-coverage [email or web address] Password [email or web address] 05/06/2016, 4:15 PM   LOS: 5 days

## 2016-05-06 NOTE — Progress Notes (Signed)
Physical Therapy Treatment Patient Details Name: Alexis Cobb MRN: [id number] DOB: 10-23-1924 Today's Date: 05/06/2016    History of Present Illness 81 yo female admitted with acute respiratory failure, fall at home and CT showing multiple pelvic fractures. Hx of pacemaker, Oa, HTN, osteoporosis, RA    PT Comments    Pt unable to tolerate bed mobility, but did tolerate gentle supine therex including hip abd, heel slides, ankle pumps, and glut sets.  Pt left with heels floating and SCDs.  Educated on importance of changing positions. Con't to recommend SNF.  Follow Up Recommendations  SNF     Equipment Recommendations  None recommended by PT    Recommendations for Other Services       Precautions / Restrictions Precautions Precautions: Fall Restrictions Weight Bearing Restrictions: No    Mobility  Bed Mobility               General bed mobility comments: Repositioned bed with controls to change back and leg position  Transfers                    Ambulation/Gait                 Stairs            Wheelchair Mobility    Modified Rankin (Stroke Patients Only)       Balance                                    Cognition Arousal/Alertness: Awake/alert (but sleepy) Behavior During Therapy: WFL for tasks assessed/performed Overall Cognitive Status: No family/caregiver present to determine baseline cognitive functioning                      Exercises General Exercises - Lower Extremity Ankle Circles/Pumps: AROM;Both;Supine Quad Sets: Strengthening;Both;5 reps;Supine Gluteal Sets: Strengthening;Supine Hip ABduction/ADduction: AAROM;Both;10 reps;Supine    General Comments        Pertinent Vitals/Pain Pain Assessment: Faces Faces Pain Scale: Hurts even more Pain Location: back Pain Descriptors / Indicators: Grimacing;Discomfort Pain Intervention(s): Limited activity within patient's tolerance;Monitored  during session;Repositioned    Home Living                      Prior Function            PT Goals (current goals can now be found in the care plan section) Acute Rehab PT Goals Time For Goal Achievement: 05/23/16 Potential to Achieve Goals: Fair Progress towards PT goals: Progressing toward goals    Frequency    Min 3X/week      PT Plan Current plan remains appropriate    Co-evaluation             End of Session   Activity Tolerance: Patient limited by fatigue;Patient limited by pain Patient left: in bed;with call bell/phone within reach;with bed alarm set;with SCD's reapplied     Time: 2505-3976 PT Time Calculation (min) (ACUTE ONLY): 18 min  Charges:  $Therapeutic Exercise: 8-22 mins                    G Codes:      Jarmon Javid LUBECK 05/06/2016, 10:26 AM

## 2016-05-06 NOTE — Progress Notes (Signed)
[facility] completed admission paperwork 12/27. LCSWA will continue to follow and assist with disposition once medically stable.

## 2016-05-06 NOTE — Progress Notes (Signed)
Speech Language Pathology Treatment: Dysphagia  Patient Details Name: AUBRIE LUCIEN MRN: [id number] DOB: 16-Dec-1924 Today's Date: 05/06/2016 Time: 9038-3338 SLP Time Calculation (min) (ACUTE ONLY): 17 min  Assessment / Plan / Recommendation Clinical Impression  Pt consuming nectar-thickened liquids/Dysphagia 1 diet when SLP arrived with family; education provided re: safety in administering current diet re: swallowing precautions of small sips/bites, no straws, upright as much as possible without pain, alert during po intake, etc.  Family in agreement; want to postpone MBS until pt can perform at her optimal level, so OP MBS possibility discussed after receiving ST at Rehab facility; family reassured she would receive ST at SNF and they could monitor her diet as well and upgrade if they felt she had improved with dysphagia. ST will f/u x1 if pt remains in house for diet tolerance.   HPI HPI: 82 year old female admitted s/p fall at home, presenting with respiratory failure with hypoxia, question early developing aspiration PNA, severe throat, diarrhea. CXR negative. BSE/MBS completed 05/01/16 with recommendation for NPO status. New orders received following Palliative Care Family meeting.      SLP Plan  Continue with current plan of care     Recommendations  Diet recommendations: Dysphagia 1 (puree);Nectar-thick liquid Liquids provided via: Teaspoon Medication Administration: Crushed with puree Supervision: Patient able to self feed;Staff to assist with self feeding Compensations: Minimize environmental distractions;Slow rate;Small sips/bites Postural Changes and/or Swallow Maneuvers: Seated upright 90 degrees                Oral Care Recommendations: Oral care QID Follow up Recommendations: Other (comment) Plan: Continue with current plan of care                      ADAMS,PAT, M.S., CCC-SLP 05/06/2016, 3:30 PM

## 2016-05-07 DIAGNOSIS — M4802 Spinal stenosis, cervical region: Secondary | ICD-10-CM | POA: Diagnosis not present

## 2016-05-07 DIAGNOSIS — Z23 Encounter for immunization: Secondary | ICD-10-CM | POA: Diagnosis not present

## 2016-05-07 DIAGNOSIS — F0390 Unspecified dementia without behavioral disturbance: Secondary | ICD-10-CM | POA: Diagnosis not present

## 2016-05-07 DIAGNOSIS — Z9181 History of falling: Secondary | ICD-10-CM | POA: Diagnosis not present

## 2016-05-07 DIAGNOSIS — L89621 Pressure ulcer of left heel, stage 1: Secondary | ICD-10-CM | POA: Diagnosis not present

## 2016-05-07 DIAGNOSIS — S329XXA Fracture of unspecified parts of lumbosacral spine and pelvis, initial encounter for closed fracture: Secondary | ICD-10-CM | POA: Diagnosis not present

## 2016-05-07 DIAGNOSIS — R140 Abdominal distension (gaseous): Secondary | ICD-10-CM | POA: Diagnosis not present

## 2016-05-07 DIAGNOSIS — I2510 Atherosclerotic heart disease of native coronary artery without angina pectoris: Secondary | ICD-10-CM | POA: Diagnosis not present

## 2016-05-07 DIAGNOSIS — M6281 Muscle weakness (generalized): Secondary | ICD-10-CM | POA: Diagnosis not present

## 2016-05-07 DIAGNOSIS — B348 Other viral infections of unspecified site: Secondary | ICD-10-CM | POA: Diagnosis not present

## 2016-05-07 DIAGNOSIS — I10 Essential (primary) hypertension: Secondary | ICD-10-CM | POA: Diagnosis not present

## 2016-05-07 DIAGNOSIS — J9601 Acute respiratory failure with hypoxia: Secondary | ICD-10-CM | POA: Diagnosis not present

## 2016-05-07 DIAGNOSIS — W010XXA Fall on same level from slipping, tripping and stumbling without subsequent striking against object, initial encounter: Secondary | ICD-10-CM | POA: Diagnosis not present

## 2016-05-07 DIAGNOSIS — E119 Type 2 diabetes mellitus without complications: Secondary | ICD-10-CM | POA: Diagnosis not present

## 2016-05-07 DIAGNOSIS — D649 Anemia, unspecified: Secondary | ICD-10-CM | POA: Diagnosis not present

## 2016-05-07 DIAGNOSIS — I4891 Unspecified atrial fibrillation: Secondary | ICD-10-CM | POA: Diagnosis not present

## 2016-05-07 DIAGNOSIS — R509 Fever, unspecified: Secondary | ICD-10-CM | POA: Diagnosis not present

## 2016-05-07 DIAGNOSIS — Y92003 Bedroom of unspecified non-institutional (private) residence as the place of occurrence of the external cause: Secondary | ICD-10-CM | POA: Diagnosis not present

## 2016-05-07 DIAGNOSIS — S42302A Unspecified fracture of shaft of humerus, left arm, initial encounter for closed fracture: Secondary | ICD-10-CM | POA: Diagnosis not present

## 2016-05-07 DIAGNOSIS — J90 Pleural effusion, not elsewhere classified: Secondary | ICD-10-CM | POA: Diagnosis not present

## 2016-05-07 DIAGNOSIS — N390 Urinary tract infection, site not specified: Secondary | ICD-10-CM | POA: Diagnosis not present

## 2016-05-07 DIAGNOSIS — R1312 Dysphagia, oropharyngeal phase: Secondary | ICD-10-CM | POA: Diagnosis not present

## 2016-05-07 DIAGNOSIS — R2681 Unsteadiness on feet: Secondary | ICD-10-CM | POA: Diagnosis not present

## 2016-05-07 DIAGNOSIS — D62 Acute posthemorrhagic anemia: Secondary | ICD-10-CM | POA: Diagnosis not present

## 2016-05-07 DIAGNOSIS — R109 Unspecified abdominal pain: Secondary | ICD-10-CM | POA: Diagnosis not present

## 2016-05-07 DIAGNOSIS — J690 Pneumonitis due to inhalation of food and vomit: Secondary | ICD-10-CM | POA: Diagnosis not present

## 2016-05-07 DIAGNOSIS — R2689 Other abnormalities of gait and mobility: Secondary | ICD-10-CM | POA: Diagnosis not present

## 2016-05-07 DIAGNOSIS — R05 Cough: Secondary | ICD-10-CM | POA: Diagnosis not present

## 2016-05-07 DIAGNOSIS — R259 Unspecified abnormal involuntary movements: Secondary | ICD-10-CM | POA: Diagnosis not present

## 2016-05-07 DIAGNOSIS — R279 Unspecified lack of coordination: Secondary | ICD-10-CM | POA: Diagnosis not present

## 2016-05-07 DIAGNOSIS — I495 Sick sinus syndrome: Secondary | ICD-10-CM | POA: Diagnosis not present

## 2016-05-07 LAB — BASIC METABOLIC PANEL WITH GFR
Anion gap: 7 (ref 5–15)
BUN: 18 mg/dL (ref 6–20)
CO2: 32 mmol/L (ref 22–32)
Calcium: 8.7 mg/dL — ABNORMAL LOW (ref 8.9–10.3)
Chloride: 102 mmol/L (ref 101–111)
Creatinine, Ser: 0.69 mg/dL (ref 0.44–1.00)
GFR calc Af Amer: >60 mL/min
GFR calc non Af Amer: >60 mL/min
Glucose, Bld: 97 mg/dL (ref 65–99)
Potassium: 3.3 mmol/L — ABNORMAL LOW (ref 3.5–5.1)
Sodium: 141 mmol/L (ref 135–145)

## 2016-05-07 LAB — CBC
HCT: 32.1 % — ABNORMAL LOW (ref 36.0–46.0)
Hemoglobin: 10.7 g/dL — ABNORMAL LOW (ref 12.0–15.0)
MCH: 31 pg (ref 26.0–34.0)
MCHC: 33.3 g/dL (ref 30.0–36.0)
MCV: 93 fL (ref 78.0–100.0)
Platelets: 246 K/uL (ref 150–400)
RBC: 3.45 MIL/uL — ABNORMAL LOW (ref 3.87–5.11)
RDW: 17.7 % — ABNORMAL HIGH (ref 11.5–15.5)
WBC: 10.2 K/uL (ref 4.0–10.5)

## 2016-05-07 LAB — PROCALCITONIN: Procalcitonin: <0.1 ng/mL

## 2016-05-07 MED ORDER — POTASSIUM CHLORIDE CRYS ER 20 MEQ PO TBCR
20.0000 meq | EXTENDED_RELEASE_TABLET | Freq: Once | ORAL | Status: AC
  Administered 2016-05-07: 20 meq via ORAL
  Filled 2016-05-07: qty 1

## 2016-05-07 NOTE — Progress Notes (Signed)
Report called to [facility].

## 2016-05-07 NOTE — Clinical Social Work Placement (Addendum)
   CLINICAL SOCIAL WORK PLACEMENT  NOTE  Date:  05/07/2016  Patient Details  Name: Alexis Cobb MRN: [id number] Date of Birth: [birth year]  Clinical Social Work is seeking post-discharge placement for this patient at the Skilled  Nursing Facility level of care (*CSW will initial, date and re-position this form in  chart as items are completed):  Yes   Patient/family provided with [facility] Clinical Social Work Department's list of facilities offering this level of care within the geographic area requested by the patient (or if unable, by the patient's family).  Yes   Patient/family informed of their freedom to choose among providers that offer the needed level of care, that participate in Medicare, Medicaid or managed care program needed by the patient, have an available bed and are willing to accept the patient.  Yes   Patient/family informed of [facility]'s ownership interest in [facility] and [facility], as well as of the fact that they are under no obligation to receive care at these facilities.  PASRR submitted to EDS on 05/07/16     PASRR number received on 05/07/16     Existing PASRR number confirmed on       FL2 transmitted to all facilities in geographic area requested by pt/family on       FL2 transmitted to all facilities within larger geographic area on       Patient informed that his/her managed care company has contracts with or will negotiate with certain facilities, including the following:  [facility]     Yes   Patient/family informed of bed offers received.  Patient chooses bed at [facility]     Physician recommends and patient chooses bed at [facility]    Patient to be transferred to [facility] on 05/07/16.  Patient to be transferred to facility by [facility]AR     Patient family notified on 05/07/16 of transfer.  Name of family member notified:      Education officer, community and spoke with Charmayne Sheer  PHYSICIAN Please sign FL2, Please prepare priority discharge summary, including medications     Additional Comment:    _______________________________________________ Clearance Coots, LCSW 05/07/2016, 10:20 AM

## 2016-05-10 DIAGNOSIS — S42302A Unspecified fracture of shaft of humerus, left arm, initial encounter for closed fracture: Secondary | ICD-10-CM | POA: Diagnosis not present

## 2016-05-10 DIAGNOSIS — N390 Urinary tract infection, site not specified: Secondary | ICD-10-CM | POA: Diagnosis not present

## 2016-05-10 DIAGNOSIS — E119 Type 2 diabetes mellitus without complications: Secondary | ICD-10-CM | POA: Diagnosis not present

## 2016-05-10 DIAGNOSIS — F0390 Unspecified dementia without behavioral disturbance: Secondary | ICD-10-CM | POA: Diagnosis not present

## 2016-05-10 DIAGNOSIS — I2510 Atherosclerotic heart disease of native coronary artery without angina pectoris: Secondary | ICD-10-CM | POA: Diagnosis not present

## 2016-05-10 DIAGNOSIS — I4891 Unspecified atrial fibrillation: Secondary | ICD-10-CM | POA: Diagnosis not present

## 2016-05-10 LAB — CULTURE, BODY FLUID-BOTTLE: CULTURE: NO GROWTH

## 2016-05-10 LAB — CULTURE, BODY FLUID W GRAM STAIN -BOTTLE

## 2016-05-15 DIAGNOSIS — R05 Cough: Secondary | ICD-10-CM | POA: Diagnosis not present

## 2016-05-17 ENCOUNTER — Other Ambulatory Visit: Payer: Self-pay | Admitting: Internal Medicine

## 2016-05-18 DIAGNOSIS — L89621 Pressure ulcer of left heel, stage 1: Secondary | ICD-10-CM | POA: Diagnosis not present

## 2016-05-20 DIAGNOSIS — R509 Fever, unspecified: Secondary | ICD-10-CM | POA: Diagnosis not present

## 2016-05-25 DIAGNOSIS — L89621 Pressure ulcer of left heel, stage 1: Secondary | ICD-10-CM | POA: Diagnosis not present

## 2016-05-26 ENCOUNTER — Encounter: Payer: Medicare Other | Admitting: *Deleted

## 2016-05-26 DIAGNOSIS — R109 Unspecified abdominal pain: Secondary | ICD-10-CM | POA: Diagnosis not present

## 2016-05-28 ENCOUNTER — Encounter: Payer: Self-pay | Admitting: Cardiology

## 2016-05-29 DIAGNOSIS — R140 Abdominal distension (gaseous): Secondary | ICD-10-CM | POA: Diagnosis not present

## 2016-05-29 DIAGNOSIS — D649 Anemia, unspecified: Secondary | ICD-10-CM | POA: Diagnosis not present

## 2016-05-29 DIAGNOSIS — S329XXA Fracture of unspecified parts of lumbosacral spine and pelvis, initial encounter for closed fracture: Secondary | ICD-10-CM | POA: Diagnosis not present

## 2016-06-01 DIAGNOSIS — L89621 Pressure ulcer of left heel, stage 1: Secondary | ICD-10-CM | POA: Diagnosis not present

## 2016-06-08 ENCOUNTER — Telehealth: Payer: Self-pay | Admitting: Cardiology

## 2016-06-08 ENCOUNTER — Ambulatory Visit (INDEPENDENT_AMBULATORY_CARE_PROVIDER_SITE_OTHER): Payer: Medicare Other | Admitting: *Deleted

## 2016-06-08 DIAGNOSIS — I495 Sick sinus syndrome: Secondary | ICD-10-CM

## 2016-06-08 DIAGNOSIS — L89621 Pressure ulcer of left heel, stage 1: Secondary | ICD-10-CM | POA: Diagnosis not present

## 2016-06-08 NOTE — Telephone Encounter (Signed)
Spoke w/ pt niece and helped her send a remote transmission w/ pt home monitor. Transmission received. Pt niece aware. If there is any issues please call her niece at [phone] or [phone] b/c pt is not home at this time.

## 2016-06-15 DIAGNOSIS — L89621 Pressure ulcer of left heel, stage 1: Secondary | ICD-10-CM | POA: Diagnosis not present

## 2016-06-15 NOTE — Progress Notes (Signed)
Remote pacemaker transmission.   

## 2016-06-16 ENCOUNTER — Encounter: Payer: Self-pay | Admitting: Cardiology

## 2016-06-20 LAB — CUP PACEART REMOTE DEVICE CHECK
Battery Remaining Longevity: 6 mo
Date Time Interrogation Session: 20180130213812
Implantable Lead Implant Date: 20020104
Implantable Lead Location: 753859
Implantable Pulse Generator Implant Date: 20100429
Lead Channel Pacing Threshold Amplitude: 1.625 V
Lead Channel Pacing Threshold Pulse Width: 0.4 ms
Lead Channel Pacing Threshold Pulse Width: 0.4 ms
Lead Channel Setting Pacing Amplitude: 2 V
Lead Channel Setting Sensing Sensitivity: 2 mV
MDC IDC LEAD IMPLANT DT: 20020104
MDC IDC LEAD LOCATION: 753860
MDC IDC MSMT BATTERY IMPEDANCE: 3957 Ohm
MDC IDC MSMT BATTERY VOLTAGE: 2.69 V
MDC IDC MSMT LEADCHNL RA IMPEDANCE VALUE: 363 Ohm
MDC IDC MSMT LEADCHNL RA PACING THRESHOLD AMPLITUDE: 0.625 V
MDC IDC MSMT LEADCHNL RV IMPEDANCE VALUE: 384 Ohm
MDC IDC SET LEADCHNL RV PACING AMPLITUDE: 3.25 V
MDC IDC SET LEADCHNL RV PACING PULSEWIDTH: 0.4 ms
MDC IDC STAT BRADY AP VP PERCENT: 50 %
MDC IDC STAT BRADY AP VS PERCENT: 0 %
MDC IDC STAT BRADY AS VP PERCENT: 50 %
MDC IDC STAT BRADY AS VS PERCENT: 0 %

## 2016-06-22 DIAGNOSIS — L89621 Pressure ulcer of left heel, stage 1: Secondary | ICD-10-CM | POA: Diagnosis not present

## 2016-07-05 DIAGNOSIS — H5213 Myopia, bilateral: Secondary | ICD-10-CM | POA: Diagnosis not present

## 2016-07-09 DIAGNOSIS — R2681 Unsteadiness on feet: Secondary | ICD-10-CM | POA: Diagnosis not present

## 2016-07-09 DIAGNOSIS — M6281 Muscle weakness (generalized): Secondary | ICD-10-CM | POA: Diagnosis not present

## 2016-07-12 DIAGNOSIS — M6281 Muscle weakness (generalized): Secondary | ICD-10-CM | POA: Diagnosis not present

## 2016-07-12 DIAGNOSIS — R2681 Unsteadiness on feet: Secondary | ICD-10-CM | POA: Diagnosis not present

## 2016-07-14 DIAGNOSIS — M6281 Muscle weakness (generalized): Secondary | ICD-10-CM | POA: Diagnosis not present

## 2016-07-14 DIAGNOSIS — R2681 Unsteadiness on feet: Secondary | ICD-10-CM | POA: Diagnosis not present

## 2016-07-16 DIAGNOSIS — M6281 Muscle weakness (generalized): Secondary | ICD-10-CM | POA: Diagnosis not present

## 2016-07-16 DIAGNOSIS — R2681 Unsteadiness on feet: Secondary | ICD-10-CM | POA: Diagnosis not present

## 2016-07-19 DIAGNOSIS — R2681 Unsteadiness on feet: Secondary | ICD-10-CM | POA: Diagnosis not present

## 2016-07-19 DIAGNOSIS — M6281 Muscle weakness (generalized): Secondary | ICD-10-CM | POA: Diagnosis not present

## 2016-07-21 DIAGNOSIS — M6281 Muscle weakness (generalized): Secondary | ICD-10-CM | POA: Diagnosis not present

## 2016-07-21 DIAGNOSIS — R2681 Unsteadiness on feet: Secondary | ICD-10-CM | POA: Diagnosis not present

## 2016-07-22 DIAGNOSIS — M6281 Muscle weakness (generalized): Secondary | ICD-10-CM | POA: Diagnosis not present

## 2016-07-22 DIAGNOSIS — R2681 Unsteadiness on feet: Secondary | ICD-10-CM | POA: Diagnosis not present

## 2016-07-26 DIAGNOSIS — M6281 Muscle weakness (generalized): Secondary | ICD-10-CM | POA: Diagnosis not present

## 2016-07-26 DIAGNOSIS — R2681 Unsteadiness on feet: Secondary | ICD-10-CM | POA: Diagnosis not present

## 2016-07-28 DIAGNOSIS — M6281 Muscle weakness (generalized): Secondary | ICD-10-CM | POA: Diagnosis not present

## 2016-07-28 DIAGNOSIS — R2681 Unsteadiness on feet: Secondary | ICD-10-CM | POA: Diagnosis not present

## 2016-07-30 ENCOUNTER — Encounter (INDEPENDENT_AMBULATORY_CARE_PROVIDER_SITE_OTHER): Payer: Self-pay | Admitting: Orthopaedic Surgery

## 2016-07-30 ENCOUNTER — Ambulatory Visit (INDEPENDENT_AMBULATORY_CARE_PROVIDER_SITE_OTHER): Payer: Medicare Other | Admitting: Orthopaedic Surgery

## 2016-07-30 ENCOUNTER — Ambulatory Visit (INDEPENDENT_AMBULATORY_CARE_PROVIDER_SITE_OTHER): Payer: Self-pay

## 2016-07-30 VITALS — BP 142/79 | HR 85 | Ht 64.0 in | Wt 100.0 lb

## 2016-07-30 DIAGNOSIS — R102 Pelvic and perineal pain: Secondary | ICD-10-CM | POA: Diagnosis not present

## 2016-07-30 DIAGNOSIS — S32810S Multiple fractures of pelvis with stable disruption of pelvic ring, sequela: Secondary | ICD-10-CM

## 2016-07-30 DIAGNOSIS — M6281 Muscle weakness (generalized): Secondary | ICD-10-CM | POA: Diagnosis not present

## 2016-07-30 DIAGNOSIS — R2681 Unsteadiness on feet: Secondary | ICD-10-CM | POA: Diagnosis not present

## 2016-07-30 NOTE — Progress Notes (Signed)
Office Visit Note   Patient: Alexis Cobb           Date of Birth: 04-Jul-1924           MRN: [id number] Visit Date: 07/30/2016              Requested by: Biagio Borg, MD [address], [place] [address] PCP: Cathlean Cower, MD   Assessment & Plan: Visit Diagnoses:  1. Pelvic pain   2. Multiple closed fractures of pelvis with disruption of pelvic ring, sequela        Healed the right pelvic fractures.  Plan: Patient's a mature with the walker she's not having any pain. X-rays demonstrate interval healing of the fractures and she is released from care and can return as needed.  Follow-Up Instructions: Return if symptoms worsen or fail to improve.   Orders:  Orders Placed This Encounter  Procedures  . XR Pelvis 1-2 Views   No orders of the defined types were placed in this encounter.     Procedures: No procedures performed   Clinical Data: No additional findings.   Subjective: Chief Complaint  Patient presents with  . Pelvis - Fracture    Patient presents after fall on 04/30/2017. She was seen at [facility] and had multiple pelvic fractures. Per her niece who is with her today, the patient cannot remember a lot of what went on at that time. She would like to know the extent of her injuries and what that means for the future. She ambulates with a walker today and denies pain. The pain was extremely bad at first. She had to be given blood in the hospital due to internal bleeding. Per her niece, Dr. Lorin Mercy consulted with the ED physicians, but the patient never met him. She was living alone at home and is now in assisted living.     Review of Systems 14 point review of systems updated and reviewed from her hospitalization and are unchanged as it pertained her history present illness. She's staying at [facility] and is here with her niece who gave additional information. She denies any pain with ambulation she is using a rolling  walker.  Objective: Vital Signs: BP (!) 142/79   Pulse 85   Ht 5' 4"  (1.626 m)   Wt 100 lb (45.4 kg)   BMI 17.16 kg/m   Physical Exam patient's hematuria is no areas of tenderness the pelvis. Denies pain with weightbearing or sitting. No audible wheezing no lymphadenopathy or rash overexposed skin no tenderness of the trochanters. She does have a pacemaker. No abdominal tenderness. Extraocular movements intact normal facial grimace and smile.  Ortho Exam  Specialty Comments:  No specialty comments available.  Imaging: Xr Pelvis 1-2 Views  Result Date: 07/30/2016 Pelvis x-ray obtained. This shows healing compared to CT scan 05/05/2016 of the right pubic rami and ischial tuberosity fracture. Impression: Healed pelvic fracture without displacement.    PMFS History: Patient Active Problem List   Diagnosis Date Noted  . Pleural effusion   . S/P thoracentesis   . Acute blood loss anemia   . Aspiration pneumonia ([place])   . Hemoglobin drop   . Shortness of breath   . Sore throat   . Anemia   . Parainfluenza virus pharyngitis 05/02/2016  . Infection due to parainfluenza virus 1 05/02/2016  . Acute respiratory failure with hypoxia ([place]) 05/01/2016  . Acute pharyngitis 05/01/2016  . Diarrhea 05/01/2016  . Rash 05/31/2015  .  Hives 12/10/2014  . Chronic venous insufficiency 12/10/2014  . Peripheral neuropathy ([place]) 12/12/2013  . Right shoulder pain 07/31/2012  . Anxiety state 07/31/2012  . Sinoatrial node dysfunction ([place]) 11/23/2011  . Polyarthropathy 12/31/2010  . Benign head tremor 12/31/2010  . Cervical radicular pain 12/31/2010  . Weakness 12/31/2010  . Preventative health care 11/27/2010  . OSTEOARTHRITIS 01/14/2007  . OSTEOPOROSIS 01/14/2007  . Hyperlipidemia 01/06/2007  . MITRAL REGURGITATION 01/06/2007  . Essential hypertension 01/06/2007  . Raynaud's syndrome 01/06/2007  . PACEMAKER, PERMANENT 01/06/2007   Past Medical History:  Diagnosis Date  . Arthritis    . ARTHRITIS, RHEUMATOID, HX OF 01/06/2007  . Cervical stenosis of spine   . HTN (hypertension)   . Hyperlipidemia   . MITRAL REGURGITATION 01/06/2007  . OSTEOARTHRITIS 01/14/2007  . OSTEOPOROSIS 01/14/2007  . Other specified forms of hearing loss 08/01/2009  . PACEMAKER, PERMANENT 01/06/2007  . Raynaud's syndrome 01/06/2007    Family History  Problem Relation Age of Onset  . Coronary artery disease    . Hypertension      Past Surgical History:  Procedure Laterality Date  . ABDOMINAL HYSTERECTOMY    . APPENDECTOMY    . CATARACT EXTRACTION, BILATERAL    . MITRAL VALVE REPAIR    . ovarian tumor, benign    . PACEMAKER INSERTION     Social History   Occupational History  . retired Radiation protection practitioner Retired   Social History Main Topics  . Smoking status: Never Smoker  . Smokeless tobacco: Never Used  . Alcohol use No  . Drug use: No  . Sexual activity: Not on file

## 2016-08-02 DIAGNOSIS — M6281 Muscle weakness (generalized): Secondary | ICD-10-CM | POA: Diagnosis not present

## 2016-08-02 DIAGNOSIS — R2681 Unsteadiness on feet: Secondary | ICD-10-CM | POA: Diagnosis not present

## 2016-08-04 DIAGNOSIS — R2681 Unsteadiness on feet: Secondary | ICD-10-CM | POA: Diagnosis not present

## 2016-08-04 DIAGNOSIS — M6281 Muscle weakness (generalized): Secondary | ICD-10-CM | POA: Diagnosis not present

## 2016-08-05 ENCOUNTER — Encounter: Payer: Medicare Other | Admitting: *Deleted

## 2016-08-05 ENCOUNTER — Telehealth: Payer: Self-pay | Admitting: Cardiology

## 2016-08-05 NOTE — Telephone Encounter (Addendum)
Attempted to confirm remote transmission with pt. No answer and was unable to leave a message.   

## 2016-08-06 ENCOUNTER — Encounter: Payer: Self-pay | Admitting: Cardiology

## 2016-08-06 DIAGNOSIS — R2681 Unsteadiness on feet: Secondary | ICD-10-CM | POA: Diagnosis not present

## 2016-08-06 DIAGNOSIS — M6281 Muscle weakness (generalized): Secondary | ICD-10-CM | POA: Diagnosis not present

## 2016-08-10 DIAGNOSIS — R2681 Unsteadiness on feet: Secondary | ICD-10-CM | POA: Diagnosis not present

## 2016-08-10 DIAGNOSIS — M6281 Muscle weakness (generalized): Secondary | ICD-10-CM | POA: Diagnosis not present

## 2016-08-11 DIAGNOSIS — R2681 Unsteadiness on feet: Secondary | ICD-10-CM | POA: Diagnosis not present

## 2016-08-11 DIAGNOSIS — M6281 Muscle weakness (generalized): Secondary | ICD-10-CM | POA: Diagnosis not present

## 2016-08-13 DIAGNOSIS — M6281 Muscle weakness (generalized): Secondary | ICD-10-CM | POA: Diagnosis not present

## 2016-08-13 DIAGNOSIS — R2681 Unsteadiness on feet: Secondary | ICD-10-CM | POA: Diagnosis not present

## 2016-08-16 DIAGNOSIS — M6281 Muscle weakness (generalized): Secondary | ICD-10-CM | POA: Diagnosis not present

## 2016-08-16 DIAGNOSIS — R2681 Unsteadiness on feet: Secondary | ICD-10-CM | POA: Diagnosis not present

## 2016-08-18 DIAGNOSIS — R2681 Unsteadiness on feet: Secondary | ICD-10-CM | POA: Diagnosis not present

## 2016-08-18 DIAGNOSIS — M6281 Muscle weakness (generalized): Secondary | ICD-10-CM | POA: Diagnosis not present

## 2016-08-20 DIAGNOSIS — M6281 Muscle weakness (generalized): Secondary | ICD-10-CM | POA: Diagnosis not present

## 2016-08-20 DIAGNOSIS — R2681 Unsteadiness on feet: Secondary | ICD-10-CM | POA: Diagnosis not present

## 2016-08-23 DIAGNOSIS — M6281 Muscle weakness (generalized): Secondary | ICD-10-CM | POA: Diagnosis not present

## 2016-08-23 DIAGNOSIS — R2681 Unsteadiness on feet: Secondary | ICD-10-CM | POA: Diagnosis not present

## 2016-08-25 DIAGNOSIS — M6281 Muscle weakness (generalized): Secondary | ICD-10-CM | POA: Diagnosis not present

## 2016-08-25 DIAGNOSIS — R2681 Unsteadiness on feet: Secondary | ICD-10-CM | POA: Diagnosis not present

## 2016-08-27 DIAGNOSIS — R2681 Unsteadiness on feet: Secondary | ICD-10-CM | POA: Diagnosis not present

## 2016-08-27 DIAGNOSIS — M6281 Muscle weakness (generalized): Secondary | ICD-10-CM | POA: Diagnosis not present

## 2016-08-30 DIAGNOSIS — M6281 Muscle weakness (generalized): Secondary | ICD-10-CM | POA: Diagnosis not present

## 2016-08-30 DIAGNOSIS — R2681 Unsteadiness on feet: Secondary | ICD-10-CM | POA: Diagnosis not present

## 2016-09-01 DIAGNOSIS — M6281 Muscle weakness (generalized): Secondary | ICD-10-CM | POA: Diagnosis not present

## 2016-09-01 DIAGNOSIS — R2681 Unsteadiness on feet: Secondary | ICD-10-CM | POA: Diagnosis not present

## 2016-09-03 DIAGNOSIS — M6281 Muscle weakness (generalized): Secondary | ICD-10-CM | POA: Diagnosis not present

## 2016-09-03 DIAGNOSIS — R2681 Unsteadiness on feet: Secondary | ICD-10-CM | POA: Diagnosis not present

## 2016-09-06 DIAGNOSIS — M6281 Muscle weakness (generalized): Secondary | ICD-10-CM | POA: Diagnosis not present

## 2016-09-06 DIAGNOSIS — R2681 Unsteadiness on feet: Secondary | ICD-10-CM | POA: Diagnosis not present

## 2016-09-08 DIAGNOSIS — M6281 Muscle weakness (generalized): Secondary | ICD-10-CM | POA: Diagnosis not present

## 2016-09-08 DIAGNOSIS — R2681 Unsteadiness on feet: Secondary | ICD-10-CM | POA: Diagnosis not present

## 2016-09-10 DIAGNOSIS — R2681 Unsteadiness on feet: Secondary | ICD-10-CM | POA: Diagnosis not present

## 2016-09-10 DIAGNOSIS — M6281 Muscle weakness (generalized): Secondary | ICD-10-CM | POA: Diagnosis not present

## 2016-09-13 DIAGNOSIS — M6281 Muscle weakness (generalized): Secondary | ICD-10-CM | POA: Diagnosis not present

## 2016-09-13 DIAGNOSIS — R2681 Unsteadiness on feet: Secondary | ICD-10-CM | POA: Diagnosis not present

## 2016-09-14 ENCOUNTER — Encounter: Payer: Medicare Other | Admitting: *Deleted

## 2016-09-14 ENCOUNTER — Telehealth: Payer: Self-pay | Admitting: Cardiology

## 2016-09-14 NOTE — Telephone Encounter (Signed)
Attempted to confirm remote transmission with pt. No answer and was unable to leave a message.   

## 2016-09-15 DIAGNOSIS — M6281 Muscle weakness (generalized): Secondary | ICD-10-CM | POA: Diagnosis not present

## 2016-09-15 DIAGNOSIS — R2681 Unsteadiness on feet: Secondary | ICD-10-CM | POA: Diagnosis not present

## 2016-09-17 ENCOUNTER — Encounter: Payer: Self-pay | Admitting: Cardiology

## 2016-09-17 DIAGNOSIS — R2681 Unsteadiness on feet: Secondary | ICD-10-CM | POA: Diagnosis not present

## 2016-09-17 DIAGNOSIS — M6281 Muscle weakness (generalized): Secondary | ICD-10-CM | POA: Diagnosis not present

## 2016-09-20 DIAGNOSIS — R2681 Unsteadiness on feet: Secondary | ICD-10-CM | POA: Diagnosis not present

## 2016-09-20 DIAGNOSIS — M6281 Muscle weakness (generalized): Secondary | ICD-10-CM | POA: Diagnosis not present

## 2016-09-21 DIAGNOSIS — M6281 Muscle weakness (generalized): Secondary | ICD-10-CM | POA: Diagnosis not present

## 2016-09-21 DIAGNOSIS — R2681 Unsteadiness on feet: Secondary | ICD-10-CM | POA: Diagnosis not present

## 2016-09-22 DIAGNOSIS — R2681 Unsteadiness on feet: Secondary | ICD-10-CM | POA: Diagnosis not present

## 2016-09-22 DIAGNOSIS — M6281 Muscle weakness (generalized): Secondary | ICD-10-CM | POA: Diagnosis not present

## 2016-09-24 ENCOUNTER — Telehealth: Payer: Self-pay | Admitting: *Deleted

## 2016-09-24 DIAGNOSIS — M6281 Muscle weakness (generalized): Secondary | ICD-10-CM | POA: Diagnosis not present

## 2016-09-24 DIAGNOSIS — R2681 Unsteadiness on feet: Secondary | ICD-10-CM | POA: Diagnosis not present

## 2016-09-24 NOTE — Telephone Encounter (Signed)
Niece calling for help sending transmission. Carelink monitor giving battery indicator- no new batteries available. Niece will call back when she gets new batteries for the monitor.

## 2016-09-27 DIAGNOSIS — R2681 Unsteadiness on feet: Secondary | ICD-10-CM | POA: Diagnosis not present

## 2016-09-27 DIAGNOSIS — M6281 Muscle weakness (generalized): Secondary | ICD-10-CM | POA: Diagnosis not present

## 2016-09-28 DIAGNOSIS — M6281 Muscle weakness (generalized): Secondary | ICD-10-CM | POA: Diagnosis not present

## 2016-09-28 DIAGNOSIS — R2681 Unsteadiness on feet: Secondary | ICD-10-CM | POA: Diagnosis not present

## 2016-09-29 DIAGNOSIS — M6281 Muscle weakness (generalized): Secondary | ICD-10-CM | POA: Diagnosis not present

## 2016-09-29 DIAGNOSIS — R2681 Unsteadiness on feet: Secondary | ICD-10-CM | POA: Diagnosis not present

## 2016-09-30 ENCOUNTER — Telehealth: Payer: Self-pay | Admitting: *Deleted

## 2016-09-30 NOTE — Telephone Encounter (Signed)
Kim [facility]) called to say that she had transmitted using patient's home monitor.  Transmission successfully received.  Luanna Cole that the transmission will be reviewed and if there are any abnormalities upon review, we will call her back.  Kim verbalized understanding and appreciation.

## 2016-09-30 NOTE — Telephone Encounter (Signed)
Informed Kim that patient's device is nearing ERI. I explained device behavior at ERI. Next remote day given. Kim verbalized understanding.

## 2016-09-30 NOTE — Telephone Encounter (Signed)
LMTCB//sss 

## 2016-10-01 DIAGNOSIS — M6281 Muscle weakness (generalized): Secondary | ICD-10-CM | POA: Diagnosis not present

## 2016-10-01 DIAGNOSIS — R2681 Unsteadiness on feet: Secondary | ICD-10-CM | POA: Diagnosis not present

## 2016-10-06 DIAGNOSIS — R2681 Unsteadiness on feet: Secondary | ICD-10-CM | POA: Diagnosis not present

## 2016-10-06 DIAGNOSIS — M6281 Muscle weakness (generalized): Secondary | ICD-10-CM | POA: Diagnosis not present

## 2016-10-08 DIAGNOSIS — M6281 Muscle weakness (generalized): Secondary | ICD-10-CM | POA: Diagnosis not present

## 2016-10-08 DIAGNOSIS — R2681 Unsteadiness on feet: Secondary | ICD-10-CM | POA: Diagnosis not present

## 2016-10-11 DIAGNOSIS — R2681 Unsteadiness on feet: Secondary | ICD-10-CM | POA: Diagnosis not present

## 2016-10-11 DIAGNOSIS — M6281 Muscle weakness (generalized): Secondary | ICD-10-CM | POA: Diagnosis not present

## 2016-10-13 DIAGNOSIS — R2681 Unsteadiness on feet: Secondary | ICD-10-CM | POA: Diagnosis not present

## 2016-10-13 DIAGNOSIS — M6281 Muscle weakness (generalized): Secondary | ICD-10-CM | POA: Diagnosis not present

## 2016-10-15 DIAGNOSIS — R2681 Unsteadiness on feet: Secondary | ICD-10-CM | POA: Diagnosis not present

## 2016-10-15 DIAGNOSIS — M6281 Muscle weakness (generalized): Secondary | ICD-10-CM | POA: Diagnosis not present

## 2016-10-18 DIAGNOSIS — R2681 Unsteadiness on feet: Secondary | ICD-10-CM | POA: Diagnosis not present

## 2016-10-18 DIAGNOSIS — M6281 Muscle weakness (generalized): Secondary | ICD-10-CM | POA: Diagnosis not present

## 2016-10-19 ENCOUNTER — Other Ambulatory Visit (INDEPENDENT_AMBULATORY_CARE_PROVIDER_SITE_OTHER): Payer: Medicare Other

## 2016-10-19 ENCOUNTER — Ambulatory Visit (INDEPENDENT_AMBULATORY_CARE_PROVIDER_SITE_OTHER)
Admission: RE | Admit: 2016-10-19 | Discharge: 2016-10-19 | Disposition: A | Discharge status: Home / Self Care | Payer: Medicare Other | Source: Ambulatory Visit | Attending: Internal Medicine | Admitting: Internal Medicine

## 2016-10-19 ENCOUNTER — Ambulatory Visit (INDEPENDENT_AMBULATORY_CARE_PROVIDER_SITE_OTHER): Payer: Medicare Other | Admitting: Internal Medicine

## 2016-10-19 ENCOUNTER — Encounter: Payer: Self-pay | Admitting: Internal Medicine

## 2016-10-19 ENCOUNTER — Telehealth: Payer: Self-pay

## 2016-10-19 ENCOUNTER — Other Ambulatory Visit: Payer: Self-pay | Admitting: Internal Medicine

## 2016-10-19 VITALS — BP 120/84 | HR 77 | Ht 64.0 in | Wt 102.0 lb

## 2016-10-19 DIAGNOSIS — I499 Cardiac arrhythmia, unspecified: Secondary | ICD-10-CM | POA: Diagnosis not present

## 2016-10-19 DIAGNOSIS — H9193 Unspecified hearing loss, bilateral: Secondary | ICD-10-CM | POA: Diagnosis not present

## 2016-10-19 DIAGNOSIS — J90 Pleural effusion, not elsewhere classified: Secondary | ICD-10-CM

## 2016-10-19 DIAGNOSIS — R631 Polydipsia: Secondary | ICD-10-CM

## 2016-10-19 DIAGNOSIS — H6121 Impacted cerumen, right ear: Secondary | ICD-10-CM | POA: Diagnosis not present

## 2016-10-19 DIAGNOSIS — Z23 Encounter for immunization: Secondary | ICD-10-CM | POA: Diagnosis not present

## 2016-10-19 DIAGNOSIS — S329XXD Fracture of unspecified parts of lumbosacral spine and pelvis, subsequent encounter for fracture with routine healing: Secondary | ICD-10-CM | POA: Diagnosis not present

## 2016-10-19 DIAGNOSIS — Z0001 Encounter for general adult medical examination with abnormal findings: Secondary | ICD-10-CM | POA: Diagnosis not present

## 2016-10-19 DIAGNOSIS — H9190 Unspecified hearing loss, unspecified ear: Secondary | ICD-10-CM | POA: Insufficient documentation

## 2016-10-19 DIAGNOSIS — S329XXA Fracture of unspecified parts of lumbosacral spine and pelvis, initial encounter for closed fracture: Secondary | ICD-10-CM | POA: Insufficient documentation

## 2016-10-19 LAB — URINALYSIS, ROUTINE W REFLEX MICROSCOPIC
Bilirubin Urine: NEGATIVE
KETONES UR: NEGATIVE
Nitrite: NEGATIVE
SPECIFIC GRAVITY, URINE: 1.01 (ref 1.000–1.030)
TOTAL PROTEIN, URINE-UPE24: NEGATIVE
URINE GLUCOSE: NEGATIVE
UROBILINOGEN UA: 0.2 (ref 0.0–1.0)
pH: 6 (ref 5.0–8.0)

## 2016-10-19 LAB — BASIC METABOLIC PANEL
BUN: 33 mg/dL — AB (ref 6–23)
CHLORIDE: 105 meq/L (ref 96–112)
CO2: 32 meq/L (ref 19–32)
CREATININE: 1.09 mg/dL (ref 0.40–1.20)
Calcium: 10.7 mg/dL — ABNORMAL HIGH (ref 8.4–10.5)
GFR: 50.17 mL/min — ABNORMAL LOW (ref >60.00)
GLUCOSE: 94 mg/dL (ref 70–99)
Potassium: 3.5 mEq/L (ref 3.5–5.1)
Sodium: 142 mEq/L (ref 135–145)

## 2016-10-19 LAB — LIPID PANEL
Cholesterol: 200 mg/dL (ref 0–200)
HDL: 56.6 mg/dL (ref >39.00)
LDL Cholesterol: 126 mg/dL — ABNORMAL HIGH (ref 0–99)
NONHDL: 143.2
TRIGLYCERIDES: 84 mg/dL (ref 0.0–149.0)
Total CHOL/HDL Ratio: 4
VLDL: 16.8 mg/dL (ref 0.0–40.0)

## 2016-10-19 LAB — HEPATIC FUNCTION PANEL
ALBUMIN: 4.3 g/dL (ref 3.5–5.2)
ALT: 8 U/L (ref 0–35)
AST: 21 U/L (ref 0–37)
Alkaline Phosphatase: 58 U/L (ref 39–117)
Bilirubin, Direct: 0.2 mg/dL (ref 0.0–0.3)
TOTAL PROTEIN: 7.6 g/dL (ref 6.0–8.3)
Total Bilirubin: 0.7 mg/dL (ref 0.2–1.2)

## 2016-10-19 LAB — CBC WITH DIFFERENTIAL/PLATELET
BASOS ABS: 0.1 10*3/uL (ref 0.0–0.1)
Basophils Relative: 0.9 % (ref 0.0–3.0)
EOS ABS: 0.3 10*3/uL (ref 0.0–0.7)
Eosinophils Relative: 4.6 % (ref 0.0–5.0)
HEMATOCRIT: 42 % (ref 36.0–46.0)
Hemoglobin: 13.9 g/dL (ref 12.0–15.0)
LYMPHS PCT: 26 % (ref 12.0–46.0)
Lymphs Abs: 1.8 10*3/uL (ref 0.7–4.0)
MCHC: 33 g/dL (ref 30.0–36.0)
MCV: 98.6 fl (ref 78.0–100.0)
Monocytes Absolute: 0.6 10*3/uL (ref 0.1–1.0)
Monocytes Relative: 8.5 % (ref 3.0–12.0)
NEUTROS ABS: 4.2 10*3/uL (ref 1.4–7.7)
Neutrophils Relative %: 60 % (ref 43.0–77.0)
Platelets: 239 10*3/uL (ref 150.0–400.0)
RBC: 4.26 Mil/uL (ref 3.87–5.11)
RDW: 14.2 % (ref 11.5–15.5)
WBC: 7 10*3/uL (ref 4.0–10.5)

## 2016-10-19 LAB — TSH: TSH: 2.2 u[IU]/mL (ref 0.35–4.50)

## 2016-10-19 MED ORDER — NITROFURANTOIN MACROCRYSTAL 50 MG PO CAPS: 50.0000 mg | ORAL_CAPSULE | Freq: Two times a day (BID) | ORAL | 0 refills | Status: DC

## 2016-10-19 NOTE — Telephone Encounter (Signed)
-----   Message from Corwin Levins, MD sent at 10/19/2016  5:54 PM EDT ----- Left message on MyChart, pt to cont same tx except   The test results show that your current treatment is OK, except the urine testing seems consistent with  Infection.  We will send a prescription to your pharmacy, and you should be called from the office as well.    Shekera Beavers to please inform pt, I will do rx

## 2016-10-19 NOTE — Progress Notes (Signed)
Subjective:    Patient ID: Alexis Cobb, female    DOB: 03/28/1925, 83 y.o.   MRN: [id number]  HPI  83 y.o.femalewith medical history significant of mitral valve repair, pacemaker placement, HTN, and HLD, hospd with acute resp failure hypoxia with right pleural effusion requiring thoracentesis, found to be transudate and transitioned to po lasix. Previous diarrhea resolved with hospn.  S/p transfusion for ABL anemia after multple pelvic fx with bleeding.  Was d/c to SNF - [facility], stayed for approx 1 mo, tx for UTI while there, and since then has been assisted living only [facility], there now for about 4 mo, also had PT there. Has graduated from wheelchair to walker, working towards a possible cane use only.  No repeat falls, weakness overall improved but still feels a bit weak.  Main c/o today is of signifciant thirst,  just cant seem to get enough fluids and worried she may have DM or med related.  Has had some urinary freq but thinks this is due to increased fluids. Has not taken lasix very much in last few months to explain polydipsia or polyuria.  No signficant worsening psychiatric difficulties to explain this as well. Has chronic stable RLE edema.   May be due for PPM generator replacement soon.     Pt denies Chest pain, worsening SOB, DOE, wheezing, orthopnea, PND, worsening LE edema, palpitations, dizziness or syncope.  Pt denies neurological change such as new headache, facial or extremity weakness. Pt states overall good compliance with treatment and medications, good tolerability, and has been trying to follow appropriate diet.  Pt denies worsening depressive symptoms, suicidal ideation or panic. No fever, night sweats, loss of appetite, or other constitutional symptoms.  Pt states fair ability with ADL's, has low to mod fall risk, home safety reviewed and adequate, and not active with exercise for reasons above.  Has noticed some hearing loss mild worsening in the past yr,  asks for audiolog referral but also right quite poor in the past wk - ? Wax.  And vision has been assoc with recurring double vision, but negative exam for acute as last optho visit in Jan 2018.  Past Medical History:  Diagnosis Date  . Arthritis   . ARTHRITIS, RHEUMATOID, HX OF 01/06/2007  . Cervical stenosis of spine   . HTN (hypertension)   . Hyperlipidemia   . MITRAL REGURGITATION 01/06/2007  . OSTEOARTHRITIS 01/14/2007  . OSTEOPOROSIS 01/14/2007  . Other specified forms of hearing loss 08/01/2009  . PACEMAKER, PERMANENT 01/06/2007  . Raynaud's syndrome 01/06/2007   Past Surgical History:  Procedure Laterality Date  . ABDOMINAL HYSTERECTOMY    . APPENDECTOMY    . CATARACT EXTRACTION, BILATERAL    . MITRAL VALVE REPAIR    . ovarian tumor, benign    . PACEMAKER INSERTION      reports that she has never smoked. She has never used smokeless tobacco. She reports that she does not drink alcohol or use drugs. family history is not on file. Allergies  Allergen Reactions  . Ace Inhibitors Rash  . Cephalexin Rash  . Penicillins Rash and Other (See Comments)    Has patient had a PCN reaction causing immediate rash, facial/tongue/throat swelling, SOB or lightheadedness with hypotension: No Has patient had a PCN reaction causing severe rash involving mucus membranes or skin necrosis: No Has patient had a PCN reaction that required hospitalization No Has patient had a PCN reaction occurring within the last 10 years: No If all  of the above answers are "NO", then may proceed with Cephalosporin use.  Marland Kitchen Risedronate Sodium Rash   Current Outpatient Prescriptions on File Prior to Visit  Medication Sig Dispense Refill  . aspirin EC 81 MG tablet Take 81 mg by mouth at bedtime.    . Calcium Carbonate-Vitamin D (CALCIUM 600+D) 600-400 MG-UNIT tablet Take 1 tablet by mouth 2 (two) times daily.    . cholecalciferol (VITAMIN D) 1000 units tablet Take 1,000 Units by mouth daily.    . furosemide (LASIX) 20  MG tablet Take 20 mg by mouth daily as needed for edema.    Marland Kitchen losartan (COZAAR) 100 MG tablet Take 100 mg by mouth daily.    Marland Kitchen lovastatin (MEVACOR) 40 MG tablet TAKE 1 TABLET EACH DAY. 30 tablet 0  . metoprolol succinate (TOPROL-XL) 50 MG 24 hr tablet Take 75 mg by mouth daily. Take with or immediately following a meal.    . Multiple Vitamin (MULTIVITAMIN WITH MINERALS) TABS tablet Take 1 tablet by mouth daily.    . nitroGLYCERIN (NITROSTAT) 0.4 MG SL tablet Place 0.4 mg under the tongue every 5 (five) minutes as needed for chest pain.     No current facility-administered medications on file prior to visit.    Review of Systems Constitutional: Negative for other unusual diaphoresis, sweats, appetite or weight changes HENT: Negative for other worsening hearing loss, ear pain, facial swelling, mouth sores or neck stiffness.   Eyes: Negative for other worsening pain, redness or other visual disturbance.  Respiratory: Negative for other stridor or swelling Cardiovascular: Negative for other palpitations or other chest pain  Gastrointestinal: Negative for worsening diarrhea or loose stools, blood in stool, distention or other pain Genitourinary: Negative for hematuria, flank pain or other change in urine volume.  Musculoskeletal: Negative for myalgias or other joint swelling.  Skin: Negative for other color change, or other wound or worsening drainage.  Neurological: Negative for other syncope or numbness. Hematological: Negative for other adenopathy or swelling Psychiatric/Behavioral: Negative for hallucinations, other worsening agitation, SI, self-injury, or new decreased concentration All other system neg per pt    Objective:   Physical Exam BP 120/84   Pulse 77   Ht 5\' 4"  (1.626 m)   Wt 102 lb (46.3 kg)   SpO2 97%   BMI 17.51 kg/m  VS noted, thin for height, has lost some wt, frail but able to stand with pushing off and walk with walker Constitutional: Pt is oriented to person,  place, and time. Appears well-developed and well-nourished, in no significant distress and comfortable Head: Normocephalic and atraumatic  Eyes: Conjunctivae and EOM are normal. Pupils are equal, round, and reactive to light Right Ear: External ear normal without discharge Right wax impaction resolved with irrigation Left Ear: External ear normal without discharge Nose: Nose without discharge or deformity Mouth/Throat: Oropharynx is without other ulcerations and moist  Neck: Normal range of motion. Neck supple. No JVD present. No tracheal deviation present or significant neck LA or mass Cardiovascular: Normal rate, regular rhythm, normal heart sounds and intact distal pulses.   Pulmonary/Chest: WOB normal and breath sounds without rales or wheezing but somewhat decreased right lower lung field Abdominal: Soft. Bowel sounds are normal. NT. No HSM  Musculoskeletal: Normal range of motion. Exhibits no edema Lymphadenopathy: Has no other cervical adenopathy.  Neurological: Pt is alert and oriented to person, place, and time. Pt has normal reflexes. No cranial nerve deficit. Motor grossly intact, Gait intact Skin: Skin is warm and dry. No  rash noted or new ulcerations Psychiatric:  Has mild nervous mood and affect. Behavior is normal without agitation No other exam findings  Lab Results  Component Value Date   WBC 10.2 05/07/2016   HGB 10.7 (L) 05/07/2016   HCT 32.1 (L) 05/07/2016   PLT 246 05/07/2016   GLUCOSE 97 05/07/2016   CHOL 137 12/12/2013   TRIG 138.0 12/12/2013   HDL 47.80 12/12/2013   LDLCALC 62 12/12/2013   ALT 18 05/05/2016   AST 37 05/05/2016   NA 141 05/07/2016   K 3.3 (L) 05/07/2016   CL 102 05/07/2016   CREATININE 0.69 05/07/2016   BUN 18 05/07/2016   CO2 32 05/07/2016   TSH 1.094 05/06/2016   INR 1.1 ratio (H) 08/29/2008    CHEST 1 VIEW 05/05/2016 - summary IMPRESSION: 1. No pneumothorax or evidence of a complication following  right Thoracentesis.  Transthoracic Echocardiography 05/06/2016 - summary Study Conclusions - Left ventricle: The cavity size was normal. Wall thickness was   increased in a pattern of mild LVH. Systolic function was normal.   The estimated ejection fraction was in the range of 60% to 65%.   Wall motion was normal; there were no regional wall motion   abnormalities. - Right ventricle: Pacer wire or catheter noted in right ventricle. - Right atrium: The atrium was mildly to moderately dilated. - Tricuspid valve: There was mild-moderate regurgitation. - Pulmonary arteries: Systolic pressure was moderately increased.   PA peak pressure: 51 mm Hg (S). - Pericardium, extracardiac: There was a left pleural effusion.     Assessment & Plan:

## 2016-10-19 NOTE — Telephone Encounter (Signed)
Pt has been informed and expressed understanding.  

## 2016-10-19 NOTE — Patient Instructions (Addendum)
You had the Prevnar pneumonia shot today  Your right ear was irrigated of wax  Please continue all other medications as before, and refills have been done if requested.  Please have the pharmacy call with any other refills you may need.  Please continue your efforts at being more active, low cholesterol diet, and weight control.  You are otherwise up to date with prevention measures today.  Please schedule the bone density test before leaving today at the scheduling desk (where you check out)  Please keep your appointments with your specialists as you may have planned  You will be contacted regarding the referral for: Endocrinology, and Audiology (hearing)  Please go to the XRAY Department in the Basement (go straight as you get off the elevator) for the x-ray testing  Please go to the LAB in the Basement (turn left off the elevator) for the tests to be done today  You will be contacted by phone if any changes need to be made immediately.  Otherwise, you will receive a letter about your results with an explanation, but please check with MyChart first.  Please remember to sign up for MyChart if you have not done so, as this will be important to you in the future with finding out test results, communicating by private email, and scheduling acute appointments online when needed.  Please return in 6 months, or sooner if needed

## 2016-10-20 DIAGNOSIS — R2681 Unsteadiness on feet: Secondary | ICD-10-CM | POA: Diagnosis not present

## 2016-10-20 DIAGNOSIS — M6281 Muscle weakness (generalized): Secondary | ICD-10-CM | POA: Diagnosis not present

## 2016-10-20 LAB — OSMOLALITY, URINE: OSMOLALITY UR: 400 mosm/kg (ref 50–1200)

## 2016-10-20 LAB — OSMOLALITY: OSMOLALITY: 303 mosm/kg (ref 278–305)

## 2016-10-20 LAB — SODIUM, URINE, RANDOM: Sodium, Ur: 116 mmol/L (ref 28–272)

## 2016-10-20 LAB — POTASSIUM, URINE, RANDOM: Potassium Urine: 22 mmol/L (ref 12–129)

## 2016-10-20 NOTE — Assessment & Plan Note (Signed)
Possible osteoporosis, will try to have DXA as she feels she can ascend the exam table to get this done

## 2016-10-20 NOTE — Assessment & Plan Note (Signed)

## 2016-10-20 NOTE — Assessment & Plan Note (Signed)
With some decreased BS on right lower lung field, for cxr f/u today

## 2016-10-20 NOTE — Assessment & Plan Note (Signed)
Right improved with irrigation, but still has at least mild worsening bilat hearing recently, so for audiology referral

## 2016-10-20 NOTE — Assessment & Plan Note (Addendum)
?   Significance, but cant r/o diabetes insipidus, for urine studies, serum osm, and refer endo  In addition to the time spent performing CPE, I spent an additional 25 minutes face to face,in which greater than 50% of this time was spent in counseling and coordination of care for patient's acute illness as documented, including the differential diagnosis, evaluation and tx of excessive thirst, double vision, hearing loss and pelvic fx

## 2016-10-22 ENCOUNTER — Encounter: Payer: Self-pay | Admitting: Endocrinology

## 2016-10-22 DIAGNOSIS — M6281 Muscle weakness (generalized): Secondary | ICD-10-CM | POA: Diagnosis not present

## 2016-10-22 DIAGNOSIS — R2681 Unsteadiness on feet: Secondary | ICD-10-CM | POA: Diagnosis not present

## 2016-10-25 DIAGNOSIS — R2681 Unsteadiness on feet: Secondary | ICD-10-CM | POA: Diagnosis not present

## 2016-10-25 DIAGNOSIS — M6281 Muscle weakness (generalized): Secondary | ICD-10-CM | POA: Diagnosis not present

## 2016-10-25 MED ORDER — NITROFURANTOIN MACROCRYSTAL 50 MG PO CAPS: 50.0000 mg | ORAL_CAPSULE | Freq: Two times a day (BID) | ORAL | 0 refills | Status: DC

## 2016-10-25 NOTE — Telephone Encounter (Signed)
Pt called stating that she is currently living at [facility] [place]. This medication was sent to [facility] and when her niece went to pick it up for her, they would not let her get it. The pt is requesting it to be sent to [facility] so that she can get started on it. (Phone # [phone]     Fax # [phone])

## 2016-10-25 NOTE — Addendum Note (Signed)
Addended by: Deatra James on: 10/25/2016 03:41 PM   Modules accepted: Orders

## 2016-10-25 NOTE — Telephone Encounter (Signed)
Called [place] to verify which pharmacy they use per nurse they use [facility] @ [phone]. Sent the antibiotic electronically to [facility]...Raechel Chute

## 2016-10-27 DIAGNOSIS — M6281 Muscle weakness (generalized): Secondary | ICD-10-CM | POA: Diagnosis not present

## 2016-10-27 DIAGNOSIS — R2681 Unsteadiness on feet: Secondary | ICD-10-CM | POA: Diagnosis not present

## 2016-10-28 DIAGNOSIS — M6281 Muscle weakness (generalized): Secondary | ICD-10-CM | POA: Diagnosis not present

## 2016-10-28 DIAGNOSIS — R2681 Unsteadiness on feet: Secondary | ICD-10-CM | POA: Diagnosis not present

## 2016-11-01 ENCOUNTER — Telehealth: Payer: Self-pay | Admitting: Cardiology

## 2016-11-01 ENCOUNTER — Ambulatory Visit (INDEPENDENT_AMBULATORY_CARE_PROVIDER_SITE_OTHER): Payer: Self-pay | Admitting: *Deleted

## 2016-11-01 DIAGNOSIS — Z950 Presence of cardiac pacemaker: Secondary | ICD-10-CM

## 2016-11-01 NOTE — Progress Notes (Signed)
Remote pacemaker transmission.   

## 2016-11-01 NOTE — Telephone Encounter (Signed)
Attempted to confirm remote transmission with pt. No answer and was unable to leave a message.   

## 2016-11-03 ENCOUNTER — Encounter: Payer: Self-pay | Admitting: Cardiology

## 2016-11-03 LAB — CUP PACEART REMOTE DEVICE CHECK
Battery Impedance: 4985 Ohm
Battery Remaining Longevity: 1 mo — CL
Battery Voltage: 2.64 V
Brady Statistic AP VS Percent: 0 %
Implantable Lead Implant Date: 20020104
Implantable Lead Implant Date: 20020104
Implantable Lead Location: 753859
Implantable Lead Location: 753860
Implantable Lead Model: 5076
Implantable Pulse Generator Implant Date: 20100429
Lead Channel Impedance Value: 389 Ohm
Lead Channel Pacing Threshold Pulse Width: 0.4 ms
Lead Channel Pacing Threshold Pulse Width: 0.4 ms
Lead Channel Setting Pacing Amplitude: 2 V
Lead Channel Setting Pacing Amplitude: 3.5 V
Lead Channel Setting Pacing Pulse Width: 0.4 ms
MDC IDC MSMT LEADCHNL RA PACING THRESHOLD AMPLITUDE: 0.625 V
MDC IDC MSMT LEADCHNL RV IMPEDANCE VALUE: 399 Ohm
MDC IDC MSMT LEADCHNL RV PACING THRESHOLD AMPLITUDE: 1.75 V
MDC IDC SESS DTM: 20180625161351
MDC IDC SET LEADCHNL RV SENSING SENSITIVITY: 2 mV
MDC IDC STAT BRADY AP VP PERCENT: 58 %
MDC IDC STAT BRADY AS VP PERCENT: 42 %
MDC IDC STAT BRADY AS VS PERCENT: 0 %

## 2016-11-05 ENCOUNTER — Encounter: Payer: Self-pay | Admitting: Internal Medicine

## 2016-11-15 ENCOUNTER — Ambulatory Visit (INDEPENDENT_AMBULATORY_CARE_PROVIDER_SITE_OTHER): Payer: Medicare Other | Admitting: Physician Assistant

## 2016-11-15 ENCOUNTER — Encounter: Payer: Self-pay | Admitting: Physician Assistant

## 2016-11-15 VITALS — BP 135/75 | HR 87 | Ht 64.0 in | Wt 101.4 lb

## 2016-11-15 DIAGNOSIS — I10 Essential (primary) hypertension: Secondary | ICD-10-CM

## 2016-11-15 DIAGNOSIS — Z9889 Other specified postprocedural states: Secondary | ICD-10-CM | POA: Diagnosis not present

## 2016-11-15 DIAGNOSIS — I495 Sick sinus syndrome: Secondary | ICD-10-CM | POA: Diagnosis not present

## 2016-11-15 DIAGNOSIS — I2510 Atherosclerotic heart disease of native coronary artery without angina pectoris: Secondary | ICD-10-CM

## 2016-11-15 NOTE — Progress Notes (Signed)
Cardiology Office Note    Date:  11/15/2016   ID:  Alexis Cobb, DOB Feb 13, 1925, MRN [id number]  PCP:  Corwin Levins, MD  Cardiologist:  Dr. Jens Som Primary electrophysiologist: Dr. Ladona Ridgel   Chief Complaint  Patient presents with  . Follow-up    seen for Dr. Jens Som.    History of Present Illness:  Alexis Cobb is a 83 y.o. female with PMH of MR s/p MVR, complete heart block s/p PPM and nonobstructive CAD by cath in 2003. She had a Myoview on 01/16/2008, her EF was 78%, no ischemia or infarction. Her echocardiogram in January 2014 showed normal LV function, previous mitral valve repair with trivial mitral regurgitation, mild right ventricular enlargement. She was last admitted in December 2017 after she slipped and fell onto the floor and was unable to get up. She had watery stools and diarrhea for 2 days prior to admission. She was tested positive for parainfluenza 1 virus. CT of abdomen and pelvis revealed multiple pelvic fractures with scattered pelvic hemorrhages and hematomas. Surgery was consulted, she was managed conservatively. She was given levofloxacin for presumptive aspiration pneumonia as well. During the admission, she required thoracentesis on 05/05/2016 with 0.28 L of transudate removed. She was placed on oral PRN lasix. Last echocardiogram obtained on 05/06/2016 showed EF 60-65%, mild to moderately dilated right atrium, mild-to-moderate TR, PA peak pressure 51 mmHg, left pleural effusion. Recent device interrogation shows that her device is close to ERI. She is having monthly interrogation to check on battery life. Her upcoming device interrogation is 11/29/2016.  She presents today for cardiology evaluation. She denies any significant chest discomfort. She has been living in [facility] independent living facility after she was released from rehabilitation. She has been recovering well for the past few months. She still walks around with a walker however felt  her breathing is getting better. She denies any significant pain in her pelvic area. Otherwise she denies any recent significant chest discomfort, lower extremity edema, orthopnea or paroxysmal nocturnal dyspnea. She is aware that her device is near ERI, she is doing well from general cardiology perspective, she can follow-up in one year. Dr. Ladona Ridgel likely would have replaced her device in the next month or so.   Past Medical History:  Diagnosis Date  . Arthritis   . ARTHRITIS, RHEUMATOID, HX OF 01/06/2007  . Cervical stenosis of spine   . HTN (hypertension)   . Hyperlipidemia   . MITRAL REGURGITATION 01/06/2007  . OSTEOARTHRITIS 01/14/2007  . OSTEOPOROSIS 01/14/2007  . Other specified forms of hearing loss 08/01/2009  . PACEMAKER, PERMANENT 01/06/2007  . Raynaud's syndrome 01/06/2007    Past Surgical History:  Procedure Laterality Date  . ABDOMINAL HYSTERECTOMY    . APPENDECTOMY    . CATARACT EXTRACTION, BILATERAL    . MITRAL VALVE REPAIR    . ovarian tumor, benign    . PACEMAKER INSERTION      Current Medications: Outpatient Medications Prior to Visit  Medication Sig Dispense Refill  . aspirin EC 81 MG tablet Take 81 mg by mouth at bedtime.    . Calcium Carbonate-Vitamin D (CALCIUM 600+D) 600-400 MG-UNIT tablet Take 1 tablet by mouth 2 (two) times daily.    . cholecalciferol (VITAMIN D) 1000 units tablet Take 1,000 Units by mouth daily.    . furosemide (LASIX) 20 MG tablet Take 20 mg by mouth daily as needed for edema.    Marland Kitchen losartan (COZAAR) 100 MG tablet Take 100 mg by mouth daily.    Marland Kitchen  lovastatin (MEVACOR) 40 MG tablet TAKE 1 TABLET EACH DAY. 30 tablet 0  . metoprolol succinate (TOPROL-XL) 50 MG 24 hr tablet Take 75 mg by mouth daily. Take with or immediately following a meal.    . Multiple Vitamin (MULTIVITAMIN WITH MINERALS) TABS tablet Take 1 tablet by mouth daily.    . nitrofurantoin (MACRODANTIN) 50 MG capsule Take 1 capsule (50 mg total) by mouth 2 (two) times daily. 14  capsule 0  . nitroGLYCERIN (NITROSTAT) 0.4 MG SL tablet Place 0.4 mg under the tongue every 5 (five) minutes as needed for chest pain.     No facility-administered medications prior to visit.      Allergies:   Ace inhibitors; Cephalexin; Penicillins; and Risedronate sodium   Social History   Social History  . Marital status: Widowed    Spouse name: N/A  . Number of children: N/A  . Years of education: N/A   Occupational History  . retired Catering manager Retired   Social History Main Topics  . Smoking status: Never Smoker  . Smokeless tobacco: Never Used  . Alcohol use No  . Drug use: No  . Sexual activity: Not Asked   Other Topics Concern  . None   Social History Narrative  . None     Family History:  The patient's  family history is not on file.   ROS:   Please see the history of present illness.    ROS All other systems reviewed and are negative.   PHYSICAL EXAM:   VS:  BP 135/75   Pulse 87   Ht 5\' 4"  (1.626 m)   Wt 101 lb 6.4 oz (46 kg)   BMI 17.41 kg/m    GEN: Well nourished, well developed, in no acute distress  HEENT: normal  Neck: no JVD, carotid bruits, or masses Cardiac: RRR; no murmurs, rubs, or gallops,no edema  Respiratory:  clear to auscultation bilaterally, normal work of breathing GI: soft, nontender, nondistended, + BS MS: no deformity or atrophy  Skin: warm and dry, no rash Neuro:  Alert and Oriented x 3, Strength and sensation are intact Psych: euthymic mood, full affect  Wt Readings from Last 3 Encounters:  11/15/16 101 lb 6.4 oz (46 kg)  10/19/16 102 lb (46.3 kg)  07/30/16 100 lb (45.4 kg)      Studies/Labs Reviewed:   EKG:  EKG is ordered today.  The ekg ordered today demonstrates AV paced rhythm  Recent Labs: 05/05/2016: Magnesium 1.8 05/06/2016: B Natriuretic Peptide 293.6 10/19/2016: ALT 8; BUN 33; Creatinine, Ser 1.09; Hemoglobin 13.9; Platelets 239.0; Potassium 3.5; Sodium 142; TSH 2.20   Lipid Panel    Component Value  Date/Time   CHOL 200 10/19/2016 1415   TRIG 84.0 10/19/2016 1415   HDL 56.60 10/19/2016 1415   CHOLHDL 4 10/19/2016 1415   VLDL 16.8 10/19/2016 1415   LDLCALC 126 (H) 10/19/2016 1415    Additional studies/ records that were reviewed today include:   Echo 05/06/2016 LV EF: 60% -   65%  Study Conclusions  - Left ventricle: The cavity size was normal. Wall thickness was   increased in a pattern of mild LVH. Systolic function was normal.   The estimated ejection fraction was in the range of 60% to 65%.   Wall motion was normal; there were no regional wall motion   abnormalities. - Right ventricle: Pacer wire or catheter noted in right ventricle. - Right atrium: The atrium was mildly to moderately dilated. - Tricuspid valve: There was  mild-moderate regurgitation. - Pulmonary arteries: Systolic pressure was moderately increased.   PA peak pressure: 51 mm Hg (S). - Pericardium, extracardiac: There was a left pleural effusion.   ASSESSMENT:    1. Coronary artery disease involving native coronary artery of native heart without angina pectoris   2. Essential hypertension   3. Sinoatrial node dysfunction (HCC)   4. S/P MVR (mitral valve repair)      PLAN:  In order of problems listed above:  1. CAD: Denies any recent chest discomfort. No obvious angina.  2. Hypertension: Blood pressure well controlled on Toprol-XL.  3. H/o CHB s/p PPM: Recent remote transmission indicated she is less than 1 month away from ERI, I have talked to device clinic staff who plan to get a remote transmission later this month. She likely will have device change out in the next month or 2.  4. H/o MVR: Did not mention on the most recent echocardiogram in December 2017, no evidence of stenosis and trivial regurgitation.    Medication Adjustments/Labs and Tests Ordered: Current medicines are reviewed at length with the patient today.  Concerns regarding medicines are outlined above.  Medication  changes, Labs and Tests ordered today are listed in the Patient Instructions below. Patient Instructions  Medication Instructions:   No changes  Labwork:   none  Testing/Procedures:  Remote pacemaker check at the end of this month  Follow-Up:  1 year with Dr. Jens Som  If you need a refill on your cardiac medications before your next appointment, please call your pharmacy.      Ramond Dial, [place]  11/15/2016 4:42 PM    [facility] [address] [place], [place], [place]  [address] Phone: [phone]; Fax: [phone]

## 2016-11-15 NOTE — Patient Instructions (Signed)
Medication Instructions:   No changes  Labwork:   none  Testing/Procedures:  Remote pacemaker check at the end of this month  Follow-Up:  1 year with Dr. Jens Som  If you need a refill on your cardiac medications before your next appointment, please call your pharmacy.

## 2016-11-17 ENCOUNTER — Encounter: Payer: Self-pay | Admitting: Cardiology

## 2016-12-02 ENCOUNTER — Telehealth: Payer: Self-pay | Admitting: Cardiology

## 2016-12-02 ENCOUNTER — Ambulatory Visit (INDEPENDENT_AMBULATORY_CARE_PROVIDER_SITE_OTHER): Payer: Self-pay | Admitting: *Deleted

## 2016-12-02 DIAGNOSIS — Z950 Presence of cardiac pacemaker: Secondary | ICD-10-CM

## 2016-12-02 NOTE — Telephone Encounter (Signed)
Attempted to confirm remote transmission with pt. No answer and was unable to leave a message.   

## 2016-12-03 ENCOUNTER — Encounter: Payer: Self-pay | Admitting: Cardiology

## 2016-12-03 NOTE — Progress Notes (Signed)
Remote pacemaker transmission.   

## 2016-12-28 ENCOUNTER — Telehealth: Payer: Self-pay | Admitting: Internal Medicine

## 2016-12-28 NOTE — Telephone Encounter (Signed)
12/02/16 transmission received- <1-5 months remaining. Next transmission scheduled for 01/03/17 to check battery status. Mrs. Raynes made aware and is appreciative.

## 2016-12-28 NOTE — Telephone Encounter (Signed)
New message  Pt call requesting to speak with RN about her device. Pt would like to know if she makes appt for nov would her battery last until then . Pt feels she needs to make a sooner apt. Please call back to discuss

## 2016-12-29 LAB — CUP PACEART REMOTE DEVICE CHECK
Battery Remaining Longevity: 1 mo — CL
Brady Statistic AP VS Percent: 0 %
Brady Statistic AS VP Percent: 38 %
Date Time Interrogation Session: 20180726155344
Implantable Lead Implant Date: 20020104
Implantable Lead Location: 753859
Implantable Lead Location: 753860
Lead Channel Pacing Threshold Amplitude: 0.625 V
Lead Channel Pacing Threshold Amplitude: 1.75 V
Lead Channel Pacing Threshold Pulse Width: 0.4 ms
Lead Channel Setting Pacing Amplitude: 2 V
MDC IDC LEAD IMPLANT DT: 20020104
MDC IDC MSMT BATTERY IMPEDANCE: 5413 Ohm
MDC IDC MSMT BATTERY VOLTAGE: 2.61 V
MDC IDC MSMT LEADCHNL RA IMPEDANCE VALUE: 400 Ohm
MDC IDC MSMT LEADCHNL RA PACING THRESHOLD PULSEWIDTH: 0.4 ms
MDC IDC MSMT LEADCHNL RV IMPEDANCE VALUE: 400 Ohm
MDC IDC PG IMPLANT DT: 20100429
MDC IDC SET LEADCHNL RV PACING AMPLITUDE: 3.5 V
MDC IDC SET LEADCHNL RV PACING PULSEWIDTH: 0.4 ms
MDC IDC SET LEADCHNL RV SENSING SENSITIVITY: 2 mV
MDC IDC STAT BRADY AP VP PERCENT: 62 %
MDC IDC STAT BRADY AS VS PERCENT: 0 %

## 2017-01-03 ENCOUNTER — Ambulatory Visit: Payer: Medicare Other | Admitting: *Deleted

## 2017-01-04 ENCOUNTER — Telehealth: Payer: Self-pay | Admitting: Cardiology

## 2017-01-04 ENCOUNTER — Ambulatory Visit (INDEPENDENT_AMBULATORY_CARE_PROVIDER_SITE_OTHER): Payer: Medicare Other | Admitting: *Deleted

## 2017-01-04 DIAGNOSIS — I495 Sick sinus syndrome: Secondary | ICD-10-CM

## 2017-01-04 NOTE — Telephone Encounter (Signed)
Attempted to call pt b/c her device has reached RRT on 12-26-16. No answer and unable to leave a message.

## 2017-01-05 NOTE — Telephone Encounter (Signed)
Informed patient that her device reached ERI on 12/26/16. I informed patient that she will need to follow up with Dr.Taylor to discuss gen change. Patient verbalized understanding.  Will defer scheduling to [facility].

## 2017-01-13 NOTE — Progress Notes (Signed)
Remote pacemaker transmission.   

## 2017-01-14 ENCOUNTER — Encounter: Payer: Self-pay | Admitting: Cardiology

## 2017-01-21 LAB — CUP PACEART REMOTE DEVICE CHECK
Implantable Lead Implant Date: 20020104
Implantable Lead Implant Date: 20020104
Implantable Lead Location: 753859
Implantable Lead Location: 753860
Implantable Pulse Generator Implant Date: 20100429
Lead Channel Impedance Value: 67 Ohm
Lead Channel Setting Pacing Amplitude: 3.5 V
Lead Channel Setting Sensing Sensitivity: 2 mV
MDC IDC MSMT BATTERY IMPEDANCE: 6340 Ohm
MDC IDC MSMT BATTERY VOLTAGE: 2.63 V
MDC IDC MSMT LEADCHNL RV IMPEDANCE VALUE: 374 Ohm
MDC IDC SESS DTM: 20180828200157
MDC IDC SET LEADCHNL RV PACING PULSEWIDTH: 0.4 ms
MDC IDC STAT BRADY RV PERCENT PACED: 100 %

## 2017-01-27 ENCOUNTER — Ambulatory Visit (INDEPENDENT_AMBULATORY_CARE_PROVIDER_SITE_OTHER): Payer: Medicare Other | Admitting: Internal Medicine

## 2017-01-27 ENCOUNTER — Encounter: Payer: Self-pay | Admitting: Internal Medicine

## 2017-01-27 VITALS — BP 122/78 | HR 76 | Ht 64.0 in | Wt 106.6 lb

## 2017-01-27 DIAGNOSIS — I495 Sick sinus syndrome: Secondary | ICD-10-CM | POA: Diagnosis not present

## 2017-01-27 DIAGNOSIS — Z950 Presence of cardiac pacemaker: Secondary | ICD-10-CM

## 2017-01-27 NOTE — Progress Notes (Signed)
    HPI [name] returns today for ongoing evaluation and management of her PPM as she has reached ERI. She is a pleasant 83 yo woman with a h/o CHB, s/p PPM insertion years ago. In the interim, she has fallen and broken her pelvis. She is now using a walker. She is living in assisted living. She uses a walker to ambulate. Allergies  Allergen Reactions  . Ace Inhibitors Rash  . Cephalexin Rash  . Penicillins Rash and Other (See Comments)    Has patient had a PCN reaction causing immediate rash, facial/tongue/throat swelling, SOB or lightheadedness with hypotension: No Has patient had a PCN reaction causing severe rash involving mucus membranes or skin necrosis: No Has patient had a PCN reaction that required hospitalization No Has patient had a PCN reaction occurring within the last 10 years: No If all of the above answers are "NO", then may proceed with Cephalosporin use.  . Risedronate Sodium Rash     Current Outpatient Prescriptions  Medication Sig Dispense Refill  . aspirin EC 81 MG tablet Take 81 mg by mouth at bedtime.    . Calcium Carbonate-Vitamin D (CALCIUM 600+D) 600-400 MG-UNIT tablet Take 1 tablet by mouth 2 (two) times daily.    . cholecalciferol (VITAMIN D) 1000 units tablet Take 1,000 Units by mouth daily.    . furosemide (LASIX) 20 MG tablet Take 20 mg by mouth daily as needed for edema.    . losartan (COZAAR) 100 MG tablet Take 100 mg by mouth daily.    . lovastatin (MEVACOR) 40 MG tablet TAKE 1 TABLET EACH DAY. 30 tablet 0  . metoprolol succinate (TOPROL-XL) 50 MG 24 hr tablet Take 75 mg by mouth daily. Take with or immediately following a meal.    . Multiple Vitamin (MULTIVITAMIN WITH MINERALS) TABS tablet Take 1 tablet by mouth daily.    . nitroGLYCERIN (NITROSTAT) 0.4 MG SL tablet Place 0.4 mg under the tongue every 5 (five) minutes as needed for chest pain.     No current facility-administered medications for this visit.      Past Medical History:    Diagnosis Date  . Arthritis   . ARTHRITIS, RHEUMATOID, HX OF 01/06/2007  . Cervical stenosis of spine   . HTN (hypertension)   . Hyperlipidemia   . MITRAL REGURGITATION 01/06/2007  . OSTEOARTHRITIS 01/14/2007  . OSTEOPOROSIS 01/14/2007  . Other specified forms of hearing loss 08/01/2009  . PACEMAKER, PERMANENT 01/06/2007  . Raynaud's syndrome 01/06/2007    ROS:   All systems reviewed and negative except as noted in the HPI.   Past Surgical History:  Procedure Laterality Date  . ABDOMINAL HYSTERECTOMY    . APPENDECTOMY    . CATARACT EXTRACTION, BILATERAL    . MITRAL VALVE REPAIR    . ovarian tumor, benign    . PACEMAKER INSERTION       Family History  Problem Relation Age of Onset  . Coronary artery disease Unknown   . Hypertension Unknown      Social History   Social History  . Marital status: Widowed    Spouse name: N/A  . Number of children: N/A  . Years of education: N/A   Occupational History  . retired bookkeeper Retired   Social History Main Topics  . Smoking status: Never Smoker  . Smokeless tobacco: Never Used  . Alcohol use No  . Drug use: No  . Sexual activity: Not on file   Other Topics Concern  . Not   on file   Social History Narrative  . No narrative on file     BP 122/78   Pulse 76   Ht 5\' 4"  (1.626 m)   Wt 106 lb 9.6 oz (48.4 kg)   SpO2 98%   BMI 18.30 kg/m   Physical Exam:  Chronically ill appearing 83 yo woman, NAD HEENT: Unremarkable Neck:  6 cm JVD, no thyromegally Lymphatics:  No adenopathy Back:  No CVA tenderness Lungs:  Clear with no wheezes HEART:  Regular rate rhythm, no murmurs, no rubs, no clicks Abd:  soft, positive bowel sounds, no organomegally, no rebound, no guarding Ext:  2 plus pulses, no edema, no cyanosis, no clubbing Skin:  No rashes no nodules Neuro:  CN II through XII intact, motor grossly intact  DEVICE  Normal device function.  See PaceArt for details.   Assess/Plan:  1. CHB - she is s/p PPM  insertion.  2. PPM - she has reached ERI. She will undergo PPM gen change as she has CHB. 3. Atrial fib - her rates are controlled. With her falls and pelvic fractures, she is no longer a good candidate for systemic anti-coagulation. 4. HTN - her blood pressure is well controlled. No change in meds.  92.D.

## 2017-01-27 NOTE — Patient Instructions (Addendum)
Medication Instructions:  Your physician recommends that you continue on your current medications as directed. Please refer to the Current Medication list given to you today.  Labwork: You will get lab work today:  BMP and CBC  Testing/Procedures: Your physician has recommended that you have a pacemaker inserted. A pacemaker is a small device that is placed under the skin of your chest or abdomen to help control abnormal heart rhythms. This device uses electrical pulses to prompt the heart to beat at a normal rate. Pacemakers are used to treat heart rhythms that are too slow. Wire (leads) are attached to the pacemaker that goes into the chambers of you heart. This is done in the hospital and usually requires and overnight stay. Please see the instruction sheet given to you today for more information.   Follow-Up: You will follow up in 10-14 days with the device clinic for a wound check.  You will follow up with Dr. Ladona Ridgel in 91 days.  We will send you a letter to schedule this appointment.  Any Other Special Instructions Will Be Listed Below (If Applicable).  Please arrive at the [facility] main entrance of [facility] hospital at:  7:30 am on 02/04/2017 Do not eat or drink after midnight prior to procedure Do not take any medications the morning of the procedure Use the CHG scrub as directed You will be able to go home with a driver after your procedure  If you need a refill on your cardiac medications before your next appointment, please call your pharmacy.

## 2017-01-28 LAB — BASIC METABOLIC PANEL
BUN/Creatinine Ratio: 38 — ABNORMAL HIGH (ref 12–28)
BUN: 47 mg/dL — ABNORMAL HIGH (ref 8–27)
CALCIUM: 10.3 mg/dL (ref 8.7–10.3)
CO2: 26 mmol/L (ref 20–29)
CREATININE: 1.25 mg/dL — AB (ref 0.57–1.00)
Chloride: 107 mmol/L — ABNORMAL HIGH (ref 96–106)
GFR calc Af Amer: 44 mL/min/{1.73_m2} — ABNORMAL LOW (ref >59)
GFR, EST NON AFRICAN AMERICAN: 38 mL/min/{1.73_m2} — AB (ref >59)
Glucose: 93 mg/dL (ref 65–99)
POTASSIUM: 4 mmol/L (ref 3.5–5.2)
Sodium: 149 mmol/L — ABNORMAL HIGH (ref 134–144)

## 2017-01-28 LAB — CBC
HEMATOCRIT: 40.1 % (ref 34.0–46.6)
HEMOGLOBIN: 13.3 g/dL (ref 11.1–15.9)
MCH: 32.8 pg (ref 26.6–33.0)
MCHC: 33.2 g/dL (ref 31.5–35.7)
MCV: 99 fL — ABNORMAL HIGH (ref 79–97)
Platelets: 236 10*3/uL (ref 150–379)
RBC: 4.05 x10E6/uL (ref 3.77–5.28)
RDW: 14.2 % (ref 12.3–15.4)
WBC: 6.7 10*3/uL (ref 3.4–10.8)

## 2017-02-02 ENCOUNTER — Ambulatory Visit (INDEPENDENT_AMBULATORY_CARE_PROVIDER_SITE_OTHER): Payer: Medicare Other | Admitting: Internal Medicine

## 2017-02-02 ENCOUNTER — Encounter: Payer: Self-pay | Admitting: Internal Medicine

## 2017-02-02 ENCOUNTER — Telehealth: Payer: Self-pay

## 2017-02-02 ENCOUNTER — Other Ambulatory Visit (INDEPENDENT_AMBULATORY_CARE_PROVIDER_SITE_OTHER): Payer: Medicare Other

## 2017-02-02 VITALS — BP 116/78 | HR 64 | Temp 98.2°F | Ht 64.0 in | Wt 107.0 lb

## 2017-02-02 DIAGNOSIS — R399 Unspecified symptoms and signs involving the genitourinary system: Secondary | ICD-10-CM

## 2017-02-02 DIAGNOSIS — R300 Dysuria: Secondary | ICD-10-CM

## 2017-02-02 DIAGNOSIS — I10 Essential (primary) hypertension: Secondary | ICD-10-CM | POA: Diagnosis not present

## 2017-02-02 LAB — URINALYSIS, ROUTINE W REFLEX MICROSCOPIC
BILIRUBIN URINE: NEGATIVE
Ketones, ur: NEGATIVE
NITRITE: NEGATIVE
PH: 6 (ref 5.0–8.0)
SPECIFIC GRAVITY, URINE: 1.01 (ref 1.000–1.030)
Urine Glucose: NEGATIVE
Urobilinogen, UA: 0.2 (ref 0.0–1.0)

## 2017-02-02 MED ORDER — CIPROFLOXACIN HCL 250 MG PO TABS: 250.0000 mg | ORAL_TABLET | Freq: Two times a day (BID) | ORAL | 0 refills | Status: DC

## 2017-02-02 NOTE — Telephone Encounter (Signed)
Labs entered for upcoming appointment.

## 2017-02-02 NOTE — Progress Notes (Signed)
Subjective:    Patient ID: Alexis Cobb, female    DOB: 1924/11/15, 83 y.o.   MRN: [id number]  HPI  Here to c/o feeling poor x 2-3 days, general weak, with lower abd pain, dysuria and freq as well as small BRB in urine this AM.  No falls.  Pt denies chest pain, increased sob or doe, wheezing, orthopnea, PND, increased LE swelling, palpitations, dizziness or syncope.  Pt denies new neurological symptoms such as new headache, or facial or extremity weakness or numbness   Pt denies polydipsia, polyuria,  Denies worsening reflux, abd pain, n/v, bowel change or blood, but does have mild dysphagia similar to several yrs ago before EGD with dilation. Wt Readings from Last 3 Encounters:  02/02/17 107 lb (48.5 kg)  01/27/17 106 lb 9.6 oz (48.4 kg)  11/15/16 101 lb 6.4 oz (46 kg)   Past Medical History:  Diagnosis Date  . Arthritis   . ARTHRITIS, RHEUMATOID, HX OF 01/06/2007  . Cervical stenosis of spine   . HTN (hypertension)   . Hyperlipidemia   . MITRAL REGURGITATION 01/06/2007  . OSTEOARTHRITIS 01/14/2007  . OSTEOPOROSIS 01/14/2007  . Other specified forms of hearing loss 08/01/2009  . PACEMAKER, PERMANENT 01/06/2007  . Raynaud's syndrome 01/06/2007   Past Surgical History:  Procedure Laterality Date  . ABDOMINAL HYSTERECTOMY    . APPENDECTOMY    . CATARACT EXTRACTION, BILATERAL    . MITRAL VALVE REPAIR    . ovarian tumor, benign    . PACEMAKER INSERTION    . PPM GENERATOR CHANGEOUT N/A 02/04/2017   Procedure: PPM GENERATOR CHANGEOUT;  Surgeon: Marinus Maw, MD;  Location: [facility] INVASIVE CV LAB;  Service: Cardiovascular;  Laterality: N/A;    reports that she has never smoked. She has never used smokeless tobacco. She reports that she does not drink alcohol or use drugs. family history includes Coronary artery disease in her unknown relative; Hypertension in her unknown relative. Allergies  Allergen Reactions  . Ace Inhibitors Rash  . Cephalexin Rash  . Penicillins Rash and Other  (See Comments)    Has patient had a PCN reaction causing immediate rash, facial/tongue/throat swelling, SOB or lightheadedness with hypotension: No Has patient had a PCN reaction causing severe rash involving mucus membranes or skin necrosis: No Has patient had a PCN reaction that required hospitalization No Has patient had a PCN reaction occurring within the last 10 years: No If all of the above answers are "NO", then may proceed with Cephalosporin use.  Marland Kitchen Risedronate Sodium Rash   Current Outpatient Prescriptions on File Prior to Visit  Medication Sig Dispense Refill  . aspirin EC 81 MG tablet Take 81 mg by mouth at bedtime.    . Calcium Carbonate-Vitamin D (CALCIUM 600+D) 600-400 MG-UNIT tablet Take 1 tablet by mouth 2 (two) times daily.    . cholecalciferol (VITAMIN D) 1000 units tablet Take 1,000 Units by mouth daily.    . furosemide (LASIX) 20 MG tablet Take 20 mg by mouth daily as needed for edema.    Marland Kitchen losartan (COZAAR) 100 MG tablet Take 100 mg by mouth.     . lovastatin (MEVACOR) 40 MG tablet TAKE 1 TABLET EACH DAY. (Patient taking differently: Take 40 mg by mouth daily) 30 tablet 0  . metoprolol succinate (TOPROL-XL) 50 MG 24 hr tablet Take 75 mg by mouth daily. Take with or immediately following a meal.    . Multiple Vitamin (MULTIVITAMIN WITH MINERALS) TABS tablet Take 1 tablet by mouth  daily.    . nitroGLYCERIN (NITROSTAT) 0.4 MG SL tablet Place 0.4 mg under the tongue every 5 (five) minutes as needed for chest pain.     No current facility-administered medications on file prior to visit.    Review of Systems All other system neg per pt    Objective:   Physical Exam BP 116/78   Pulse 64   Temp 98.2 F (36.8 C) (Oral)   Ht 5\' 4"  (1.626 m)   Wt 107 lb (48.5 kg)   SpO2 96%   BMI 18.37 kg/m  VS noted,  Constitutional: Pt appears in NAD HENT: Head: NCAT.  Right Ear: External ear normal.  Left Ear: External ear normal.  Eyes: . Pupils are equal, round, and reactive to  light. Conjunctivae and EOM are normal Nose: without d/c or deformity Neck: Neck supple. Gross normal ROM Cardiovascular: Normal rate and regular rhythm.   Pulmonary/Chest: Effort normal and breath sounds without rales or wheezing.  Abd:  Soft, ND, + BS, no organomegaly, with low mid abd tender without guarding, rebound or flank tenderness Neurological: Pt is alert. At baseline orientation, motor grossly intact Skin: Skin is warm. No rashes, other new lesions, no LE edema Psychiatric: Pt behavior is normal without agitation  No other exam findings  Urinalysis, Routine w reflex microscopic  Order:  Status:  Final result  Visible to patient:  No (Not Released)  Dx:  UTI symptoms    Ref Range & Units 14:10 69mo ago   Color, Urine Yellow;Lt. Yellow YELLOW  YELLOW    APPearance Clear CLEAR  CLEAR    Specific Gravity, Urine 1.000 - 1.030 1.010  1.010    pH 5.0 - 8.0 6.0  6.0    Total Protein, Urine Negative TRACE   NEGATIVE    Urine Glucose Negative NEGATIVE  NEGATIVE    Ketones, ur Negative NEGATIVE  NEGATIVE    Bilirubin Urine Negative NEGATIVE  NEGATIVE    Hgb urine dipstick Negative LARGE   MODERATE     Urobilinogen, UA 0.0 - 1.0 0.2  0.2    Leukocytes, UA Negative LARGE   LARGE     Nitrite Negative NEGATIVE  NEGATIVE    WBC, UA 0-2/hpf 11-20/hpf   11-20/hpf     RBC / HPF 0-2/hpf 11-20/hpf   7-10/hpf     Squamous Epithelial / LPF Rare(0-4/hpf) Rare(0-4/hpf)  Rare(0-4/hpf)    Bacteria, UA None Rare(<10/hpf)   Few(10-50/hpf)             Assessment & Plan:

## 2017-02-02 NOTE — Telephone Encounter (Signed)
Call received from [facility], Pt emergency contact.  Per Selena Batten Pt developed red tinged urine yesterday 02/01/2017 with low back pain.  Kim asking if Pt should still have procedure scheduled for Friday with Dr. Ladona Ridgel.  Spoke with APP, Pt should see PCP today and be evaluated.  Pt will be ok for surgery Friday as long as she does not develop fever or chills.  If Pt should develop fever or chills Kim should call office to cancel procedure. Kim indicates understanding.  This nurse requested Selena Batten call later today and update situation.  Will cont to monitor.

## 2017-02-02 NOTE — Patient Instructions (Signed)
Please take all new medication as prescribed - the antibiotic  We should have results of the culture in 2-3 days.  Please continue all other medications as before, and refills have been done if requested.  Please have the pharmacy call with any other refills you may need.  Please keep your appointments with your specialists as you may have planned

## 2017-02-03 ENCOUNTER — Ambulatory Visit: Payer: Medicare Other | Admitting: Internal Medicine

## 2017-02-04 ENCOUNTER — Ambulatory Visit (HOSPITAL_COMMUNITY)
Admission: RE | Disposition: A | Discharge status: Home / Self Care | Payer: Self-pay | Source: Ambulatory Visit | Attending: Internal Medicine

## 2017-02-04 ENCOUNTER — Encounter (HOSPITAL_COMMUNITY): Payer: Self-pay | Admitting: Internal Medicine

## 2017-02-04 ENCOUNTER — Ambulatory Visit (HOSPITAL_COMMUNITY)
Admission: RE | Admit: 2017-02-04 | Discharge: 2017-02-04 | Disposition: A | Discharge status: Home / Self Care | Payer: Medicare Other | Source: Ambulatory Visit | Attending: Internal Medicine | Admitting: Internal Medicine

## 2017-02-04 DIAGNOSIS — Z7982 Long term (current) use of aspirin: Secondary | ICD-10-CM | POA: Insufficient documentation

## 2017-02-04 DIAGNOSIS — I10 Essential (primary) hypertension: Secondary | ICD-10-CM | POA: Diagnosis not present

## 2017-02-04 DIAGNOSIS — I7300 Raynaud's syndrome without gangrene: Secondary | ICD-10-CM | POA: Insufficient documentation

## 2017-02-04 DIAGNOSIS — E785 Hyperlipidemia, unspecified: Secondary | ICD-10-CM | POA: Diagnosis not present

## 2017-02-04 DIAGNOSIS — M069 Rheumatoid arthritis, unspecified: Secondary | ICD-10-CM | POA: Diagnosis not present

## 2017-02-04 DIAGNOSIS — Z45010 Encounter for checking and testing of cardiac pacemaker pulse generator [battery]: Secondary | ICD-10-CM | POA: Diagnosis not present

## 2017-02-04 DIAGNOSIS — I495 Sick sinus syndrome: Secondary | ICD-10-CM | POA: Diagnosis not present

## 2017-02-04 DIAGNOSIS — Z880 Allergy status to penicillin: Secondary | ICD-10-CM | POA: Insufficient documentation

## 2017-02-04 HISTORY — PX: PPM GENERATOR CHANGEOUT: EP1233

## 2017-02-04 LAB — SURGICAL PCR SCREEN
MRSA, PCR: NEGATIVE
STAPHYLOCOCCUS AUREUS: NEGATIVE

## 2017-02-04 LAB — URINE CULTURE
MICRO NUMBER:: 81066995
SPECIMEN QUALITY: ADEQUATE

## 2017-02-04 SURGERY — PPM GENERATOR CHANGEOUT

## 2017-02-04 MED ORDER — SODIUM CHLORIDE 0.9 % IV SOLN
INTRAVENOUS | Status: DC
  Administered 2017-02-04: 09:00:00 via INTRAVENOUS

## 2017-02-04 MED ORDER — MIDAZOLAM HCL 5 MG/5ML IJ SOLN
INTRAMUSCULAR | Status: AC
  Filled 2017-02-04: qty 5

## 2017-02-04 MED ORDER — CHLORHEXIDINE GLUCONATE 4 % EX LIQD: 60.0000 mL | Freq: Once | CUTANEOUS | Status: DC

## 2017-02-04 MED ORDER — MIDAZOLAM HCL 5 MG/5ML IJ SOLN
INTRAMUSCULAR | Status: DC | PRN
  Administered 2017-02-04: 1 mg via INTRAVENOUS

## 2017-02-04 MED ORDER — ACETAMINOPHEN 325 MG PO TABS: 325.0000 mg | ORAL_TABLET | ORAL | Status: DC | PRN

## 2017-02-04 MED ORDER — SODIUM CHLORIDE 0.9 % IR SOLN
80.0000 mg | Status: AC
  Administered 2017-02-04: 80 mg
  Filled 2017-02-04: qty 2

## 2017-02-04 MED ORDER — HEPARIN (PORCINE) IN NACL 2-0.9 UNIT/ML-% IJ SOLN
INTRAMUSCULAR | Status: AC
  Filled 2017-02-04: qty 1000

## 2017-02-04 MED ORDER — ONDANSETRON HCL 4 MG/2ML IJ SOLN: 4.0000 mg | Freq: Four times a day (QID) | INTRAMUSCULAR | Status: DC | PRN

## 2017-02-04 MED ORDER — LIDOCAINE HCL 2 % IJ SOLN
INTRAMUSCULAR | Status: AC
  Filled 2017-02-04: qty 10

## 2017-02-04 MED ORDER — FENTANYL CITRATE (PF) 100 MCG/2ML IJ SOLN
INTRAMUSCULAR | Status: DC | PRN
  Administered 2017-02-04: 12.5 ug via INTRAVENOUS

## 2017-02-04 MED ORDER — LIDOCAINE HCL (PF) 1 % IJ SOLN
INTRAMUSCULAR | Status: DC | PRN
  Administered 2017-02-04: 30 mL

## 2017-02-04 MED ORDER — MUPIROCIN 2 % EX OINT
TOPICAL_OINTMENT | CUTANEOUS | Status: AC
  Administered 2017-02-04: 1
  Filled 2017-02-04: qty 22

## 2017-02-04 MED ORDER — VANCOMYCIN HCL IN DEXTROSE 1-5 GM/200ML-% IV SOLN
1000.0000 mg | INTRAVENOUS | Status: AC
  Administered 2017-02-04: 1000 mg via INTRAVENOUS

## 2017-02-04 MED ORDER — FENTANYL CITRATE (PF) 100 MCG/2ML IJ SOLN
INTRAMUSCULAR | Status: AC
  Filled 2017-02-04: qty 2

## 2017-02-04 MED ORDER — VANCOMYCIN HCL IN DEXTROSE 1-5 GM/200ML-% IV SOLN
INTRAVENOUS | Status: AC
  Filled 2017-02-04: qty 200

## 2017-02-04 SURGICAL SUPPLY — 9 items
CABLE SURGICAL S-101-97-12 (CABLE) ×2 IMPLANT
IPG PACE AZUR XT DR MRI W1DR01 (Pacemaker) ×1 IMPLANT
PACE AZURE XT DR MRI W1DR01 (Pacemaker) ×2 IMPLANT
PAD DEFIB LIFELINK (PAD) ×2 IMPLANT
SHEATH CLASSIC 7F (SHEATH) IMPLANT
SHEATH CLASSIC 8F (SHEATH) IMPLANT
SHEATH CLASSIC 9.5F (SHEATH) IMPLANT
SHEATH CLASSIC 9F (SHEATH) IMPLANT
TRAY PACEMAKER INSERTION (PACKS) ×2 IMPLANT

## 2017-02-04 NOTE — H&P (View-Only) (Signed)
HPI Alexis Cobb returns today for ongoing evaluation and management of her PPM as she has reached ERI. She is a pleasant 83 yo woman with a h/o CHB, s/p PPM insertion years ago. In the interim, she has fallen and broken her pelvis. She is now using a walker. She is living in assisted living. She uses a walker to ambulate. Allergies  Allergen Reactions  . Ace Inhibitors Rash  . Cephalexin Rash  . Penicillins Rash and Other (See Comments)    Has patient had a PCN reaction causing immediate rash, facial/tongue/throat swelling, SOB or lightheadedness with hypotension: No Has patient had a PCN reaction causing severe rash involving mucus membranes or skin necrosis: No Has patient had a PCN reaction that required hospitalization No Has patient had a PCN reaction occurring within the last 10 years: No If all of the above answers are "NO", then may proceed with Cephalosporin use.  Marland Kitchen Risedronate Sodium Rash     Current Outpatient Prescriptions  Medication Sig Dispense Refill  . aspirin EC 81 MG tablet Take 81 mg by mouth at bedtime.    . Calcium Carbonate-Vitamin D (CALCIUM 600+D) 600-400 MG-UNIT tablet Take 1 tablet by mouth 2 (two) times daily.    . cholecalciferol (VITAMIN D) 1000 units tablet Take 1,000 Units by mouth daily.    . furosemide (LASIX) 20 MG tablet Take 20 mg by mouth daily as needed for edema.    Marland Kitchen losartan (COZAAR) 100 MG tablet Take 100 mg by mouth daily.    Marland Kitchen lovastatin (MEVACOR) 40 MG tablet TAKE 1 TABLET EACH DAY. 30 tablet 0  . metoprolol succinate (TOPROL-XL) 50 MG 24 hr tablet Take 75 mg by mouth daily. Take with or immediately following a meal.    . Multiple Vitamin (MULTIVITAMIN WITH MINERALS) TABS tablet Take 1 tablet by mouth daily.    . nitroGLYCERIN (NITROSTAT) 0.4 MG SL tablet Place 0.4 mg under the tongue every 5 (five) minutes as needed for chest pain.     No current facility-administered medications for this visit.      Past Medical History:    Diagnosis Date  . Arthritis   . ARTHRITIS, RHEUMATOID, HX OF 01/06/2007  . Cervical stenosis of spine   . HTN (hypertension)   . Hyperlipidemia   . MITRAL REGURGITATION 01/06/2007  . OSTEOARTHRITIS 01/14/2007  . OSTEOPOROSIS 01/14/2007  . Other specified forms of hearing loss 08/01/2009  . PACEMAKER, PERMANENT 01/06/2007  . Raynaud's syndrome 01/06/2007    ROS:   All systems reviewed and negative except as noted in the HPI.   Past Surgical History:  Procedure Laterality Date  . ABDOMINAL HYSTERECTOMY    . APPENDECTOMY    . CATARACT EXTRACTION, BILATERAL    . MITRAL VALVE REPAIR    . ovarian tumor, benign    . PACEMAKER INSERTION       Family History  Problem Relation Age of Onset  . Coronary artery disease Unknown   . Hypertension Unknown      Social History   Social History  . Marital status: Widowed    Spouse name: N/A  . Number of children: N/A  . Years of education: N/A   Occupational History  . retired Catering manager Retired   Social History Main Topics  . Smoking status: Never Smoker  . Smokeless tobacco: Never Used  . Alcohol use No  . Drug use: No  . Sexual activity: Not on file   Other Topics Concern  . Not  on file   Social History Narrative  . No narrative on file     BP 122/78   Pulse 76   Ht 5\' 4"  (1.626 m)   Wt 106 lb 9.6 oz (48.4 kg)   SpO2 98%   BMI 18.30 kg/m   Physical Exam:  Chronically ill appearing 83 yo woman, NAD HEENT: Unremarkable Neck:  6 cm JVD, no thyromegally Lymphatics:  No adenopathy Back:  No CVA tenderness Lungs:  Clear with no wheezes HEART:  Regular rate rhythm, no murmurs, no rubs, no clicks Abd:  soft, positive bowel sounds, no organomegally, no rebound, no guarding Ext:  2 plus pulses, no edema, no cyanosis, no clubbing Skin:  No rashes no nodules Neuro:  CN II through XII intact, motor grossly intact  DEVICE  Normal device function.  See PaceArt for details.   Assess/Plan:  1. CHB - she is s/p PPM  insertion.  2. PPM - she has reached ERI. She will undergo PPM gen change as she has CHB. 3. Atrial fib - her rates are controlled. With her falls and pelvic fractures, she is no longer a good candidate for systemic anti-coagulation. 4. HTN - her blood pressure is well controlled. No change in meds.  92.D.

## 2017-02-04 NOTE — Interval H&P Note (Signed)
History and Physical Interval Note:  02/04/2017 1:37 PM  Alexis Cobb  has presented today for surgery, with the diagnosis of ERI  The various methods of treatment have been discussed with the patient and family. After consideration of risks, benefits and other options for treatment, the patient has consented to  Procedure(s): PPM GENERATOR CHANGEOUT (N/A) as a surgical intervention .  The patient's history has been reviewed, patient examined, no change in status, stable for surgery.  I have reviewed the patient's chart and labs.  Questions were answered to the patient's satisfaction.     [name].D.

## 2017-02-04 NOTE — Discharge Instructions (Signed)

## 2017-02-05 DIAGNOSIS — R300 Dysuria: Secondary | ICD-10-CM | POA: Insufficient documentation

## 2017-02-05 NOTE — Assessment & Plan Note (Signed)
stable overall by history and exam, recent data reviewed with pt, and pt to continue medical treatment as before,  to f/u any worsening symptoms or concerns BP Readings from Last 3 Encounters:  02/04/17 120/70  02/02/17 116/78  01/27/17 122/78

## 2017-02-05 NOTE — Assessment & Plan Note (Signed)
With high suspicion for UTI, for urine cx, empiric cipro course,  to f/u any worsening symptoms or concerns

## 2017-02-07 MED FILL — Heparin Sodium (Porcine) 2 Unit/ML in Sodium Chloride 0.9%: INTRAMUSCULAR | Qty: 1000 | Status: AC

## 2017-02-07 MED FILL — Lidocaine HCl Local Inj 2%: INTRAMUSCULAR | Qty: 10 | Status: AC

## 2017-02-16 DIAGNOSIS — M6281 Muscle weakness (generalized): Secondary | ICD-10-CM | POA: Diagnosis not present

## 2017-02-16 DIAGNOSIS — R2681 Unsteadiness on feet: Secondary | ICD-10-CM | POA: Diagnosis not present

## 2017-02-17 ENCOUNTER — Ambulatory Visit: Payer: Medicare Other

## 2017-02-17 DIAGNOSIS — R2681 Unsteadiness on feet: Secondary | ICD-10-CM | POA: Diagnosis not present

## 2017-02-17 DIAGNOSIS — M6281 Muscle weakness (generalized): Secondary | ICD-10-CM | POA: Diagnosis not present

## 2017-02-18 DIAGNOSIS — R2681 Unsteadiness on feet: Secondary | ICD-10-CM | POA: Diagnosis not present

## 2017-02-18 DIAGNOSIS — M6281 Muscle weakness (generalized): Secondary | ICD-10-CM | POA: Diagnosis not present

## 2017-02-21 ENCOUNTER — Ambulatory Visit (INDEPENDENT_AMBULATORY_CARE_PROVIDER_SITE_OTHER): Payer: Medicare Other | Admitting: *Deleted

## 2017-02-21 DIAGNOSIS — I495 Sick sinus syndrome: Secondary | ICD-10-CM

## 2017-02-21 LAB — CUP PACEART INCLINIC DEVICE CHECK
Battery Remaining Longevity: 112 mo
Brady Statistic AS VP Percent: 24.12 %
Brady Statistic RA Percent Paced: 75.68 %
Implantable Lead Implant Date: 20020104
Implantable Lead Location: 753860
Implantable Lead Model: 5076
Implantable Pulse Generator Implant Date: 20180928
Lead Channel Impedance Value: 285 Ohm
Lead Channel Impedance Value: 304 Ohm
Lead Channel Pacing Threshold Amplitude: 0.75 V
Lead Channel Setting Pacing Amplitude: 1.5 V
Lead Channel Setting Pacing Amplitude: 3 V
Lead Channel Setting Pacing Pulse Width: 0.4 ms
MDC IDC LEAD IMPLANT DT: 20020104
MDC IDC LEAD LOCATION: 753859
MDC IDC MSMT BATTERY VOLTAGE: 3.19 V
MDC IDC MSMT LEADCHNL RA IMPEDANCE VALUE: 361 Ohm
MDC IDC MSMT LEADCHNL RA PACING THRESHOLD PULSEWIDTH: 0.4 ms
MDC IDC MSMT LEADCHNL RA SENSING INTR AMPL: 0.625 mV
MDC IDC MSMT LEADCHNL RA SENSING INTR AMPL: 1 mV
MDC IDC MSMT LEADCHNL RV IMPEDANCE VALUE: 342 Ohm
MDC IDC MSMT LEADCHNL RV PACING THRESHOLD AMPLITUDE: 1.5 V
MDC IDC MSMT LEADCHNL RV PACING THRESHOLD PULSEWIDTH: 0.4 ms
MDC IDC MSMT LEADCHNL RV SENSING INTR AMPL: 3.125 mV
MDC IDC SESS DTM: 20181015123421
MDC IDC SET LEADCHNL RV SENSING SENSITIVITY: 1.2 mV
MDC IDC STAT BRADY AP VP PERCENT: 75.7 %
MDC IDC STAT BRADY AP VS PERCENT: 0 %
MDC IDC STAT BRADY AS VS PERCENT: 0.17 %
MDC IDC STAT BRADY RV PERCENT PACED: 99.83 %

## 2017-02-21 NOTE — Progress Notes (Signed)
Wound check appointment s/p generator replacement. Steri-strips removed. Wound without redness or edema. Incision edges approximated, wound well healed. Normal device function. Thresholds, sensing, and impedances consistent with implant measurements. Histogram distribution appropriate for patient and level of activity. No mode switches or high ventricular rates noted. Patient educated about wound care, arm mobility, and Carelink monitoring. ROV with GT 05/31/17.

## 2017-02-22 DIAGNOSIS — R2681 Unsteadiness on feet: Secondary | ICD-10-CM | POA: Diagnosis not present

## 2017-02-22 DIAGNOSIS — M6281 Muscle weakness (generalized): Secondary | ICD-10-CM | POA: Diagnosis not present

## 2017-02-23 DIAGNOSIS — M6281 Muscle weakness (generalized): Secondary | ICD-10-CM | POA: Diagnosis not present

## 2017-02-23 DIAGNOSIS — R2681 Unsteadiness on feet: Secondary | ICD-10-CM | POA: Diagnosis not present

## 2017-02-24 DIAGNOSIS — R2681 Unsteadiness on feet: Secondary | ICD-10-CM | POA: Diagnosis not present

## 2017-02-24 DIAGNOSIS — M6281 Muscle weakness (generalized): Secondary | ICD-10-CM | POA: Diagnosis not present

## 2017-02-25 DIAGNOSIS — R2681 Unsteadiness on feet: Secondary | ICD-10-CM | POA: Diagnosis not present

## 2017-02-25 DIAGNOSIS — M6281 Muscle weakness (generalized): Secondary | ICD-10-CM | POA: Diagnosis not present

## 2017-02-28 DIAGNOSIS — R2681 Unsteadiness on feet: Secondary | ICD-10-CM | POA: Diagnosis not present

## 2017-02-28 DIAGNOSIS — M6281 Muscle weakness (generalized): Secondary | ICD-10-CM | POA: Diagnosis not present

## 2017-03-02 DIAGNOSIS — R2681 Unsteadiness on feet: Secondary | ICD-10-CM | POA: Diagnosis not present

## 2017-03-02 DIAGNOSIS — M6281 Muscle weakness (generalized): Secondary | ICD-10-CM | POA: Diagnosis not present

## 2017-03-04 DIAGNOSIS — R2681 Unsteadiness on feet: Secondary | ICD-10-CM | POA: Diagnosis not present

## 2017-03-04 DIAGNOSIS — M6281 Muscle weakness (generalized): Secondary | ICD-10-CM | POA: Diagnosis not present

## 2017-03-07 DIAGNOSIS — M6281 Muscle weakness (generalized): Secondary | ICD-10-CM | POA: Diagnosis not present

## 2017-03-07 DIAGNOSIS — R2681 Unsteadiness on feet: Secondary | ICD-10-CM | POA: Diagnosis not present

## 2017-03-09 DIAGNOSIS — M6281 Muscle weakness (generalized): Secondary | ICD-10-CM | POA: Diagnosis not present

## 2017-03-09 DIAGNOSIS — R2681 Unsteadiness on feet: Secondary | ICD-10-CM | POA: Diagnosis not present

## 2017-03-11 ENCOUNTER — Telehealth: Payer: Self-pay | Admitting: Internal Medicine

## 2017-03-11 DIAGNOSIS — M6281 Muscle weakness (generalized): Secondary | ICD-10-CM | POA: Diagnosis not present

## 2017-03-11 DIAGNOSIS — R2681 Unsteadiness on feet: Secondary | ICD-10-CM | POA: Diagnosis not present

## 2017-03-11 DIAGNOSIS — R300 Dysuria: Secondary | ICD-10-CM

## 2017-03-11 NOTE — Addendum Note (Signed)
Addended by: Corwin Levins on: 03/11/2017 12:46 PM   Modules accepted: Orders

## 2017-03-11 NOTE — Telephone Encounter (Signed)
Spoke with Selena Batten and they have been informed, will bring patient in on Monday.

## 2017-03-11 NOTE — Telephone Encounter (Signed)
Noted  

## 2017-03-11 NOTE — Telephone Encounter (Signed)
Patient's grand daughter called and stated she believes the patient has a UTI. She has set up an appointment for Monday. But she wanted to know if she could just bring in a sample because the patient is a facility and it would be easier. I did informed we do like for people to set up a appointment.  Please advise. Thank you.

## 2017-03-11 NOTE — Telephone Encounter (Signed)
Unfortunately we cannot accept proferred samples  I will place orders to have done in the LAB  Please also inform famly she could be seen at [facility] tomorrow as well

## 2017-03-13 NOTE — Progress Notes (Deleted)
Subjective:    Patient ID: Alexis Cobb, female    DOB: 05/09/1925, 83 y.o.   MRN: [id number]  HPI She is here for an acute visit.   ? UTI:    Medications and allergies reviewed with patient and updated if appropriate.  Patient Active Problem List   Diagnosis Date Noted  . Dysuria 02/05/2017  . Excessive thirst 10/19/2016  . Hearing loss 10/19/2016  . Pelvic fracture (HCC) 10/19/2016  . Pleural effusion   . S/P thoracentesis   . Acute blood loss anemia   . Aspiration pneumonia (HCC)   . Shortness of breath   . Sore throat   . Anemia   . Acute respiratory failure with hypoxia (HCC) 05/01/2016  . Diarrhea 05/01/2016  . Rash 05/31/2015  . Hives 12/10/2014  . Chronic venous insufficiency 12/10/2014  . Peripheral neuropathy 12/12/2013  . Right shoulder pain 07/31/2012  . Anxiety state 07/31/2012  . Sinoatrial node dysfunction (HCC) 11/23/2011  . Polyarthropathy 12/31/2010  . Benign head tremor 12/31/2010  . Cervical radicular pain 12/31/2010  . Weakness 12/31/2010  . Encounter for well adult exam with abnormal findings 11/27/2010  . OSTEOARTHRITIS 01/14/2007  . OSTEOPOROSIS 01/14/2007  . Hyperlipidemia 01/06/2007  . MITRAL REGURGITATION 01/06/2007  . Essential hypertension 01/06/2007  . Raynaud's syndrome 01/06/2007  . PACEMAKER, PERMANENT 01/06/2007    Current Outpatient Medications on File Prior to Visit  Medication Sig Dispense Refill  . aspirin EC 81 MG tablet Take 81 mg by mouth at bedtime.    . Calcium Carbonate-Vitamin D (CALCIUM 600+D) 600-400 MG-UNIT tablet Take 1 tablet by mouth 2 (two) times daily.    . cholecalciferol (VITAMIN D) 1000 units tablet Take 1,000 Units by mouth daily.    . ciprofloxacin (CIPRO) 250 MG tablet Take 1 tablet (250 mg total) by mouth 2 (two) times daily. 20 tablet 0  . furosemide (LASIX) 20 MG tablet Take 20 mg by mouth daily as needed for edema.    Marland Kitchen losartan (COZAAR) 100 MG tablet Take 100 mg by mouth.     . lovastatin  (MEVACOR) 40 MG tablet TAKE 1 TABLET EACH DAY. (Patient taking differently: Take 40 mg by mouth daily) 30 tablet 0  . metoprolol succinate (TOPROL-XL) 50 MG 24 hr tablet Take 75 mg by mouth daily. Take with or immediately following a meal.    . Multiple Vitamin (MULTIVITAMIN WITH MINERALS) TABS tablet Take 1 tablet by mouth daily.    . nitroGLYCERIN (NITROSTAT) 0.4 MG SL tablet Place 0.4 mg under the tongue every 5 (five) minutes as needed for chest pain.     No current facility-administered medications on file prior to visit.     Past Medical History:  Diagnosis Date  . Arthritis   . ARTHRITIS, RHEUMATOID, HX OF 01/06/2007  . Cervical stenosis of spine   . HTN (hypertension)   . Hyperlipidemia   . MITRAL REGURGITATION 01/06/2007  . OSTEOARTHRITIS 01/14/2007  . OSTEOPOROSIS 01/14/2007  . Other specified forms of hearing loss 08/01/2009  . PACEMAKER, PERMANENT 01/06/2007  . Raynaud's syndrome 01/06/2007    Past Surgical History:  Procedure Laterality Date  . ABDOMINAL HYSTERECTOMY    . APPENDECTOMY    . CATARACT EXTRACTION, BILATERAL    . MITRAL VALVE REPAIR    . ovarian tumor, benign    . PACEMAKER INSERTION      Social History   Socioeconomic History  . Marital status: Widowed    Spouse name: Not on file  . Number  of children: Not on file  . Years of education: Not on file  . Highest education level: Not on file  Social Needs  . Financial resource strain: Not on file  . Food insecurity - worry: Not on file  . Food insecurity - inability: Not on file  . Transportation needs - medical: Not on file  . Transportation needs - non-medical: Not on file  Occupational History  . Occupation: retired Printmaker: RETIRED  Tobacco Use  . Smoking status: Never Smoker  . Smokeless tobacco: Never Used  Substance and Sexual Activity  . Alcohol use: No    Alcohol/week: 0.0 oz  . Drug use: No  . Sexual activity: Not on file  Other Topics Concern  . Not on file  Social  History Narrative  . Not on file    Family History  Problem Relation Age of Onset  . Coronary artery disease Unknown   . Hypertension Unknown     Review of Systems     Objective:  There were no vitals filed for this visit. There were no vitals filed for this visit. There is no height or weight on file to calculate BMI.  Wt Readings from Last 3 Encounters:  02/04/17 107 lb (48.5 kg)  02/02/17 107 lb (48.5 kg)  01/27/17 106 lb 9.6 oz (48.4 kg)     Physical Exam        Assessment & Plan:   See Problem List for Assessment and Plan of chronic medical problems.

## 2017-03-14 ENCOUNTER — Ambulatory Visit: Payer: Medicare Other | Admitting: Internal Medicine

## 2017-03-14 DIAGNOSIS — M6281 Muscle weakness (generalized): Secondary | ICD-10-CM | POA: Diagnosis not present

## 2017-03-14 DIAGNOSIS — R2681 Unsteadiness on feet: Secondary | ICD-10-CM | POA: Diagnosis not present

## 2017-03-15 DIAGNOSIS — R2681 Unsteadiness on feet: Secondary | ICD-10-CM | POA: Diagnosis not present

## 2017-03-15 DIAGNOSIS — M6281 Muscle weakness (generalized): Secondary | ICD-10-CM | POA: Diagnosis not present

## 2017-03-16 DIAGNOSIS — R2681 Unsteadiness on feet: Secondary | ICD-10-CM | POA: Diagnosis not present

## 2017-03-16 DIAGNOSIS — M6281 Muscle weakness (generalized): Secondary | ICD-10-CM | POA: Diagnosis not present

## 2017-03-17 DIAGNOSIS — R2681 Unsteadiness on feet: Secondary | ICD-10-CM | POA: Diagnosis not present

## 2017-03-17 DIAGNOSIS — M6281 Muscle weakness (generalized): Secondary | ICD-10-CM | POA: Diagnosis not present

## 2017-03-18 DIAGNOSIS — M6281 Muscle weakness (generalized): Secondary | ICD-10-CM | POA: Diagnosis not present

## 2017-03-18 DIAGNOSIS — R2681 Unsteadiness on feet: Secondary | ICD-10-CM | POA: Diagnosis not present

## 2017-03-21 DIAGNOSIS — R2681 Unsteadiness on feet: Secondary | ICD-10-CM | POA: Diagnosis not present

## 2017-03-21 DIAGNOSIS — M6281 Muscle weakness (generalized): Secondary | ICD-10-CM | POA: Diagnosis not present

## 2017-03-22 DIAGNOSIS — M6281 Muscle weakness (generalized): Secondary | ICD-10-CM | POA: Diagnosis not present

## 2017-03-22 DIAGNOSIS — R2681 Unsteadiness on feet: Secondary | ICD-10-CM | POA: Diagnosis not present

## 2017-03-23 DIAGNOSIS — M6281 Muscle weakness (generalized): Secondary | ICD-10-CM | POA: Diagnosis not present

## 2017-03-23 DIAGNOSIS — R2681 Unsteadiness on feet: Secondary | ICD-10-CM | POA: Diagnosis not present

## 2017-03-25 DIAGNOSIS — R2681 Unsteadiness on feet: Secondary | ICD-10-CM | POA: Diagnosis not present

## 2017-03-25 DIAGNOSIS — M6281 Muscle weakness (generalized): Secondary | ICD-10-CM | POA: Diagnosis not present

## 2017-03-28 DIAGNOSIS — M6281 Muscle weakness (generalized): Secondary | ICD-10-CM | POA: Diagnosis not present

## 2017-03-28 DIAGNOSIS — R2681 Unsteadiness on feet: Secondary | ICD-10-CM | POA: Diagnosis not present

## 2017-03-30 DIAGNOSIS — R2681 Unsteadiness on feet: Secondary | ICD-10-CM | POA: Diagnosis not present

## 2017-03-30 DIAGNOSIS — M6281 Muscle weakness (generalized): Secondary | ICD-10-CM | POA: Diagnosis not present

## 2017-04-01 DIAGNOSIS — M6281 Muscle weakness (generalized): Secondary | ICD-10-CM | POA: Diagnosis not present

## 2017-04-01 DIAGNOSIS — R2681 Unsteadiness on feet: Secondary | ICD-10-CM | POA: Diagnosis not present

## 2017-04-04 DIAGNOSIS — M6281 Muscle weakness (generalized): Secondary | ICD-10-CM | POA: Diagnosis not present

## 2017-04-04 DIAGNOSIS — R2681 Unsteadiness on feet: Secondary | ICD-10-CM | POA: Diagnosis not present

## 2017-04-06 DIAGNOSIS — R2681 Unsteadiness on feet: Secondary | ICD-10-CM | POA: Diagnosis not present

## 2017-04-06 DIAGNOSIS — M6281 Muscle weakness (generalized): Secondary | ICD-10-CM | POA: Diagnosis not present

## 2017-04-08 DIAGNOSIS — M6281 Muscle weakness (generalized): Secondary | ICD-10-CM | POA: Diagnosis not present

## 2017-04-08 DIAGNOSIS — R2681 Unsteadiness on feet: Secondary | ICD-10-CM | POA: Diagnosis not present

## 2017-04-11 DIAGNOSIS — M6281 Muscle weakness (generalized): Secondary | ICD-10-CM | POA: Diagnosis not present

## 2017-04-11 DIAGNOSIS — R2681 Unsteadiness on feet: Secondary | ICD-10-CM | POA: Diagnosis not present

## 2017-04-13 DIAGNOSIS — M6281 Muscle weakness (generalized): Secondary | ICD-10-CM | POA: Diagnosis not present

## 2017-04-13 DIAGNOSIS — R2681 Unsteadiness on feet: Secondary | ICD-10-CM | POA: Diagnosis not present

## 2017-04-15 DIAGNOSIS — M6281 Muscle weakness (generalized): Secondary | ICD-10-CM | POA: Diagnosis not present

## 2017-04-15 DIAGNOSIS — R2681 Unsteadiness on feet: Secondary | ICD-10-CM | POA: Diagnosis not present

## 2017-04-20 ENCOUNTER — Ambulatory Visit: Payer: Medicare Other | Admitting: Internal Medicine

## 2017-04-21 DIAGNOSIS — R2681 Unsteadiness on feet: Secondary | ICD-10-CM | POA: Diagnosis not present

## 2017-04-21 DIAGNOSIS — M6281 Muscle weakness (generalized): Secondary | ICD-10-CM | POA: Diagnosis not present

## 2017-04-22 DIAGNOSIS — M6281 Muscle weakness (generalized): Secondary | ICD-10-CM | POA: Diagnosis not present

## 2017-04-22 DIAGNOSIS — R2681 Unsteadiness on feet: Secondary | ICD-10-CM | POA: Diagnosis not present

## 2017-04-26 DIAGNOSIS — M6281 Muscle weakness (generalized): Secondary | ICD-10-CM | POA: Diagnosis not present

## 2017-04-26 DIAGNOSIS — R2681 Unsteadiness on feet: Secondary | ICD-10-CM | POA: Diagnosis not present

## 2017-04-28 DIAGNOSIS — M6281 Muscle weakness (generalized): Secondary | ICD-10-CM | POA: Diagnosis not present

## 2017-04-28 DIAGNOSIS — R2681 Unsteadiness on feet: Secondary | ICD-10-CM | POA: Diagnosis not present

## 2017-05-18 ENCOUNTER — Ambulatory Visit: Payer: Medicare Other | Admitting: Internal Medicine

## 2017-05-18 ENCOUNTER — Encounter: Payer: Self-pay | Admitting: Internal Medicine

## 2017-05-18 VITALS — BP 124/76 | HR 60 | Temp 97.6°F | Ht 64.0 in | Wt 104.0 lb

## 2017-05-18 DIAGNOSIS — R300 Dysuria: Secondary | ICD-10-CM

## 2017-05-18 DIAGNOSIS — I10 Essential (primary) hypertension: Secondary | ICD-10-CM

## 2017-05-18 DIAGNOSIS — F411 Generalized anxiety disorder: Secondary | ICD-10-CM

## 2017-05-18 NOTE — Patient Instructions (Signed)
Please continue all other medications as before, and refills have been done if requested.  Please have the pharmacy call with any other refills you may need.  Please continue your efforts at being more active, low cholesterol diet, and weight control..  Please keep your appointments with your specialists as you may have planned  Please return in 4 months, or sooner if needed 

## 2017-05-18 NOTE — Progress Notes (Signed)
Subjective:    Patient ID: Alexis Cobb, female    DOB: Nov 06, 1924, 84 y.o.   MRN: [id number]  HPI   Here to f/u with c/o dysuria x 3 days actually improved today, but Denies urinary symptoms such as dysuria, frequency, urgency, flank pain, hematuria or n/v, fever, chills. Last UTI about 3 mo ago, Denies worsening reflux, abd pain, dysphagia, n/v, bowel change or blood.  Pt denies chest pain, increased sob or doe, wheezing, orthopnea, PND, increased LE swelling, palpitations, dizziness or syncope. S/p PPM battery change and done well since. No recent falls in > 1 yr, walks with walker, Last PT finished 2018.  Denies worsening depressive symptoms, suicidal ideation, or panic; has ongoing anxiety, stable recent Past Medical History:  Diagnosis Date  . Arthritis   . ARTHRITIS, RHEUMATOID, HX OF 01/06/2007  . Cervical stenosis of spine   . HTN (hypertension)   . Hyperlipidemia   . MITRAL REGURGITATION 01/06/2007  . OSTEOARTHRITIS 01/14/2007  . OSTEOPOROSIS 01/14/2007  . Other specified forms of hearing loss 08/01/2009  . PACEMAKER, PERMANENT 01/06/2007  . Raynaud's syndrome 01/06/2007   Past Surgical History:  Procedure Laterality Date  . ABDOMINAL HYSTERECTOMY    . APPENDECTOMY    . CATARACT EXTRACTION, BILATERAL    . MITRAL VALVE REPAIR    . ovarian tumor, benign    . PACEMAKER INSERTION    . PPM GENERATOR CHANGEOUT N/A 02/04/2017   Procedure: PPM GENERATOR CHANGEOUT;  Surgeon: Marinus Maw, MD;  Location: [facility] INVASIVE CV LAB;  Service: Cardiovascular;  Laterality: N/A;    reports that  has never smoked. she has never used smokeless tobacco. She reports that she does not drink alcohol or use drugs. family history includes Coronary artery disease in her unknown relative; Hypertension in her unknown relative. Allergies  Allergen Reactions  . Ace Inhibitors Rash  . Cephalexin Rash  . Penicillins Rash and Other (See Comments)    Has patient had a PCN reaction causing immediate rash,  facial/tongue/throat swelling, SOB or lightheadedness with hypotension: No Has patient had a PCN reaction causing severe rash involving mucus membranes or skin necrosis: No Has patient had a PCN reaction that required hospitalization No Has patient had a PCN reaction occurring within the last 10 years: No If all of the above answers are "NO", then may proceed with Cephalosporin use.  Marland Kitchen Risedronate Sodium Rash   Current Outpatient Medications on File Prior to Visit  Medication Sig Dispense Refill  . aspirin EC 81 MG tablet Take 81 mg by mouth at bedtime.    . Calcium Carbonate-Vitamin D (CALCIUM 600+D) 600-400 MG-UNIT tablet Take 1 tablet by mouth 2 (two) times daily.    . cholecalciferol (VITAMIN D) 1000 units tablet Take 1,000 Units by mouth daily.    . ciprofloxacin (CIPRO) 250 MG tablet Take 1 tablet (250 mg total) by mouth 2 (two) times daily. 20 tablet 0  . furosemide (LASIX) 20 MG tablet Take 20 mg by mouth daily as needed for edema.    Marland Kitchen losartan (COZAAR) 100 MG tablet Take 100 mg by mouth.     . lovastatin (MEVACOR) 40 MG tablet TAKE 1 TABLET EACH DAY. (Patient taking differently: Take 40 mg by mouth daily) 30 tablet 0  . metoprolol succinate (TOPROL-XL) 50 MG 24 hr tablet Take 75 mg by mouth daily. Take with or immediately following a meal.    . Multiple Vitamin (MULTIVITAMIN WITH MINERALS) TABS tablet Take 1 tablet by mouth daily.    Marland Kitchen  nitroGLYCERIN (NITROSTAT) 0.4 MG SL tablet Place 0.4 mg under the tongue every 5 (five) minutes as needed for chest pain.     No current facility-administered medications on file prior to visit.    Review of Systems  Constitutional: Negative for other unusual diaphoresis or sweats HENT: Negative for ear discharge or swelling Eyes: Negative for other worsening visual disturbances Respiratory: Negative for stridor or other swelling  Gastrointestinal: Negative for worsening distension or other blood Genitourinary: Negative for retention or other  urinary change Musculoskeletal: Negative for other MSK pain or swelling Skin: Negative for color change or other new lesions Neurological: Negative for worsening tremors and other numbness  Psychiatric/Behavioral: Negative for worsening agitation or other fatigue All other system neg per pt    Objective:   Physical Exam BP 124/76   Pulse 60   Temp 97.6 F (36.4 C) (Oral)   Ht 5\' 4"  (1.626 m)   Wt 104 lb (47.2 kg)   SpO2 97%   BMI 17.85 kg/m  VS noted,  Constitutional: Pt appears in NAD HENT: Head: NCAT.  Right Ear: External ear normal.  Left Ear: External ear normal.  Eyes: . Pupils are equal, round, and reactive to light. Conjunctivae and EOM are normal Nose: without d/c or deformity Neck: Neck supple. Gross normal ROM Cardiovascular: Normal rate and regular rhythm.   Pulmonary/Chest: Effort normal and breath sounds without rales or wheezing.  Abd:  Soft, NT, ND, + BS, no organomegaly Neurological: Pt is alert. At baseline orientation, motor grossly intact Skin: Skin is warm. No rashes, other new lesions, Psychiatric: Pt behavior is normal without agitation  Right ankle and foot trace edema    Assessment & Plan:

## 2017-05-20 NOTE — Assessment & Plan Note (Signed)
stable overall by history and exam, and pt to continue medical treatment as before,  to f/u any worsening symptoms or concerns 

## 2017-05-20 NOTE — Assessment & Plan Note (Signed)
BP Readings from Last 3 Encounters:  05/18/17 124/76  02/04/17 120/70  02/02/17 116/78  stable overall by history and exam, recent data reviewed with pt, and pt to continue medical treatment as before,  to f/u any worsening symptoms or concerns

## 2017-05-20 NOTE — Assessment & Plan Note (Signed)
Mild, improved today, pt decides she does not want UA checked,  to f/u any worsening symptoms or concerns

## 2017-05-31 ENCOUNTER — Encounter: Payer: Medicare Other | Admitting: Internal Medicine

## 2017-06-02 ENCOUNTER — Encounter: Payer: Self-pay | Admitting: Internal Medicine

## 2017-06-02 ENCOUNTER — Ambulatory Visit: Payer: Medicare Other | Admitting: Internal Medicine

## 2017-06-02 VITALS — BP 130/68 | HR 63 | Resp 16 | Ht 64.0 in | Wt 106.2 lb

## 2017-06-02 DIAGNOSIS — I495 Sick sinus syndrome: Secondary | ICD-10-CM

## 2017-06-02 DIAGNOSIS — Z950 Presence of cardiac pacemaker: Secondary | ICD-10-CM | POA: Diagnosis not present

## 2017-06-02 LAB — CUP PACEART INCLINIC DEVICE CHECK
Battery Remaining Longevity: 73 mo
Brady Statistic AS VP Percent: 40.75 %
Brady Statistic RA Percent Paced: 59.07 %
Brady Statistic RV Percent Paced: 99.9 %
Date Time Interrogation Session: 20190124145701
Implantable Lead Implant Date: 20020104
Implantable Lead Location: 753859
Implantable Lead Model: 5076
Lead Channel Impedance Value: 285 Ohm
Lead Channel Impedance Value: 304 Ohm
Lead Channel Impedance Value: 361 Ohm
Lead Channel Pacing Threshold Amplitude: 0.75 V
Lead Channel Pacing Threshold Pulse Width: 0.4 ms
Lead Channel Pacing Threshold Pulse Width: 0.8 ms
Lead Channel Setting Sensing Sensitivity: 1.2 mV
MDC IDC LEAD IMPLANT DT: 20020104
MDC IDC LEAD LOCATION: 753860
MDC IDC MSMT BATTERY VOLTAGE: 3.11 V
MDC IDC MSMT LEADCHNL RA SENSING INTR AMPL: 0.875 mV
MDC IDC MSMT LEADCHNL RV IMPEDANCE VALUE: 342 Ohm
MDC IDC MSMT LEADCHNL RV PACING THRESHOLD AMPLITUDE: 1.75 V
MDC IDC MSMT LEADCHNL RV SENSING INTR AMPL: 3.5 mV
MDC IDC PG IMPLANT DT: 20180928
MDC IDC SET LEADCHNL RA PACING AMPLITUDE: 1.5 V
MDC IDC SET LEADCHNL RV PACING AMPLITUDE: 3.5 V
MDC IDC SET LEADCHNL RV PACING PULSEWIDTH: 0.8 ms
MDC IDC STAT BRADY AP VP PERCENT: 59.15 %
MDC IDC STAT BRADY AP VS PERCENT: 0 %
MDC IDC STAT BRADY AS VS PERCENT: 0.1 %

## 2017-06-02 NOTE — Patient Instructions (Signed)
Medication Instructions:  Your physician recommends that you continue on your current medications as directed. Please refer to the Current Medication list given to you today.  Labwork: None ordered.  Testing/Procedures: None ordered.  Follow-Up: Your physician wants you to follow-up in: 9 months with Dr. Ladona Ridgel.   You will receive a reminder letter in the mail two months in advance. If you don't receive a letter, please call our office to schedule the follow-up appointment.  Remote monitoring is used to monitor your Pacemaker from home. This monitoring reduces the number of office visits required to check your device to one time per year. It allows [place] to keep an eye on the functioning of your device to ensure it is working properly. You are scheduled for a device check from home on 09/01/2017. You may send your transmission at any time that day. If you have a wireless device, the transmission will be sent automatically. After your physician reviews your transmission, you will receive a postcard with your next transmission date.   Any Other Special Instructions Will Be Listed Below (If Applicable).  If you need a refill on your cardiac medications before your next appointment, please call your pharmacy.

## 2017-06-02 NOTE — Progress Notes (Signed)
HPI Alexis Cobb returns today for followup. She is a pleasant elderly woman with CHB, s/p PPM insertion. She has done well in the interim although does note that her generator change out was more painful (subpectoral implant) but she has had no trouble healing. She has become a bit more sedentary. No chest pain or sob. Allergies  Allergen Reactions  . Ace Inhibitors Rash  . Cephalexin Rash  . Penicillins Rash and Other (See Comments)    Has patient had a PCN reaction causing immediate rash, facial/tongue/throat swelling, SOB or lightheadedness with hypotension: No Has patient had a PCN reaction causing severe rash involving mucus membranes or skin necrosis: No Has patient had a PCN reaction that required hospitalization No Has patient had a PCN reaction occurring within the last 10 years: No If all of the above answers are "NO", then may proceed with Cephalosporin use.  Marland Kitchen Risedronate Sodium Rash     Current Outpatient Medications  Medication Sig Dispense Refill  . aspirin EC 81 MG tablet Take 81 mg by mouth at bedtime.    . Calcium Carbonate-Vitamin D (CALCIUM 600+D) 600-400 MG-UNIT tablet Take 1 tablet by mouth 2 (two) times daily.    . cholecalciferol (VITAMIN D) 1000 units tablet Take 1,000 Units by mouth daily.    . ciprofloxacin (CIPRO) 250 MG tablet Take 1 tablet (250 mg total) by mouth 2 (two) times daily. 20 tablet 0  . furosemide (LASIX) 20 MG tablet Take 20 mg by mouth daily as needed for edema.    Marland Kitchen losartan (COZAAR) 100 MG tablet Take 100 mg by mouth.     . lovastatin (MEVACOR) 40 MG tablet TAKE 1 TABLET EACH DAY. (Patient taking differently: Take 40 mg by mouth daily) 30 tablet 0  . metoprolol succinate (TOPROL-XL) 50 MG 24 hr tablet Take 75 mg by mouth daily. Take with or immediately following a meal.    . Multiple Vitamin (MULTIVITAMIN WITH MINERALS) TABS tablet Take 1 tablet by mouth daily.    . nitroGLYCERIN (NITROSTAT) 0.4 MG SL tablet Place 0.4 mg under  the tongue every 5 (five) minutes as needed for chest pain.     No current facility-administered medications for this visit.      Past Medical History:  Diagnosis Date  . Arthritis   . ARTHRITIS, RHEUMATOID, HX OF 01/06/2007  . Cervical stenosis of spine   . HTN (hypertension)   . Hyperlipidemia   . MITRAL REGURGITATION 01/06/2007  . OSTEOARTHRITIS 01/14/2007  . OSTEOPOROSIS 01/14/2007  . Other specified forms of hearing loss 08/01/2009  . PACEMAKER, PERMANENT 01/06/2007  . Raynaud's syndrome 01/06/2007    ROS:   All systems reviewed and negative except as noted in the HPI.   Past Surgical History:  Procedure Laterality Date  . ABDOMINAL HYSTERECTOMY    . APPENDECTOMY    . CATARACT EXTRACTION, BILATERAL    . MITRAL VALVE REPAIR    . ovarian tumor, benign    . PACEMAKER INSERTION    . PPM GENERATOR CHANGEOUT N/A 02/04/2017   Procedure: PPM GENERATOR CHANGEOUT;  Surgeon: Marinus Maw, MD;  Location: [facility] INVASIVE CV LAB;  Service: Cardiovascular;  Laterality: N/A;     Family History  Problem Relation Age of Onset  . Coronary artery disease Unknown   . Hypertension Unknown      Social History   Socioeconomic History  . Marital status: Widowed    Spouse name: Not on file  . Number of children: Not  on file  . Years of education: Not on file  . Highest education level: Not on file  Social Needs  . Financial resource strain: Not on file  . Food insecurity - worry: Not on file  . Food insecurity - inability: Not on file  . Transportation needs - medical: Not on file  . Transportation needs - non-medical: Not on file  Occupational History  . Occupation: retired Printmaker: RETIRED  Tobacco Use  . Smoking status: Never Smoker  . Smokeless tobacco: Never Used  Substance and Sexual Activity  . Alcohol use: No    Alcohol/week: 0.0 oz  . Drug use: No  . Sexual activity: Not on file  Other Topics Concern  . Not on file  Social History Narrative  . Not on  file     BP 130/68   Pulse 63   Resp 16   Ht 5\' 4"  (1.626 m)   Wt 106 lb 3.2 oz (48.2 kg)   SpO2 96%   BMI 18.23 kg/m   Physical Exam:  Well appearing 84 yo woman, NAD HEENT: Unremarkable Neck:  No JVD, no thyromegally Lymphatics:  No adenopathy Back:  No CVA tenderness Lungs:  Clear with no wheezes HEART:  Regular rate rhythm, no murmurs, no rubs, no clicks Abd:  soft, positive bowel sounds, no organomegally, no rebound, no guarding Ext:  2 plus pulses, no edema, no cyanosis, no clubbing Skin:  No rashes no nodules Neuro:  CN II through XII intact, motor grossly intact  EKG - NSR with ventricular pacing  DEVICE  Normal device function.  See PaceArt for details. Her Rv threshold remains elevated.  Assess/Plan: 1. CHB - she is asymptomatic, s/p PPM. 2. PPM - her medtronic DDD PM is working normally except for an elevated pacing impedence. Will follow. 3. HTN - her blood pressure remains well controlled. Will follow.  95.D

## 2017-07-27 ENCOUNTER — Telehealth: Payer: Self-pay | Admitting: Internal Medicine

## 2017-07-27 MED ORDER — SULFAMETHOXAZOLE-TRIMETHOPRIM 800-160 MG PO TABS: 1.0000 | ORAL_TABLET | Freq: Two times a day (BID) | ORAL | 0 refills | Status: DC

## 2017-07-27 NOTE — Telephone Encounter (Signed)
Received UA results abnormal today, c/w infection  OK for septra DS bid x 7 day - done to [facility] to let pt or caretaker know

## 2017-07-28 ENCOUNTER — Telehealth: Payer: Self-pay | Admitting: Internal Medicine

## 2017-07-28 MED ORDER — SULFAMETHOXAZOLE-TRIMETHOPRIM 800-160 MG PO TABS: 1.0000 | ORAL_TABLET | Freq: Two times a day (BID) | ORAL | 0 refills | Status: DC

## 2017-07-28 NOTE — Telephone Encounter (Signed)
Returning call, Alexis Cobb [place], [phone]

## 2017-07-28 NOTE — Telephone Encounter (Signed)
Pt has to have all meds through [facility]. Script must be faxed to them at [phone]. Can a hard copy be done? Please advise.

## 2017-07-28 NOTE — Telephone Encounter (Signed)
Done hardcopy to [facility]  

## 2017-07-28 NOTE — Telephone Encounter (Signed)
No answer at patients phone number.  Caregiver, Selena Batten, is currently in the hospital. Was told that by the gentleman that answered her contact number.

## 2017-09-01 ENCOUNTER — Ambulatory Visit (INDEPENDENT_AMBULATORY_CARE_PROVIDER_SITE_OTHER): Payer: Medicare Other | Admitting: *Deleted

## 2017-09-01 DIAGNOSIS — I495 Sick sinus syndrome: Secondary | ICD-10-CM

## 2017-09-02 ENCOUNTER — Encounter: Payer: Self-pay | Admitting: Cardiology

## 2017-09-02 NOTE — Progress Notes (Signed)
Remote pacemaker transmission.   

## 2017-09-27 LAB — CUP PACEART REMOTE DEVICE CHECK
Battery Remaining Longevity: 69 mo
Battery Voltage: 3.01 V
Brady Statistic AP VS Percent: 0 %
Brady Statistic AS VP Percent: 42.06 %
Brady Statistic AS VS Percent: 0.02 %
Brady Statistic RA Percent Paced: 57.77 %
Brady Statistic RV Percent Paced: 99.97 %
Date Time Interrogation Session: 20190425051913
Implantable Lead Implant Date: 20020104
Implantable Lead Implant Date: 20020104
Implantable Lead Location: 753859
Implantable Lead Location: 753860
Implantable Lead Model: 5076
Implantable Pulse Generator Implant Date: 20180928
Lead Channel Impedance Value: 285 Ohm
Lead Channel Impedance Value: 304 Ohm
Lead Channel Pacing Threshold Amplitude: 0.75 V
Lead Channel Pacing Threshold Pulse Width: 0.4 ms
Lead Channel Sensing Intrinsic Amplitude: 1 mV
Lead Channel Sensing Intrinsic Amplitude: 1 mV
Lead Channel Sensing Intrinsic Amplitude: 3.5 mV
Lead Channel Setting Pacing Pulse Width: 0.8 ms
MDC IDC MSMT LEADCHNL RA IMPEDANCE VALUE: 361 Ohm
MDC IDC MSMT LEADCHNL RV IMPEDANCE VALUE: 323 Ohm
MDC IDC MSMT LEADCHNL RV PACING THRESHOLD AMPLITUDE: 2.25 V
MDC IDC MSMT LEADCHNL RV PACING THRESHOLD PULSEWIDTH: 0.4 ms
MDC IDC SET LEADCHNL RA PACING AMPLITUDE: 1.5 V
MDC IDC SET LEADCHNL RV PACING AMPLITUDE: 3.5 V
MDC IDC SET LEADCHNL RV SENSING SENSITIVITY: 1.2 mV
MDC IDC STAT BRADY AP VP PERCENT: 57.91 %

## 2017-10-05 IMAGING — DX DG CHEST 2V
2 series · 2 of 2 positions shown · non-contrast
Comparison: May 05, 2016

CLINICAL DATA: Bilateral lower extremity edema.  Cardiac arrhythmia

EXAM:
CHEST  2 VIEW

[chest pa]
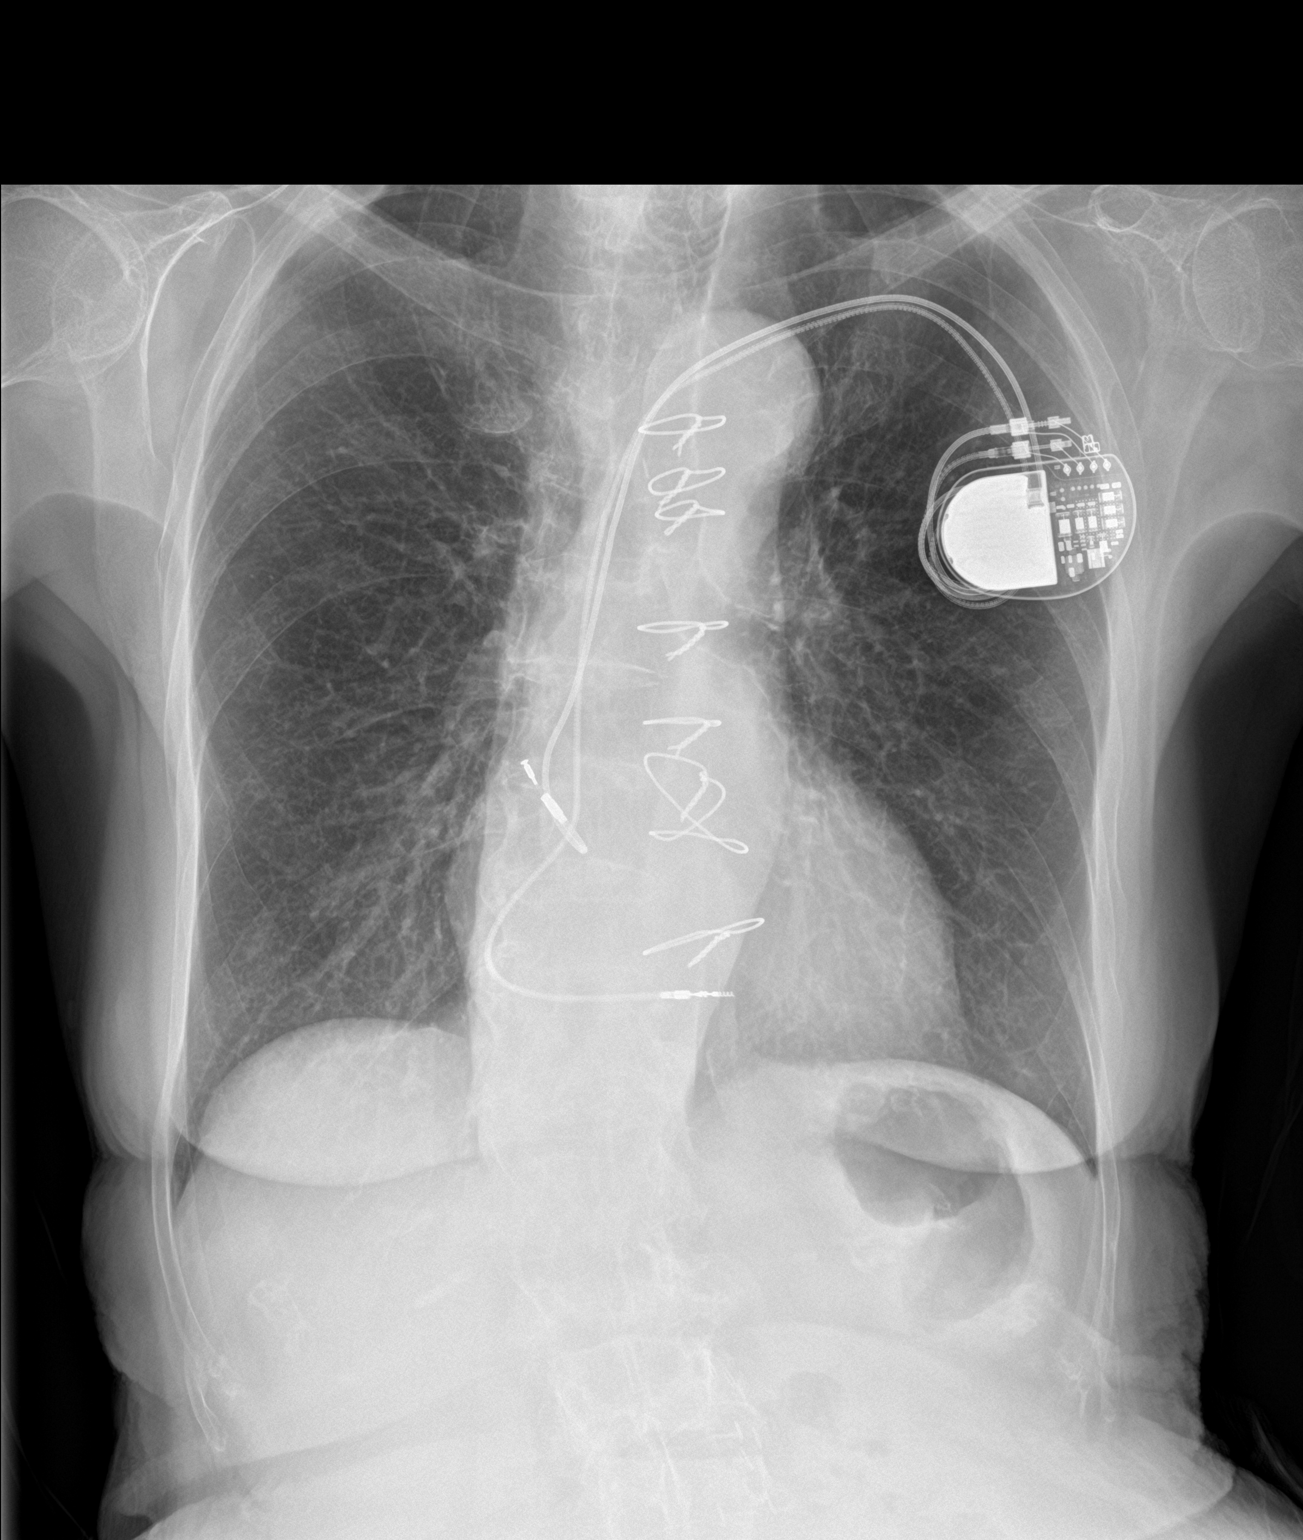

[chest lat]
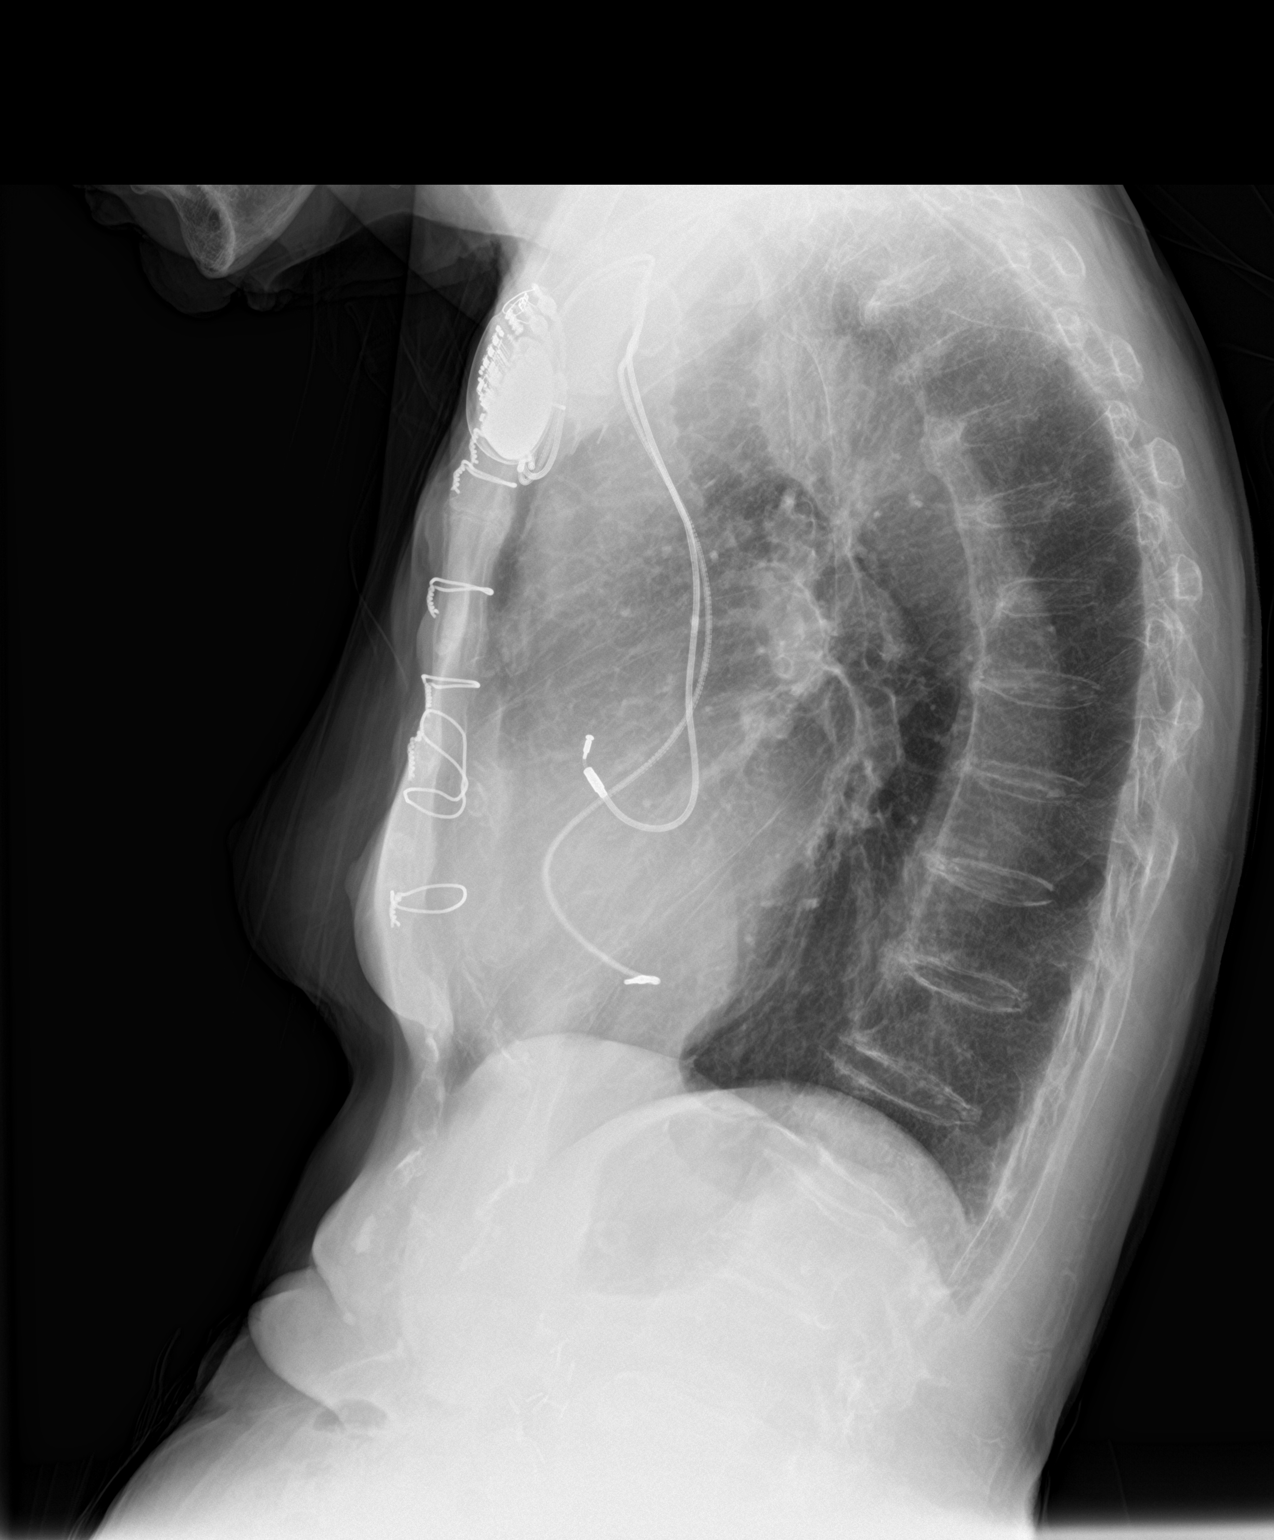

[2 of 2 positions shown; findings below may reference images not displayed]

FINDINGS: There is no edema or consolidation. There is no evident pleural
effusion. Heart is upper normal in size with pacemaker leads
attached to the right atrium and small cardiac vein. There is aortic
atherosclerosis. No adenopathy. Bones are osteoporotic.
IMPRESSION: No edema or consolidation. No pleural effusion. Aortic
atherosclerosis evident. Bones osteoporotic.

## 2017-10-25 ENCOUNTER — Ambulatory Visit: Payer: Medicare Other | Admitting: Internal Medicine

## 2017-10-25 ENCOUNTER — Encounter: Payer: Self-pay | Admitting: Internal Medicine

## 2017-10-25 ENCOUNTER — Other Ambulatory Visit (INDEPENDENT_AMBULATORY_CARE_PROVIDER_SITE_OTHER): Payer: Medicare Other

## 2017-10-25 VITALS — BP 112/72 | Temp 98.1°F | Ht 64.0 in | Wt 104.0 lb

## 2017-10-25 DIAGNOSIS — I10 Essential (primary) hypertension: Secondary | ICD-10-CM | POA: Diagnosis not present

## 2017-10-25 DIAGNOSIS — R310 Gross hematuria: Secondary | ICD-10-CM

## 2017-10-25 DIAGNOSIS — F411 Generalized anxiety disorder: Secondary | ICD-10-CM

## 2017-10-25 LAB — URINALYSIS, ROUTINE W REFLEX MICROSCOPIC
Ketones, ur: NEGATIVE
Nitrite: NEGATIVE
PH: 6.5 (ref 5.0–8.0)
SPECIFIC GRAVITY, URINE: 1.015 (ref 1.000–1.030)
TOTAL PROTEIN, URINE-UPE24: 100 — AB
URINE GLUCOSE: NEGATIVE
Urobilinogen, UA: 0.2 (ref 0.0–1.0)

## 2017-10-25 LAB — CBC WITH DIFFERENTIAL/PLATELET
BASOS ABS: 0.1 10*3/uL (ref 0.0–0.1)
BASOS PCT: 0.8 % (ref 0.0–3.0)
Eosinophils Absolute: 0.3 10*3/uL (ref 0.0–0.7)
Eosinophils Relative: 3.5 % (ref 0.0–5.0)
HEMATOCRIT: 37.5 % (ref 36.0–46.0)
Hemoglobin: 12.6 g/dL (ref 12.0–15.0)
LYMPHS ABS: 2.2 10*3/uL (ref 0.7–4.0)
LYMPHS PCT: 27.5 % (ref 12.0–46.0)
MCHC: 33.7 g/dL (ref 30.0–36.0)
MCV: 100.1 fl — AB (ref 78.0–100.0)
Monocytes Absolute: 0.8 10*3/uL (ref 0.1–1.0)
Monocytes Relative: 9.5 % (ref 3.0–12.0)
NEUTROS ABS: 4.7 10*3/uL (ref 1.4–7.7)
NEUTROS PCT: 58.7 % (ref 43.0–77.0)
PLATELETS: 216 10*3/uL (ref 150.0–400.0)
RBC: 3.74 Mil/uL — ABNORMAL LOW (ref 3.87–5.11)
RDW: 13.6 % (ref 11.5–15.5)
WBC: 8 10*3/uL (ref 4.0–10.5)

## 2017-10-25 LAB — BASIC METABOLIC PANEL
BUN: 51 mg/dL — ABNORMAL HIGH (ref 6–23)
CALCIUM: 10.4 mg/dL (ref 8.4–10.5)
CO2: 31 mEq/L (ref 19–32)
CREATININE: 1.19 mg/dL (ref 0.40–1.20)
Chloride: 106 mEq/L (ref 96–112)
GFR: 45.23 mL/min — AB (ref >60.00)
Glucose, Bld: 95 mg/dL (ref 70–99)
Potassium: 3.3 mEq/L — ABNORMAL LOW (ref 3.5–5.1)
Sodium: 144 mEq/L (ref 135–145)

## 2017-10-25 NOTE — Assessment & Plan Note (Signed)
Mild situational worsening, d/w pt, declines further need for change in tx

## 2017-10-25 NOTE — Progress Notes (Signed)
Subjective:    Patient ID: Alexis Cobb, female    DOB: 26-May-1924, 84 y.o.   MRN: [id number]  HPI  Here with c/o recurring gross hematuria "more than a few times" over past 4-5 months, not assoc with pain and Denies urinary symptoms such as dysuria, frequency, urgency, flank pain, or n/v, fever, chills. Pt denies chest pain, increased sob or doe, wheezing, orthopnea, PND, increased LE swelling, palpitations, dizziness or syncope.  Pt denies new neurological symptoms such as new headache, or facial or extremity weakness or numbness   Pt denies polydipsia, polyuria, Denies worsening depressive symptoms, suicidal ideation, or panic.  Worried about wt loss but overall stable Wt Readings from Last 3 Encounters:  10/25/17 104 lb (47.2 kg)  06/02/17 106 lb 3.2 oz (48.2 kg)  05/18/17 104 lb (47.2 kg)   Past Medical History:  Diagnosis Date  . Arthritis   . ARTHRITIS, RHEUMATOID, HX OF 01/06/2007  . Cervical stenosis of spine   . HTN (hypertension)   . Hyperlipidemia   . MITRAL REGURGITATION 01/06/2007  . OSTEOARTHRITIS 01/14/2007  . OSTEOPOROSIS 01/14/2007  . Other specified forms of hearing loss 08/01/2009  . PACEMAKER, PERMANENT 01/06/2007  . Raynaud's syndrome 01/06/2007   Past Surgical History:  Procedure Laterality Date  . ABDOMINAL HYSTERECTOMY    . APPENDECTOMY    . CATARACT EXTRACTION, BILATERAL    . MITRAL VALVE REPAIR    . ovarian tumor, benign    . PACEMAKER INSERTION    . PPM GENERATOR CHANGEOUT N/A 02/04/2017   Procedure: PPM GENERATOR CHANGEOUT;  Surgeon: Marinus Maw, MD;  Location: [facility] INVASIVE CV LAB;  Service: Cardiovascular;  Laterality: N/A;    reports that she has never smoked. She has never used smokeless tobacco. She reports that she does not drink alcohol or use drugs. family history includes Coronary artery disease in her unknown relative; Hypertension in her unknown relative. Allergies  Allergen Reactions  . Ace Inhibitors Rash  . Cephalexin Rash  .  Penicillins Rash and Other (See Comments)    Has patient had a PCN reaction causing immediate rash, facial/tongue/throat swelling, SOB or lightheadedness with hypotension: No Has patient had a PCN reaction causing severe rash involving mucus membranes or skin necrosis: No Has patient had a PCN reaction that required hospitalization No Has patient had a PCN reaction occurring within the last 10 years: No If all of the above answers are "NO", then may proceed with Cephalosporin use.  Marland Kitchen Risedronate Sodium Rash   Current Outpatient Medications on File Prior to Visit  Medication Sig Dispense Refill  . aspirin EC 81 MG tablet Take 81 mg by mouth at bedtime.    . Calcium Carbonate-Vitamin D (CALCIUM 600+D) 600-400 MG-UNIT tablet Take 1 tablet by mouth 2 (two) times daily.    . cholecalciferol (VITAMIN D) 1000 units tablet Take 1,000 Units by mouth daily.    . furosemide (LASIX) 20 MG tablet Take 20 mg by mouth daily as needed for edema.    Marland Kitchen losartan (COZAAR) 100 MG tablet Take 100 mg by mouth.     . lovastatin (MEVACOR) 40 MG tablet TAKE 1 TABLET EACH DAY. (Patient taking differently: Take 40 mg by mouth daily) 30 tablet 0  . metoprolol succinate (TOPROL-XL) 50 MG 24 hr tablet Take 75 mg by mouth daily. Take with or immediately following a meal.    . Multiple Vitamin (MULTIVITAMIN WITH MINERALS) TABS tablet Take 1 tablet by mouth daily.    . nitroGLYCERIN (NITROSTAT)  0.4 MG SL tablet Place 0.4 mg under the tongue every 5 (five) minutes as needed for chest pain.    Marland Kitchen sulfamethoxazole-trimethoprim (BACTRIM DS,SEPTRA DS) 800-160 MG tablet Take 1 tablet by mouth 2 (two) times daily. 14 tablet 0   No current facility-administered medications on file prior to visit.    Review of Systems  Constitutional: Negative for other unusual diaphoresis or sweats HENT: Negative for ear discharge or swelling Eyes: Negative for other worsening visual disturbances Respiratory: Negative for stridor or other swelling    Gastrointestinal: Negative for worsening distension or other blood Genitourinary: Negative for retention or other urinary change Musculoskeletal: Negative for other MSK pain or swelling Skin: Negative for color change or other new lesions Neurological: Negative for worsening tremors and other numbness  Psychiatric/Behavioral: Negative for worsening agitation or other fatigue All other system neg per pt    Objective:   Physical Exam BP 112/72   Temp 98.1 F (36.7 C) (Oral)   Ht 5\' 4"  (1.626 m)   Wt 104 lb (47.2 kg)   BMI 17.85 kg/m  VS noted,  Constitutional: Pt appears in NAD HENT: Head: NCAT.  Right Ear: External ear normal.  Left Ear: External ear normal.  Eyes: . Pupils are equal, round, and reactive to light. Conjunctivae and EOM are normal Nose: without d/c or deformity Neck: Neck supple. Gross normal ROM Cardiovascular: Normal rate and regular rhythm.   Pulmonary/Chest: Effort normal and breath sounds without rales or wheezing.  Abd:  Soft, NT, ND, + BS, no organomegaly Neurological: Pt is alert. At baseline orientation, motor grossly intact Skin: Skin is warm. No rashes, other new lesions, no LE edema Psychiatric: Pt behavior is normal without agitation , mild nervous No other exam findings  Lab Results  Component Value Date   WBC 6.7 01/27/2017   HGB 13.3 01/27/2017   HCT 40.1 01/27/2017   PLT 236 01/27/2017   GLUCOSE 93 01/27/2017   CHOL 200 10/19/2016   TRIG 84.0 10/19/2016   HDL 56.60 10/19/2016   LDLCALC 126 (H) 10/19/2016   ALT 8 10/19/2016   AST 21 10/19/2016   NA 149 (H) 01/27/2017   K 4.0 01/27/2017   CL 107 (H) 01/27/2017   CREATININE 1.25 (H) 01/27/2017   BUN 47 (H) 01/27/2017   CO2 26 01/27/2017   TSH 2.20 10/19/2016   INR 1.1 ratio (H) 08/29/2008       Assessment & Plan:

## 2017-10-25 NOTE — Assessment & Plan Note (Signed)
Afeb, exam benign, for urine study repeat and labs, cant r/o malignancy - for urology referral and pt is aggreable

## 2017-10-25 NOTE — Patient Instructions (Signed)
Please continue all other medications as before, and refills have been done if requested.  Please have the pharmacy call with any other refills you may need.  Please keep your appointments with your specialists as you may have planned  You will be contacted regarding the referral for: urology  Please go to the LAB in the Basement (turn left off the elevator) for the tests to be done today  You will be contacted by phone if any changes need to be made immediately.  Otherwise, you will receive a letter about your results with an explanation, but please check with MyChart first.  Please remember to sign up for MyChart if you have not done so, as this will be important to you in the future with finding out test results, communicating by private email, and scheduling acute appointments online when needed.

## 2017-10-25 NOTE — Assessment & Plan Note (Signed)
stable overall by history and exam, recent data reviewed with pt, and pt to continue medical treatment as before,  to f/u any worsening symptoms or concerns BP Readings from Last 3 Encounters:  10/25/17 112/72  06/02/17 130/68  05/18/17 124/76

## 2017-10-26 LAB — URINE CULTURE
MICRO NUMBER: 90729906
SPECIMEN QUALITY: ADEQUATE

## 2017-11-15 DIAGNOSIS — R310 Gross hematuria: Secondary | ICD-10-CM | POA: Diagnosis not present

## 2017-11-15 DIAGNOSIS — R350 Frequency of micturition: Secondary | ICD-10-CM | POA: Diagnosis not present

## 2017-11-29 DIAGNOSIS — R310 Gross hematuria: Secondary | ICD-10-CM | POA: Diagnosis not present

## 2017-11-29 DIAGNOSIS — N200 Calculus of kidney: Secondary | ICD-10-CM | POA: Diagnosis not present

## 2017-12-01 ENCOUNTER — Ambulatory Visit (INDEPENDENT_AMBULATORY_CARE_PROVIDER_SITE_OTHER): Payer: Medicare Other | Admitting: *Deleted

## 2017-12-01 DIAGNOSIS — R001 Bradycardia, unspecified: Secondary | ICD-10-CM

## 2017-12-01 NOTE — Progress Notes (Signed)
Remote pacemaker transmission.   

## 2017-12-05 DIAGNOSIS — R8271 Bacteriuria: Secondary | ICD-10-CM | POA: Diagnosis not present

## 2017-12-05 DIAGNOSIS — R350 Frequency of micturition: Secondary | ICD-10-CM | POA: Diagnosis not present

## 2017-12-05 DIAGNOSIS — R310 Gross hematuria: Secondary | ICD-10-CM | POA: Diagnosis not present

## 2017-12-06 ENCOUNTER — Telehealth: Payer: Self-pay

## 2017-12-06 NOTE — Telephone Encounter (Signed)
Called Jennifer, LVM to call back to discuss.   Copied from CRM [id number]. Topic: Inquiry >> Dec 05, 2017  5:45 PM Alexander Bergeron B wrote: Reason for CRM: [facility] Trena Platt called to state that she is needing a call from the pcp's nurse about pt's diet, contact cell: [phone] Office: [phone]

## 2018-01-09 LAB — CUP PACEART REMOTE DEVICE CHECK
Battery Voltage: 2.99 V
Brady Statistic AP VP Percent: 60.96 %
Brady Statistic AS VS Percent: 0.02 %
Implantable Lead Location: 753860
Implantable Pulse Generator Implant Date: 20180928
Lead Channel Impedance Value: 285 Ohm
Lead Channel Impedance Value: 304 Ohm
Lead Channel Impedance Value: 342 Ohm
Lead Channel Pacing Threshold Amplitude: 2.125 V
Lead Channel Pacing Threshold Pulse Width: 0.4 ms
Lead Channel Sensing Intrinsic Amplitude: 1.125 mV
Lead Channel Setting Pacing Amplitude: 1.5 V
Lead Channel Setting Pacing Pulse Width: 0.8 ms
MDC IDC LEAD IMPLANT DT: 20020104
MDC IDC LEAD IMPLANT DT: 20020104
MDC IDC LEAD LOCATION: 753859
MDC IDC MSMT BATTERY REMAINING LONGEVITY: 67 mo
MDC IDC MSMT LEADCHNL RA IMPEDANCE VALUE: 380 Ohm
MDC IDC MSMT LEADCHNL RA PACING THRESHOLD AMPLITUDE: 0.75 V
MDC IDC MSMT LEADCHNL RA PACING THRESHOLD PULSEWIDTH: 0.4 ms
MDC IDC MSMT LEADCHNL RA SENSING INTR AMPL: 1.125 mV
MDC IDC MSMT LEADCHNL RV SENSING INTR AMPL: 3.5 mV
MDC IDC SESS DTM: 20190725052408
MDC IDC SET LEADCHNL RV PACING AMPLITUDE: 3.5 V
MDC IDC SET LEADCHNL RV SENSING SENSITIVITY: 1.2 mV
MDC IDC STAT BRADY AP VS PERCENT: 0 %
MDC IDC STAT BRADY AS VP PERCENT: 39.02 %
MDC IDC STAT BRADY RA PERCENT PACED: 60.79 %
MDC IDC STAT BRADY RV PERCENT PACED: 99.98 %

## 2018-02-20 ENCOUNTER — Telehealth: Payer: Self-pay | Admitting: Internal Medicine

## 2018-02-20 NOTE — Telephone Encounter (Signed)
Verbal orders given  

## 2018-02-20 NOTE — Telephone Encounter (Signed)
Copied from CRM [id number]. Topic: Quick Communication - Home Health Verbal Orders >> Feb 20, 2018 10:51 AM Windy Kalata, NT wrote: Caller/Agency: Marchelle Folks, [facility] Number: [phone] Requesting OT/PT/Skilled Nursing/Social Work: Nursing orders Frequency: 2x a week for 6 weeks with 2 prn visits.  Also requesting PT and OT evaluation.

## 2018-02-28 ENCOUNTER — Encounter: Payer: Self-pay | Admitting: Internal Medicine

## 2018-03-02 ENCOUNTER — Ambulatory Visit (INDEPENDENT_AMBULATORY_CARE_PROVIDER_SITE_OTHER): Payer: Medicare Other | Admitting: *Deleted

## 2018-03-02 DIAGNOSIS — I495 Sick sinus syndrome: Secondary | ICD-10-CM

## 2018-03-02 NOTE — Progress Notes (Signed)
Remote pacemaker transmission.   

## 2018-03-17 ENCOUNTER — Ambulatory Visit (INDEPENDENT_AMBULATORY_CARE_PROVIDER_SITE_OTHER): Payer: Medicare Other | Admitting: Internal Medicine

## 2018-03-17 VITALS — BP 120/70 | HR 66 | Ht 64.0 in | Wt 102.0 lb

## 2018-03-17 DIAGNOSIS — Z950 Presence of cardiac pacemaker: Secondary | ICD-10-CM | POA: Diagnosis not present

## 2018-03-17 DIAGNOSIS — I495 Sick sinus syndrome: Secondary | ICD-10-CM

## 2018-03-17 DIAGNOSIS — I10 Essential (primary) hypertension: Secondary | ICD-10-CM | POA: Diagnosis not present

## 2018-03-17 NOTE — Patient Instructions (Signed)

## 2018-03-17 NOTE — Progress Notes (Signed)
HPI Alexis Cobb returns today for followup. She is a pleasant 84 yo woman with symptomatic bradycardia due to CHB. In the interim she has done well except that she has a sore on her leg which has been very slow to heal. No syncope. She has becomde increasingly sedentary. Allergies  Allergen Reactions  . Ace Inhibitors Rash  . Cephalexin Rash  . Penicillins Rash and Other (See Comments)    Has patient had a PCN reaction causing immediate rash, facial/tongue/throat swelling, SOB or lightheadedness with hypotension: No Has patient had a PCN reaction causing severe rash involving mucus membranes or skin necrosis: No Has patient had a PCN reaction that required hospitalization No Has patient had a PCN reaction occurring within the last 10 years: No If all of the above answers are "NO", then may proceed with Cephalosporin use.  Marland Kitchen Risedronate Sodium Rash     Current Outpatient Medications  Medication Sig Dispense Refill  . aspirin EC 81 MG tablet Take 81 mg by mouth at bedtime.    . Calcium Carbonate-Vitamin D (CALCIUM 600+D) 600-400 MG-UNIT tablet Take 1 tablet by mouth 2 (two) times daily.    . cholecalciferol (VITAMIN D) 1000 units tablet Take 1,000 Units by mouth daily.    . furosemide (LASIX) 20 MG tablet Take 20 mg by mouth daily as needed for edema.    Marland Kitchen losartan (COZAAR) 100 MG tablet Take 100 mg by mouth.     . lovastatin (MEVACOR) 40 MG tablet TAKE 1 TABLET EACH DAY. (Patient taking differently: Take 40 mg by mouth daily) 30 tablet 0  . metoprolol succinate (TOPROL-XL) 50 MG 24 hr tablet Take 75 mg by mouth daily. Take with or immediately following a meal.    . Multiple Vitamin (MULTIVITAMIN WITH MINERALS) TABS tablet Take 1 tablet by mouth daily.    . nitroGLYCERIN (NITROSTAT) 0.4 MG SL tablet Place 0.4 mg under the tongue every 5 (five) minutes as needed for chest pain.     No current facility-administered medications for this visit.      Past Medical History:    Diagnosis Date  . Arthritis   . ARTHRITIS, RHEUMATOID, HX OF 01/06/2007  . Cervical stenosis of spine   . HTN (hypertension)   . Hyperlipidemia   . MITRAL REGURGITATION 01/06/2007  . OSTEOARTHRITIS 01/14/2007  . OSTEOPOROSIS 01/14/2007  . Other specified forms of hearing loss 08/01/2009  . PACEMAKER, PERMANENT 01/06/2007  . Raynaud's syndrome 01/06/2007    ROS:   All systems reviewed and negative except as noted in the HPI.   Past Surgical History:  Procedure Laterality Date  . ABDOMINAL HYSTERECTOMY    . APPENDECTOMY    . CATARACT EXTRACTION, BILATERAL    . MITRAL VALVE REPAIR    . ovarian tumor, benign    . PACEMAKER INSERTION    . PPM GENERATOR CHANGEOUT N/A 02/04/2017   Procedure: PPM GENERATOR CHANGEOUT;  Surgeon: Marinus Maw, MD;  Location: [facility] INVASIVE CV LAB;  Service: Cardiovascular;  Laterality: N/A;     Family History  Problem Relation Age of Onset  . Coronary artery disease Unknown   . Hypertension Unknown      Social History   Socioeconomic History  . Marital status: Widowed    Spouse name: Not on file  . Number of children: Not on file  . Years of education: Not on file  . Highest education level: Not on file  Occupational History  . Occupation: retired Catering manager  Employer: RETIRED  Social Needs  . Financial resource strain: Not on file  . Food insecurity:    Worry: Not on file    Inability: Not on file  . Transportation needs:    Medical: Not on file    Non-medical: Not on file  Tobacco Use  . Smoking status: Never Smoker  . Smokeless tobacco: Never Used  Substance and Sexual Activity  . Alcohol use: No    Alcohol/week: 0.0 standard drinks  . Drug use: No  . Sexual activity: Not on file  Lifestyle  . Physical activity:    Days per week: Not on file    Minutes per session: Not on file  . Stress: Not on file  Relationships  . Social connections:    Talks on phone: Not on file    Gets together: Not on file    Attends religious  service: Not on file    Active member of club or organization: Not on file    Attends meetings of clubs or organizations: Not on file    Relationship status: Not on file  . Intimate partner violence:    Fear of current or ex partner: Not on file    Emotionally abused: Not on file    Physically abused: Not on file    Forced sexual activity: Not on file  Other Topics Concern  . Not on file  Social History Narrative  . Not on file     BP 120/70   Pulse 66   Ht 5\' 4"  (1.626 m)   Wt 102 lb (46.3 kg)   BMI 17.51 kg/m   Physical Exam:  Well appearing NAD HEENT: Unremarkable Neck:  No JVD, no thyromegally Lymphatics:  No adenopathy Back:  No CVA tenderness Lungs:  Clear with no wheezes HEART:  Regular rate rhythm, no murmurs, no rubs, no clicks Abd:  soft, positive bowel sounds, no organomegally, no rebound, no guarding Ext:  2 plus pulses, no edema, no cyanosis, no clubbing Skin:  No rashes no nodules Neuro:  CN II through XII intact, motor grossly intact  EKG - p synchronous ventricular pacing  DEVICE  Normal device function.  See PaceArt for details.   Assess/Plan: 1. CHB - she is asymptomatic, s/p PPM 2. PPM - her medtronic DDD PM is working normally.  3. HTN - her blood pressure is well controlled.   .D.

## 2018-03-19 ENCOUNTER — Encounter: Payer: Self-pay | Admitting: Internal Medicine

## 2018-03-21 ENCOUNTER — Encounter: Payer: Self-pay | Admitting: Internal Medicine

## 2018-03-21 ENCOUNTER — Other Ambulatory Visit (INDEPENDENT_AMBULATORY_CARE_PROVIDER_SITE_OTHER): Payer: Medicare Other

## 2018-03-21 ENCOUNTER — Ambulatory Visit: Payer: Medicare Other | Admitting: Internal Medicine

## 2018-03-21 VITALS — BP 116/66 | HR 60 | Temp 97.9°F | Ht 64.0 in | Wt 108.0 lb

## 2018-03-21 DIAGNOSIS — T148XXD Other injury of unspecified body region, subsequent encounter: Secondary | ICD-10-CM | POA: Diagnosis not present

## 2018-03-21 DIAGNOSIS — H6121 Impacted cerumen, right ear: Secondary | ICD-10-CM

## 2018-03-21 DIAGNOSIS — Z0000 Encounter for general adult medical examination without abnormal findings: Secondary | ICD-10-CM | POA: Diagnosis not present

## 2018-03-21 DIAGNOSIS — H9191 Unspecified hearing loss, right ear: Secondary | ICD-10-CM | POA: Diagnosis not present

## 2018-03-21 DIAGNOSIS — Z23 Encounter for immunization: Secondary | ICD-10-CM

## 2018-03-21 LAB — BASIC METABOLIC PANEL
BUN: 42 mg/dL — ABNORMAL HIGH (ref 6–23)
CHLORIDE: 106 meq/L (ref 96–112)
CO2: 31 meq/L (ref 19–32)
Calcium: 10.7 mg/dL — ABNORMAL HIGH (ref 8.4–10.5)
Creatinine, Ser: 1.02 mg/dL (ref 0.40–1.20)
GFR: 53.99 mL/min — ABNORMAL LOW (ref >60.00)
GLUCOSE: 95 mg/dL (ref 70–99)
POTASSIUM: 3.4 meq/L — AB (ref 3.5–5.1)
SODIUM: 145 meq/L (ref 135–145)

## 2018-03-21 LAB — URINALYSIS, ROUTINE W REFLEX MICROSCOPIC
Bilirubin Urine: NEGATIVE
KETONES UR: NEGATIVE
Nitrite: NEGATIVE
RBC / HPF: NONE SEEN (ref 0–?)
Specific Gravity, Urine: 1.01 (ref 1.000–1.030)
Total Protein, Urine: NEGATIVE
URINE GLUCOSE: NEGATIVE
Urobilinogen, UA: 0.2 (ref 0.0–1.0)
pH: 6.5 (ref 5.0–8.0)

## 2018-03-21 LAB — CBC WITH DIFFERENTIAL/PLATELET
BASOS PCT: 1.2 % (ref 0.0–3.0)
Basophils Absolute: 0.1 10*3/uL (ref 0.0–0.1)
Eosinophils Absolute: 0.5 10*3/uL (ref 0.0–0.7)
Eosinophils Relative: 5.5 % — ABNORMAL HIGH (ref 0.0–5.0)
HCT: 38.2 % (ref 36.0–46.0)
Hemoglobin: 12.8 g/dL (ref 12.0–15.0)
LYMPHS ABS: 2.2 10*3/uL (ref 0.7–4.0)
Lymphocytes Relative: 27.1 % (ref 12.0–46.0)
MCHC: 33.6 g/dL (ref 30.0–36.0)
MCV: 99.3 fl (ref 78.0–100.0)
Monocytes Absolute: 0.8 10*3/uL (ref 0.1–1.0)
Monocytes Relative: 9.9 % (ref 3.0–12.0)
NEUTROS PCT: 56.3 % (ref 43.0–77.0)
Neutro Abs: 4.6 10*3/uL (ref 1.4–7.7)
Platelets: 226 10*3/uL (ref 150.0–400.0)
RBC: 3.85 Mil/uL — ABNORMAL LOW (ref 3.87–5.11)
RDW: 13.8 % (ref 11.5–15.5)
WBC: 8.2 10*3/uL (ref 4.0–10.5)

## 2018-03-21 LAB — LIPID PANEL
CHOL/HDL RATIO: 3
Cholesterol: 133 mg/dL (ref 0–200)
HDL: 50.5 mg/dL (ref >39.00)
LDL CALC: 61 mg/dL (ref 0–99)
NonHDL: 82.78
Triglycerides: 111 mg/dL (ref 0.0–149.0)
VLDL: 22.2 mg/dL (ref 0.0–40.0)

## 2018-03-21 LAB — HEPATIC FUNCTION PANEL
ALT: 11 U/L (ref 0–35)
AST: 22 U/L (ref 0–37)
Albumin: 4.1 g/dL (ref 3.5–5.2)
Alkaline Phosphatase: 52 U/L (ref 39–117)
BILIRUBIN TOTAL: 0.6 mg/dL (ref 0.2–1.2)
Bilirubin, Direct: 0.1 mg/dL (ref 0.0–0.3)
Total Protein: 7.1 g/dL (ref 6.0–8.3)

## 2018-03-21 LAB — TSH: TSH: 1.74 u[IU]/mL (ref 0.35–4.50)

## 2018-03-21 NOTE — Patient Instructions (Signed)
Your right ear was irrigated of wax today  Please continue all other medications as before, and refills have been done if requested.  Please have the pharmacy call with any other refills you may need.  Please continue your efforts at being more active, low cholesterol diet, and weight control.  You are otherwise up to date with prevention measures today.  Please keep your appointments with your specialists as you may have planned  Please go to the LAB in the Basement (turn left off the elevator) for the tests to be done today  You will be contacted by phone if any changes need to be made immediately.  Otherwise, you will receive a letter about your results with an explanation, but please check with MyChart first.  Please remember to sign up for MyChart if you have not done so, as this will be important to you in the future with finding out test results, communicating by private email, and scheduling acute appointments online when needed.  Please return in 1 year for your yearly visit, or sooner if needed, with Lab testing done 3-5 days before  

## 2018-03-21 NOTE — Assessment & Plan Note (Signed)
To cont wound care with HH involvement, improving

## 2018-03-21 NOTE — Assessment & Plan Note (Signed)

## 2018-03-21 NOTE — Assessment & Plan Note (Addendum)
Improved with irrigation wax impaction 

## 2018-03-21 NOTE — Progress Notes (Signed)
Subjective:    Patient ID: Alexis Cobb, female    DOB: 1924/09/28, 84 y.o.   MRN: [id number]  HPI  Here for wellness and f/u;  Overall doing ok;  Pt denies Chest pain, worsening SOB, DOE, wheezing, orthopnea, PND, worsening LE edema, palpitations, dizziness or syncope.  Pt denies neurological change such as new headache, facial or extremity weakness.  Pt denies polydipsia, polyuria, or low sugar symptoms. Pt states overall good compliance with treatment and medications, good tolerability, and has been trying to follow appropriate diet.  Pt denies worsening depressive symptoms, suicidal ideation or panic. No fever, night sweats, wt loss, loss of appetite, or other constitutional symptoms.  Pt states good ability with ADL's, has low to mod  fall risk, home safety reviewed and adequate, no other significant changes in hearing or vision, and only occasionally active with exercise.  Has had some left foot pain assoc with wound that has been improving with an MD at the facility, just had dressing changed today.  Walks with walker - no recent falls.  Has some bilat leg edema better at night, comes back the next day.    Past Medical History:  Diagnosis Date  . Arthritis   . ARTHRITIS, RHEUMATOID, HX OF 01/06/2007  . Cervical stenosis of spine   . HTN (hypertension)   . Hyperlipidemia   . MITRAL REGURGITATION 01/06/2007  . OSTEOARTHRITIS 01/14/2007  . OSTEOPOROSIS 01/14/2007  . Other specified forms of hearing loss 08/01/2009  . PACEMAKER, PERMANENT 01/06/2007  . Raynaud's syndrome 01/06/2007   Past Surgical History:  Procedure Laterality Date  . ABDOMINAL HYSTERECTOMY    . APPENDECTOMY    . CATARACT EXTRACTION, BILATERAL    . MITRAL VALVE REPAIR    . ovarian tumor, benign    . PACEMAKER INSERTION    . PPM GENERATOR CHANGEOUT N/A 02/04/2017   Procedure: PPM GENERATOR CHANGEOUT;  Surgeon: Marinus Maw, MD;  Location: [facility] INVASIVE CV LAB;  Service: Cardiovascular;  Laterality: N/A;    reports  that she has never smoked. She has never used smokeless tobacco. She reports that she does not drink alcohol or use drugs. family history includes Coronary artery disease in her unknown relative; Hypertension in her unknown relative. Allergies  Allergen Reactions  . Ace Inhibitors Rash  . Cephalexin Rash  . Penicillins Rash and Other (See Comments)    Has patient had a PCN reaction causing immediate rash, facial/tongue/throat swelling, SOB or lightheadedness with hypotension: No Has patient had a PCN reaction causing severe rash involving mucus membranes or skin necrosis: No Has patient had a PCN reaction that required hospitalization No Has patient had a PCN reaction occurring within the last 10 years: No If all of the above answers are "NO", then may proceed with Cephalosporin use.  Marland Kitchen Risedronate Sodium Rash   Current Outpatient Medications on File Prior to Visit  Medication Sig Dispense Refill  . aspirin EC 81 MG tablet Take 81 mg by mouth at bedtime.    . Calcium Carbonate-Vitamin D (CALCIUM 600+D) 600-400 MG-UNIT tablet Take 1 tablet by mouth 2 (two) times daily.    . cholecalciferol (VITAMIN D) 1000 units tablet Take 1,000 Units by mouth daily.    . furosemide (LASIX) 20 MG tablet Take 20 mg by mouth daily as needed for edema.    Marland Kitchen losartan (COZAAR) 100 MG tablet Take 100 mg by mouth.     . lovastatin (MEVACOR) 40 MG tablet TAKE 1 TABLET EACH DAY. (Patient taking differently:  Take 40 mg by mouth daily) 30 tablet 0  . metoprolol succinate (TOPROL-XL) 50 MG 24 hr tablet Take 75 mg by mouth daily. Take with or immediately following a meal.    . Multiple Vitamin (MULTIVITAMIN WITH MINERALS) TABS tablet Take 1 tablet by mouth daily.    . nitroGLYCERIN (NITROSTAT) 0.4 MG SL tablet Place 0.4 mg under the tongue every 5 (five) minutes as needed for chest pain.     No current facility-administered medications on file prior to visit.    Review of Systems  Constitutional: Negative for other  unusual diaphoresis or sweats HENT: Negative for ear discharge or swelling Eyes: Negative for other worsening visual disturbances Respiratory: Negative for stridor or other swelling  Gastrointestinal: Negative for worsening distension or other blood Genitourinary: Negative for retention or other urinary change Musculoskeletal: Negative for other MSK pain or swelling Skin: Negative for color change or other new lesions Neurological: Negative for worsening tremors and other numbness  Psychiatric/Behavioral: Negative for worsening agitation or other fatigue All other system neg per pt    Objective:   Physical Exam BP 116/66   Pulse 60   Temp 97.9 F (36.6 C) (Oral)   Ht 5\' 4"  (1.626 m)   Wt 108 lb (49 kg)   SpO2 98%   BMI 18.54 kg/m  VS noted,  Constitutional: Pt is oriented to person, place, and time. Appears well-developed and well-nourished, in no significant distress and comfortable Head: Normocephalic and atraumatic  Eyes: Conjunctivae and EOM are normal. Pupils are equal, round, and reactive to light Right Ear: External ear normal without discharge Left Ear: External ear normal without discharge Nose: Nose without discharge or deformity Mouth/Throat: Oropharynx is without other ulcerations and moist  Neck: Normal range of motion. Neck supple. No JVD present. No tracheal deviation present or significant neck LA or mass Cardiovascular: Normal rate, regular rhythm, normal heart sounds and intact distal pulses.   Pulmonary/Chest: WOB normal and breath sounds without rales or wheezing  Abdominal: Soft. Bowel sounds are normal. NT. No HSM  Musculoskeletal: Normal range of motion. Exhibits no edema Lymphadenopathy: Has no other cervical adenopathy.  Neurological: Pt is alert and oriented to person, place, and time. Pt has normal reflexes. No cranial nerve deficit. Motor grossly intact, Gait intact Skin: Skin is warm and dry. No rash noted or new ulcerations Psychiatric:  Has normal  mood and affect. Behavior is normal without agitation No other exam findings Lab Results  Component Value Date   WBC 8.2 03/21/2018   HGB 12.8 03/21/2018   HCT 38.2 03/21/2018   PLT 226.0 03/21/2018   GLUCOSE 95 03/21/2018   CHOL 133 03/21/2018   TRIG 111.0 03/21/2018   HDL 50.50 03/21/2018   LDLCALC 61 03/21/2018   ALT 11 03/21/2018   AST 22 03/21/2018   NA 145 03/21/2018   K 3.4 (L) 03/21/2018   CL 106 03/21/2018   CREATININE 1.02 03/21/2018   BUN 42 (H) 03/21/2018   CO2 31 03/21/2018   TSH 1.74 03/21/2018   INR 1.1 ratio (H) 08/29/2008       Assessment & Plan:

## 2018-03-22 LAB — CUP PACEART INCLINIC DEVICE CHECK
Brady Statistic AP VP Percent: 61.16 %
Brady Statistic AS VP Percent: 38.81 %
Brady Statistic AS VS Percent: 0.02 %
Brady Statistic RA Percent Paced: 61.01 %
Brady Statistic RV Percent Paced: 99.98 %
Implantable Lead Implant Date: 20020104
Implantable Lead Location: 753859
Implantable Lead Location: 753860
Implantable Pulse Generator Implant Date: 20180928
Lead Channel Impedance Value: 342 Ohm
Lead Channel Pacing Threshold Amplitude: 0.625 V
Lead Channel Pacing Threshold Amplitude: 1.75 V
Lead Channel Pacing Threshold Pulse Width: 0.4 ms
Lead Channel Sensing Intrinsic Amplitude: 4.625 mV
Lead Channel Setting Pacing Amplitude: 1.5 V
Lead Channel Setting Pacing Amplitude: 3.5 V
Lead Channel Setting Pacing Pulse Width: 0.6 ms
Lead Channel Setting Sensing Sensitivity: 0.9 mV
MDC IDC LEAD IMPLANT DT: 20020104
MDC IDC MSMT BATTERY REMAINING LONGEVITY: 74 mo
MDC IDC MSMT BATTERY VOLTAGE: 2.98 V
MDC IDC MSMT LEADCHNL RA IMPEDANCE VALUE: 323 Ohm
MDC IDC MSMT LEADCHNL RA IMPEDANCE VALUE: 380 Ohm
MDC IDC MSMT LEADCHNL RA SENSING INTR AMPL: 1 mV
MDC IDC MSMT LEADCHNL RA SENSING INTR AMPL: 1.375 mV
MDC IDC MSMT LEADCHNL RV IMPEDANCE VALUE: 285 Ohm
MDC IDC MSMT LEADCHNL RV PACING THRESHOLD PULSEWIDTH: 0.4 ms
MDC IDC SESS DTM: 20191108195050
MDC IDC STAT BRADY AP VS PERCENT: 0 %

## 2018-03-24 LAB — CUP PACEART REMOTE DEVICE CHECK
Battery Remaining Longevity: 66 mo
Battery Voltage: 2.98 V
Brady Statistic AS VP Percent: 35.45 %
Date Time Interrogation Session: 20191024111621
Implantable Lead Implant Date: 20020104
Lead Channel Impedance Value: 304 Ohm
Lead Channel Impedance Value: 323 Ohm
Lead Channel Impedance Value: 380 Ohm
Lead Channel Pacing Threshold Amplitude: 0.625 V
Lead Channel Pacing Threshold Pulse Width: 0.4 ms
Lead Channel Sensing Intrinsic Amplitude: 1.25 mV
Lead Channel Sensing Intrinsic Amplitude: 1.25 mV
MDC IDC LEAD IMPLANT DT: 20020104
MDC IDC LEAD LOCATION: 753859
MDC IDC LEAD LOCATION: 753860
MDC IDC MSMT LEADCHNL RA PACING THRESHOLD PULSEWIDTH: 0.4 ms
MDC IDC MSMT LEADCHNL RV IMPEDANCE VALUE: 361 Ohm
MDC IDC MSMT LEADCHNL RV PACING THRESHOLD AMPLITUDE: 1.75 V
MDC IDC MSMT LEADCHNL RV SENSING INTR AMPL: 3.5 mV
MDC IDC PG IMPLANT DT: 20180928
MDC IDC SET LEADCHNL RA PACING AMPLITUDE: 1.5 V
MDC IDC SET LEADCHNL RV PACING AMPLITUDE: 3.5 V
MDC IDC SET LEADCHNL RV PACING PULSEWIDTH: 0.8 ms
MDC IDC SET LEADCHNL RV SENSING SENSITIVITY: 1.2 mV
MDC IDC STAT BRADY AP VP PERCENT: 64.53 %
MDC IDC STAT BRADY AP VS PERCENT: 0 %
MDC IDC STAT BRADY AS VS PERCENT: 0.02 %
MDC IDC STAT BRADY RA PERCENT PACED: 64.38 %
MDC IDC STAT BRADY RV PERCENT PACED: 99.98 %

## 2018-03-30 ENCOUNTER — Telehealth: Payer: Self-pay | Admitting: Internal Medicine

## 2018-03-30 NOTE — Telephone Encounter (Signed)
Verbal orders given via VM 

## 2018-03-30 NOTE — Telephone Encounter (Signed)
Copied from CRM [id number]. Topic: Quick Communication - Home Health Verbal Orders >> Mar 30, 2018  3:18 PM Stephannie Li, [place] wrote: Caller/Agency: April / [facility]   Callback Number: [phone] ok to leave message   Requesting /Skilled Nursing/  Frequency:  2 week 2                     1 week  1

## 2018-04-10 ENCOUNTER — Other Ambulatory Visit: Payer: Self-pay

## 2018-04-10 MED ORDER — LOSARTAN POTASSIUM 100 MG PO TABS: 100.0000 mg | ORAL_TABLET | Freq: Every day | ORAL | 1 refills | Status: DC

## 2018-04-11 ENCOUNTER — Telehealth: Payer: Self-pay | Admitting: Internal Medicine

## 2018-04-11 NOTE — Telephone Encounter (Signed)
Copied from CRM [id number]. Topic: Quick Communication - Home Health Verbal Orders >> Apr 11, 2018 10:07 AM Arlyss Gandy, [place] wrote: Caller/Agency: April, [facility] Number: [phone] Requesting OT/PT/Skilled Nursing/Social Work: Nursing Frequency: Change order for wound care to allgenate foam dressing change 2 times weekly.

## 2018-04-11 NOTE — Telephone Encounter (Signed)
Verbal orders given  

## 2018-04-25 DIAGNOSIS — E785 Hyperlipidemia, unspecified: Secondary | ICD-10-CM | POA: Diagnosis not present

## 2018-04-25 DIAGNOSIS — M1991 Primary osteoarthritis, unspecified site: Secondary | ICD-10-CM | POA: Diagnosis not present

## 2018-04-25 DIAGNOSIS — M4802 Spinal stenosis, cervical region: Secondary | ICD-10-CM | POA: Diagnosis not present

## 2018-04-25 DIAGNOSIS — S90922D Unspecified superficial injury of left foot, subsequent encounter: Secondary | ICD-10-CM | POA: Diagnosis not present

## 2018-04-25 DIAGNOSIS — Z9181 History of falling: Secondary | ICD-10-CM | POA: Diagnosis not present

## 2018-04-25 DIAGNOSIS — M810 Age-related osteoporosis without current pathological fracture: Secondary | ICD-10-CM | POA: Diagnosis not present

## 2018-04-25 DIAGNOSIS — Z950 Presence of cardiac pacemaker: Secondary | ICD-10-CM | POA: Diagnosis not present

## 2018-04-25 DIAGNOSIS — Z86711 Personal history of pulmonary embolism: Secondary | ICD-10-CM | POA: Diagnosis not present

## 2018-04-25 DIAGNOSIS — F411 Generalized anxiety disorder: Secondary | ICD-10-CM | POA: Diagnosis not present

## 2018-04-25 DIAGNOSIS — I7300 Raynaud's syndrome without gangrene: Secondary | ICD-10-CM | POA: Diagnosis not present

## 2018-04-25 DIAGNOSIS — I10 Essential (primary) hypertension: Secondary | ICD-10-CM | POA: Diagnosis not present

## 2018-04-25 DIAGNOSIS — H918X9 Other specified hearing loss, unspecified ear: Secondary | ICD-10-CM | POA: Diagnosis not present

## 2018-04-25 DIAGNOSIS — M069 Rheumatoid arthritis, unspecified: Secondary | ICD-10-CM | POA: Diagnosis not present

## 2018-04-28 ENCOUNTER — Ambulatory Visit: Payer: Self-pay

## 2018-04-28 ENCOUNTER — Encounter: Payer: Self-pay | Admitting: Internal Medicine

## 2018-04-28 NOTE — Telephone Encounter (Signed)
Dee at [facility] is calling to requests an antibiotic be prescribed for wound to right foot. Also, pt. Needs pain medication. Reports home health nurse collected a culture today. Please fax orders to their office : [phone]. Contact number for [facility] [phone]. Answer Assessment - Initial Assessment Questions 1. APPEARANCE of SORES: "What do the sores look like?"     One wound to right top foot 2. NUMBER: "How many sores are there?"     One 3. SIZE: "How big is the largest sore?"     1.5 x .03 cm 4. LOCATION: "Where are the sores located?"     Right foot 5. ONSET: "When did the sores begin?"     Started 02/21/18 6. CAUSE: "What do you think is causing the sores?"     Pressure from shoe 7. OTHER SYMPTOMS: "Do you have any other symptoms?" (e.g., fever, new weakness)     Pain, redness and drainage - serous. Temp 98.8  Protocols used: SORES-A-AH

## 2018-04-28 NOTE — Telephone Encounter (Signed)
Sorry but pt would need OV for this; ok to consider OV at Sat clinic in the am if needed right away, or UC or ED

## 2018-04-29 ENCOUNTER — Ambulatory Visit: Payer: Medicare Other | Admitting: Family Medicine

## 2018-04-29 ENCOUNTER — Encounter: Payer: Self-pay | Admitting: Family Medicine

## 2018-04-29 DIAGNOSIS — T148XXD Other injury of unspecified body region, subsequent encounter: Secondary | ICD-10-CM

## 2018-04-29 MED ORDER — DOXYCYCLINE HYCLATE 100 MG PO TABS: 100.0000 mg | ORAL_TABLET | Freq: Two times a day (BID) | ORAL | 0 refills | Status: DC

## 2018-04-29 NOTE — Assessment & Plan Note (Signed)
She is having pain and increasing redness and weeping from this area.  Reports home health has been coming and change dressing.  There is a foul odor associated with it today. -Change the dressing today -Provided doxycycline. -May need to be seen at the wound care clinic for ongoing evaluation if symptoms do not seem to be improving with home health wound care.

## 2018-04-29 NOTE — Patient Instructions (Signed)
Nice to meet you  Please try the antibiotics  Please follow up with Dr. Jonny Ruiz if your symptoms don't improve  Happy Holidays

## 2018-04-29 NOTE — Progress Notes (Signed)
Alexis Cobb - 84 y.o. female MRN [id number]  Date of birth: [birth year]  SUBJECTIVE:  Including CC & ROS.  No chief complaint on file.   Alexis Cobb is a 84 y.o. female that is presenting with a wound on her left foot.  She has been dealing with this for 2 months.  She had this occur after she was on bedrest and had booties on she has had a wound over the lateral aspect of her foot and lateral malleolus.  She noticed some improvement with this with initial wound care but it seems of gotten worse recently.  She is having pain worse at night, redness, and worsening discharge.  She is having home health wound care currently.    Review of Systems  Constitutional: Negative for fever.  HENT: Negative for congestion.   Respiratory: Negative for cough.   Cardiovascular: Negative for chest pain.  Gastrointestinal: Negative for abdominal pain.  Musculoskeletal: Positive for gait problem.  Skin: Positive for wound.  Hematological: Negative for adenopathy.  Psychiatric/Behavioral: Negative for agitation.    HISTORY: Past Medical, Surgical, Social, and Family History Reviewed & Updated per EMR.   Pertinent Historical Findings include:  Past Medical History:  Diagnosis Date  . Arthritis   . ARTHRITIS, RHEUMATOID, HX OF 01/06/2007  . Cervical stenosis of spine   . HTN (hypertension)   . Hyperlipidemia   . MITRAL REGURGITATION 01/06/2007  . OSTEOARTHRITIS 01/14/2007  . OSTEOPOROSIS 01/14/2007  . Other specified forms of hearing loss 08/01/2009  . PACEMAKER, PERMANENT 01/06/2007  . Raynaud's syndrome 01/06/2007    Past Surgical History:  Procedure Laterality Date  . ABDOMINAL HYSTERECTOMY    . APPENDECTOMY    . CATARACT EXTRACTION, BILATERAL    . MITRAL VALVE REPAIR    . ovarian tumor, benign    . PACEMAKER INSERTION    . PPM GENERATOR CHANGEOUT N/A 02/04/2017   Procedure: PPM GENERATOR CHANGEOUT;  Surgeon: Marinus Maw, MD;  Location: [facility] INVASIVE CV LAB;  Service:  Cardiovascular;  Laterality: N/A;    Allergies  Allergen Reactions  . Ace Inhibitors Rash  . Cephalexin Rash  . Penicillins Rash and Other (See Comments)    Has patient had a PCN reaction causing immediate rash, facial/tongue/throat swelling, SOB or lightheadedness with hypotension: No Has patient had a PCN reaction causing severe rash involving mucus membranes or skin necrosis: No Has patient had a PCN reaction that required hospitalization No Has patient had a PCN reaction occurring within the last 10 years: No If all of the above answers are "NO", then may proceed with Cephalosporin use.  Marland Kitchen Risedronate Sodium Rash    Family History  Problem Relation Age of Onset  . Coronary artery disease Unknown   . Hypertension Unknown      Social History   Socioeconomic History  . Marital status: Widowed    Spouse name: Not on file  . Number of children: Not on file  . Years of education: Not on file  . Highest education level: Not on file  Occupational History  . Occupation: retired Printmaker: RETIRED  Social Needs  . Financial resource strain: Not on file  . Food insecurity:    Worry: Not on file    Inability: Not on file  . Transportation needs:    Medical: Not on file    Non-medical: Not on file  Tobacco Use  . Smoking status: Never Smoker  . Smokeless tobacco: Never Used  Substance and Sexual  Activity  . Alcohol use: No    Alcohol/week: 0.0 standard drinks  . Drug use: No  . Sexual activity: Not on file  Lifestyle  . Physical activity:    Days per week: Not on file    Minutes per session: Not on file  . Stress: Not on file  Relationships  . Social connections:    Talks on phone: Not on file    Gets together: Not on file    Attends religious service: Not on file    Active member of club or organization: Not on file    Attends meetings of clubs or organizations: Not on file    Relationship status: Not on file  . Intimate partner violence:    Fear  of current or ex partner: Not on file    Emotionally abused: Not on file    Physically abused: Not on file    Forced sexual activity: Not on file  Other Topics Concern  . Not on file  Social History Narrative  . Not on file     PHYSICAL EXAM:  VS: BP 114/62   Pulse 64   Temp (!) 97.5 F (36.4 C) (Oral)   Resp 16   Wt 106 lb (48.1 kg)   SpO2 98%   BMI 18.19 kg/m  Physical Exam Gen: NAD, alert, cooperative with exam,  ENT: normal lips, normal nasal mucosa,  Eye: normal EOM, normal conjunctiva and lids CV:  no edema, +2 pedal pulses   Resp: no accessory muscle use, non-labored,  Skin: Left foot and ankle have some chronic changes and chronic healing wounds, appears to be a bit macerated and weeping.  Has some tenderness to touch in this area.  No streaking involved Neuro: normal tone, normal sensation to touch Psych:  normal insight, alert and oriented MSK: Walking with rolling walker         ASSESSMENT & PLAN:   Wound healing, delayed She is having pain and increasing redness and weeping from this area.  Reports home health has been coming and change dressing.  There is a foul odor associated with it today. -Change the dressing today -Provided doxycycline. -May need to be seen at the wound care clinic for ongoing evaluation if symptoms do not seem to be improving with home health wound care.

## 2018-05-09 ENCOUNTER — Encounter (HOSPITAL_BASED_OUTPATIENT_CLINIC_OR_DEPARTMENT_OTHER): Payer: Medicare Other | Attending: Internal Medicine

## 2018-05-09 DIAGNOSIS — Z950 Presence of cardiac pacemaker: Secondary | ICD-10-CM | POA: Diagnosis not present

## 2018-05-09 DIAGNOSIS — L97821 Non-pressure chronic ulcer of other part of left lower leg limited to breakdown of skin: Secondary | ICD-10-CM | POA: Insufficient documentation

## 2018-05-09 DIAGNOSIS — I87322 Chronic venous hypertension (idiopathic) with inflammation of left lower extremity: Secondary | ICD-10-CM | POA: Insufficient documentation

## 2018-05-09 DIAGNOSIS — L97321 Non-pressure chronic ulcer of left ankle limited to breakdown of skin: Secondary | ICD-10-CM | POA: Diagnosis not present

## 2018-05-09 DIAGNOSIS — I10 Essential (primary) hypertension: Secondary | ICD-10-CM | POA: Insufficient documentation

## 2018-05-09 DIAGNOSIS — I87332 Chronic venous hypertension (idiopathic) with ulcer and inflammation of left lower extremity: Secondary | ICD-10-CM | POA: Diagnosis not present

## 2018-05-09 DIAGNOSIS — L97329 Non-pressure chronic ulcer of left ankle with unspecified severity: Secondary | ICD-10-CM | POA: Diagnosis not present

## 2018-05-09 DIAGNOSIS — L97229 Non-pressure chronic ulcer of left calf with unspecified severity: Secondary | ICD-10-CM | POA: Diagnosis not present

## 2018-05-09 DIAGNOSIS — L97822 Non-pressure chronic ulcer of other part of left lower leg with fat layer exposed: Secondary | ICD-10-CM | POA: Diagnosis not present

## 2018-05-10 DIAGNOSIS — M1991 Primary osteoarthritis, unspecified site: Secondary | ICD-10-CM

## 2018-05-10 DIAGNOSIS — I7300 Raynaud's syndrome without gangrene: Secondary | ICD-10-CM

## 2018-05-10 DIAGNOSIS — Z86711 Personal history of pulmonary embolism: Secondary | ICD-10-CM

## 2018-05-10 DIAGNOSIS — Z950 Presence of cardiac pacemaker: Secondary | ICD-10-CM

## 2018-05-10 DIAGNOSIS — M4802 Spinal stenosis, cervical region: Secondary | ICD-10-CM | POA: Diagnosis not present

## 2018-05-10 DIAGNOSIS — Z9181 History of falling: Secondary | ICD-10-CM

## 2018-05-10 DIAGNOSIS — H918X9 Other specified hearing loss, unspecified ear: Secondary | ICD-10-CM

## 2018-05-10 DIAGNOSIS — E785 Hyperlipidemia, unspecified: Secondary | ICD-10-CM

## 2018-05-10 DIAGNOSIS — S90922D Unspecified superficial injury of left foot, subsequent encounter: Secondary | ICD-10-CM | POA: Diagnosis not present

## 2018-05-10 DIAGNOSIS — I10 Essential (primary) hypertension: Secondary | ICD-10-CM | POA: Diagnosis not present

## 2018-05-10 DIAGNOSIS — M810 Age-related osteoporosis without current pathological fracture: Secondary | ICD-10-CM

## 2018-05-10 DIAGNOSIS — M069 Rheumatoid arthritis, unspecified: Secondary | ICD-10-CM | POA: Diagnosis not present

## 2018-05-10 DIAGNOSIS — F411 Generalized anxiety disorder: Secondary | ICD-10-CM

## 2018-05-16 ENCOUNTER — Encounter (HOSPITAL_BASED_OUTPATIENT_CLINIC_OR_DEPARTMENT_OTHER): Payer: Medicare Other | Attending: Internal Medicine

## 2018-05-16 DIAGNOSIS — L97222 Non-pressure chronic ulcer of left calf with fat layer exposed: Secondary | ICD-10-CM | POA: Diagnosis not present

## 2018-05-16 DIAGNOSIS — I87332 Chronic venous hypertension (idiopathic) with ulcer and inflammation of left lower extremity: Secondary | ICD-10-CM | POA: Insufficient documentation

## 2018-05-16 DIAGNOSIS — L97822 Non-pressure chronic ulcer of other part of left lower leg with fat layer exposed: Secondary | ICD-10-CM | POA: Diagnosis not present

## 2018-05-16 DIAGNOSIS — I10 Essential (primary) hypertension: Secondary | ICD-10-CM | POA: Diagnosis not present

## 2018-05-16 DIAGNOSIS — I87312 Chronic venous hypertension (idiopathic) with ulcer of left lower extremity: Secondary | ICD-10-CM | POA: Diagnosis not present

## 2018-05-23 DIAGNOSIS — I87332 Chronic venous hypertension (idiopathic) with ulcer and inflammation of left lower extremity: Secondary | ICD-10-CM | POA: Diagnosis not present

## 2018-05-23 DIAGNOSIS — S91002A Unspecified open wound, left ankle, initial encounter: Secondary | ICD-10-CM | POA: Diagnosis not present

## 2018-05-23 DIAGNOSIS — L97222 Non-pressure chronic ulcer of left calf with fat layer exposed: Secondary | ICD-10-CM | POA: Diagnosis not present

## 2018-05-23 DIAGNOSIS — L97822 Non-pressure chronic ulcer of other part of left lower leg with fat layer exposed: Secondary | ICD-10-CM | POA: Diagnosis not present

## 2018-05-23 DIAGNOSIS — L97322 Non-pressure chronic ulcer of left ankle with fat layer exposed: Secondary | ICD-10-CM | POA: Diagnosis not present

## 2018-05-23 DIAGNOSIS — I10 Essential (primary) hypertension: Secondary | ICD-10-CM | POA: Diagnosis not present

## 2018-06-01 ENCOUNTER — Ambulatory Visit (INDEPENDENT_AMBULATORY_CARE_PROVIDER_SITE_OTHER): Payer: Medicare Other

## 2018-06-01 DIAGNOSIS — I495 Sick sinus syndrome: Secondary | ICD-10-CM

## 2018-06-02 ENCOUNTER — Encounter: Payer: Self-pay | Admitting: Cardiology

## 2018-06-02 NOTE — Progress Notes (Signed)
Remote pacemaker transmission.   

## 2018-06-04 LAB — CUP PACEART REMOTE DEVICE CHECK
Battery Remaining Longevity: 86 mo
Battery Voltage: 2.99 V
Brady Statistic AS VS Percent: 0.13 %
Brady Statistic RA Percent Paced: 55.33 %
Brady Statistic RV Percent Paced: 99.86 %
Implantable Lead Implant Date: 20020104
Implantable Lead Implant Date: 20020104
Implantable Lead Location: 753859
Implantable Lead Location: 753860
Implantable Lead Model: 5076
Implantable Pulse Generator Implant Date: 20180928
Lead Channel Impedance Value: 342 Ohm
Lead Channel Pacing Threshold Amplitude: 0.75 V
Lead Channel Pacing Threshold Amplitude: 2 V
Lead Channel Pacing Threshold Pulse Width: 0.4 ms
Lead Channel Sensing Intrinsic Amplitude: 1 mV
Lead Channel Sensing Intrinsic Amplitude: 1 mV
Lead Channel Sensing Intrinsic Amplitude: 4 mV
Lead Channel Setting Pacing Pulse Width: 0.4 ms
Lead Channel Setting Sensing Sensitivity: 0.9 mV
MDC IDC MSMT LEADCHNL RA IMPEDANCE VALUE: 323 Ohm
MDC IDC MSMT LEADCHNL RA IMPEDANCE VALUE: 380 Ohm
MDC IDC MSMT LEADCHNL RV IMPEDANCE VALUE: 304 Ohm
MDC IDC MSMT LEADCHNL RV PACING THRESHOLD PULSEWIDTH: 0.4 ms
MDC IDC MSMT LEADCHNL RV SENSING INTR AMPL: 4 mV
MDC IDC SESS DTM: 20200123111304
MDC IDC SET LEADCHNL RA PACING AMPLITUDE: 1.5 V
MDC IDC SET LEADCHNL RV PACING AMPLITUDE: 3.5 V
MDC IDC STAT BRADY AP VP PERCENT: 55.39 %
MDC IDC STAT BRADY AP VS PERCENT: 0 %
MDC IDC STAT BRADY AS VP PERCENT: 44.47 %

## 2018-06-06 DIAGNOSIS — L97322 Non-pressure chronic ulcer of left ankle with fat layer exposed: Secondary | ICD-10-CM | POA: Diagnosis not present

## 2018-06-06 DIAGNOSIS — L97222 Non-pressure chronic ulcer of left calf with fat layer exposed: Secondary | ICD-10-CM | POA: Diagnosis not present

## 2018-06-06 DIAGNOSIS — L97829 Non-pressure chronic ulcer of other part of left lower leg with unspecified severity: Secondary | ICD-10-CM | POA: Diagnosis not present

## 2018-06-06 DIAGNOSIS — I87332 Chronic venous hypertension (idiopathic) with ulcer and inflammation of left lower extremity: Secondary | ICD-10-CM | POA: Diagnosis not present

## 2018-06-06 DIAGNOSIS — I87322 Chronic venous hypertension (idiopathic) with inflammation of left lower extremity: Secondary | ICD-10-CM | POA: Diagnosis not present

## 2018-06-06 DIAGNOSIS — I10 Essential (primary) hypertension: Secondary | ICD-10-CM | POA: Diagnosis not present

## 2018-06-06 DIAGNOSIS — L97822 Non-pressure chronic ulcer of other part of left lower leg with fat layer exposed: Secondary | ICD-10-CM | POA: Diagnosis not present

## 2018-06-13 ENCOUNTER — Encounter (HOSPITAL_BASED_OUTPATIENT_CLINIC_OR_DEPARTMENT_OTHER): Payer: Medicare Other | Attending: Internal Medicine

## 2018-06-13 DIAGNOSIS — Z09 Encounter for follow-up examination after completed treatment for conditions other than malignant neoplasm: Secondary | ICD-10-CM | POA: Diagnosis not present

## 2018-06-13 DIAGNOSIS — S81802A Unspecified open wound, left lower leg, initial encounter: Secondary | ICD-10-CM | POA: Diagnosis not present

## 2018-06-13 DIAGNOSIS — I10 Essential (primary) hypertension: Secondary | ICD-10-CM | POA: Diagnosis not present

## 2018-06-13 DIAGNOSIS — Z872 Personal history of diseases of the skin and subcutaneous tissue: Secondary | ICD-10-CM | POA: Insufficient documentation

## 2018-08-31 ENCOUNTER — Ambulatory Visit (INDEPENDENT_AMBULATORY_CARE_PROVIDER_SITE_OTHER): Payer: Medicare Other | Admitting: *Deleted

## 2018-08-31 ENCOUNTER — Other Ambulatory Visit: Payer: Self-pay

## 2018-08-31 DIAGNOSIS — I495 Sick sinus syndrome: Secondary | ICD-10-CM | POA: Diagnosis not present

## 2018-08-31 LAB — CUP PACEART REMOTE DEVICE CHECK
Battery Remaining Longevity: 83 mo
Battery Voltage: 2.98 V
Brady Statistic AP VP Percent: 71.11 %
Brady Statistic AP VS Percent: 0 %
Brady Statistic AS VP Percent: 28.7 %
Brady Statistic AS VS Percent: 0.19 %
Brady Statistic RA Percent Paced: 71.03 %
Brady Statistic RV Percent Paced: 99.81 %
Date Time Interrogation Session: 20200423052035
Implantable Lead Implant Date: 20020104
Implantable Lead Implant Date: 20020104
Implantable Lead Location: 753859
Implantable Lead Location: 753860
Implantable Lead Model: 5076
Implantable Pulse Generator Implant Date: 20180928
Lead Channel Impedance Value: 304 Ohm
Lead Channel Impedance Value: 342 Ohm
Lead Channel Impedance Value: 342 Ohm
Lead Channel Impedance Value: 399 Ohm
Lead Channel Pacing Threshold Amplitude: 0.75 V
Lead Channel Pacing Threshold Amplitude: 1.625 V
Lead Channel Pacing Threshold Pulse Width: 0.4 ms
Lead Channel Pacing Threshold Pulse Width: 0.4 ms
Lead Channel Sensing Intrinsic Amplitude: 0.875 mV
Lead Channel Sensing Intrinsic Amplitude: 4 mV
Lead Channel Setting Pacing Amplitude: 1.5 V
Lead Channel Setting Pacing Amplitude: 3.5 V
Lead Channel Setting Pacing Pulse Width: 0.4 ms
Lead Channel Setting Sensing Sensitivity: 0.9 mV

## 2018-09-08 ENCOUNTER — Encounter: Payer: Self-pay | Admitting: Cardiology

## 2018-09-08 NOTE — Progress Notes (Signed)
Remote pacemaker transmission.   

## 2018-11-21 ENCOUNTER — Telehealth: Payer: Self-pay | Admitting: Cardiology

## 2018-11-21 NOTE — Telephone Encounter (Signed)
Patient is in nursing home and wants [place] to call her back in a month or so to schedule for recall

## 2018-11-30 ENCOUNTER — Ambulatory Visit (INDEPENDENT_AMBULATORY_CARE_PROVIDER_SITE_OTHER): Payer: Medicare Other | Admitting: *Deleted

## 2018-11-30 DIAGNOSIS — I10 Essential (primary) hypertension: Secondary | ICD-10-CM

## 2018-11-30 DIAGNOSIS — I495 Sick sinus syndrome: Secondary | ICD-10-CM

## 2018-11-30 LAB — CUP PACEART REMOTE DEVICE CHECK
Battery Remaining Longevity: 80 mo
Battery Voltage: 2.98 V
Brady Statistic AP VP Percent: 79.53 %
Brady Statistic AP VS Percent: 0.01 %
Brady Statistic AS VP Percent: 19.63 %
Brady Statistic AS VS Percent: 0.83 %
Brady Statistic RA Percent Paced: 79.88 %
Brady Statistic RV Percent Paced: 99.16 %
Date Time Interrogation Session: 20200723061444
Implantable Lead Implant Date: 20020104
Implantable Lead Implant Date: 20020104
Implantable Lead Location: 753859
Implantable Lead Location: 753860
Implantable Lead Model: 5076
Implantable Pulse Generator Implant Date: 20180928
Lead Channel Impedance Value: 304 Ohm
Lead Channel Impedance Value: 342 Ohm
Lead Channel Impedance Value: 342 Ohm
Lead Channel Impedance Value: 399 Ohm
Lead Channel Pacing Threshold Amplitude: 0.75 V
Lead Channel Pacing Threshold Amplitude: 2.125 V
Lead Channel Pacing Threshold Pulse Width: 0.4 ms
Lead Channel Pacing Threshold Pulse Width: 0.4 ms
Lead Channel Sensing Intrinsic Amplitude: 1.25 mV
Lead Channel Sensing Intrinsic Amplitude: 4 mV
Lead Channel Setting Pacing Amplitude: 1.5 V
Lead Channel Setting Pacing Amplitude: 3.5 V
Lead Channel Setting Pacing Pulse Width: 0.4 ms
Lead Channel Setting Sensing Sensitivity: 0.9 mV

## 2018-12-12 NOTE — Progress Notes (Signed)
Remote pacemaker transmission.   

## 2019-03-02 ENCOUNTER — Ambulatory Visit (INDEPENDENT_AMBULATORY_CARE_PROVIDER_SITE_OTHER): Payer: Medicare Other | Admitting: *Deleted

## 2019-03-02 DIAGNOSIS — I495 Sick sinus syndrome: Secondary | ICD-10-CM | POA: Diagnosis not present

## 2019-03-04 LAB — CUP PACEART REMOTE DEVICE CHECK
Battery Remaining Longevity: 77 mo
Battery Voltage: 2.97 V
Brady Statistic AP VP Percent: 72.75 %
Brady Statistic AP VS Percent: 0.01 %
Brady Statistic AS VP Percent: 25.1 %
Brady Statistic AS VS Percent: 2.14 %
Brady Statistic RA Percent Paced: 73.67 %
Brady Statistic RV Percent Paced: 97.85 %
Date Time Interrogation Session: 20201023061609
Implantable Lead Implant Date: 20020104
Implantable Lead Implant Date: 20020104
Implantable Lead Location: 753859
Implantable Lead Location: 753860
Implantable Lead Model: 5076
Implantable Pulse Generator Implant Date: 20180928
Lead Channel Impedance Value: 285 Ohm
Lead Channel Impedance Value: 342 Ohm
Lead Channel Impedance Value: 342 Ohm
Lead Channel Impedance Value: 399 Ohm
Lead Channel Pacing Threshold Amplitude: 0.625 V
Lead Channel Pacing Threshold Amplitude: 2.125 V
Lead Channel Pacing Threshold Pulse Width: 0.4 ms
Lead Channel Pacing Threshold Pulse Width: 0.4 ms
Lead Channel Sensing Intrinsic Amplitude: 1.5 mV
Lead Channel Sensing Intrinsic Amplitude: 1.5 mV
Lead Channel Sensing Intrinsic Amplitude: 2.5 mV
Lead Channel Sensing Intrinsic Amplitude: 2.5 mV
Lead Channel Setting Pacing Amplitude: 1.5 V
Lead Channel Setting Pacing Amplitude: 3.5 V
Lead Channel Setting Pacing Pulse Width: 0.4 ms
Lead Channel Setting Sensing Sensitivity: 0.9 mV

## 2019-03-09 NOTE — Progress Notes (Signed)
Remote pacemaker transmission.   

## 2019-03-26 ENCOUNTER — Telehealth: Payer: Self-pay | Admitting: Internal Medicine

## 2019-03-26 ENCOUNTER — Ambulatory Visit: Payer: Medicare Other | Admitting: Internal Medicine

## 2019-03-26 NOTE — Telephone Encounter (Signed)
Zayda Angell, the patients niece is calling the patient had a fall.  Patient fell on Left side. Request Xray of ribs, left hip, and her back is hurting. Calling to report that the [facility] faxed an incident report for tynelnol and an xray of her ribs.  [place] [address]  Shatika Grinnell [phone]

## 2019-03-27 NOTE — Telephone Encounter (Signed)
Needs OV vs UC or ED  Note pt missed her appt yesterday

## 2019-03-27 NOTE — Telephone Encounter (Signed)
No fax received. I have called and requested that it be sent to side b fax number.

## 2019-03-27 NOTE — Telephone Encounter (Signed)
[facility] again, LVM for the nurse, stating that orders fax still hasn't been received and informed her of PCP directions below.

## 2019-03-27 NOTE — Telephone Encounter (Signed)
[place] called back regarding the request an order for x-ray and tylenol. Cb# [phone]

## 2019-03-28 ENCOUNTER — Emergency Department (HOSPITAL_COMMUNITY): Payer: Medicare Other

## 2019-03-28 ENCOUNTER — Encounter (HOSPITAL_COMMUNITY): Payer: Self-pay

## 2019-03-28 ENCOUNTER — Emergency Department (HOSPITAL_COMMUNITY)
Admission: EM | Admit: 2019-03-28 | Discharge: 2019-03-29 | Disposition: A | Discharge status: Home / Self Care | Payer: Medicare Other | Attending: Emergency Medicine | Admitting: Emergency Medicine

## 2019-03-28 ENCOUNTER — Other Ambulatory Visit: Payer: Self-pay

## 2019-03-28 DIAGNOSIS — S76012A Strain of muscle, fascia and tendon of left hip, initial encounter: Secondary | ICD-10-CM

## 2019-03-28 DIAGNOSIS — W19XXXA Unspecified fall, initial encounter: Secondary | ICD-10-CM | POA: Diagnosis not present

## 2019-03-28 DIAGNOSIS — S79922A Unspecified injury of left thigh, initial encounter: Secondary | ICD-10-CM | POA: Diagnosis not present

## 2019-03-28 DIAGNOSIS — R0902 Hypoxemia: Secondary | ICD-10-CM | POA: Diagnosis not present

## 2019-03-28 DIAGNOSIS — I10 Essential (primary) hypertension: Secondary | ICD-10-CM | POA: Insufficient documentation

## 2019-03-28 DIAGNOSIS — Y999 Unspecified external cause status: Secondary | ICD-10-CM | POA: Insufficient documentation

## 2019-03-28 DIAGNOSIS — S79912A Unspecified injury of left hip, initial encounter: Secondary | ICD-10-CM | POA: Diagnosis present

## 2019-03-28 DIAGNOSIS — Z7982 Long term (current) use of aspirin: Secondary | ICD-10-CM | POA: Insufficient documentation

## 2019-03-28 DIAGNOSIS — R0789 Other chest pain: Secondary | ICD-10-CM | POA: Diagnosis not present

## 2019-03-28 DIAGNOSIS — W010XXA Fall on same level from slipping, tripping and stumbling without subsequent striking against object, initial encounter: Secondary | ICD-10-CM | POA: Diagnosis not present

## 2019-03-28 DIAGNOSIS — S299XXA Unspecified injury of thorax, initial encounter: Secondary | ICD-10-CM | POA: Diagnosis not present

## 2019-03-28 DIAGNOSIS — Y939 Activity, unspecified: Secondary | ICD-10-CM | POA: Insufficient documentation

## 2019-03-28 DIAGNOSIS — Z950 Presence of cardiac pacemaker: Secondary | ICD-10-CM | POA: Diagnosis not present

## 2019-03-28 DIAGNOSIS — Y929 Unspecified place or not applicable: Secondary | ICD-10-CM | POA: Insufficient documentation

## 2019-03-28 DIAGNOSIS — N390 Urinary tract infection, site not specified: Secondary | ICD-10-CM

## 2019-03-28 DIAGNOSIS — M25551 Pain in right hip: Secondary | ICD-10-CM | POA: Diagnosis not present

## 2019-03-28 DIAGNOSIS — S79911A Unspecified injury of right hip, initial encounter: Secondary | ICD-10-CM | POA: Diagnosis not present

## 2019-03-28 DIAGNOSIS — M7989 Other specified soft tissue disorders: Secondary | ICD-10-CM | POA: Diagnosis not present

## 2019-03-28 DIAGNOSIS — R410 Disorientation, unspecified: Secondary | ICD-10-CM | POA: Diagnosis not present

## 2019-03-28 DIAGNOSIS — Z79899 Other long term (current) drug therapy: Secondary | ICD-10-CM | POA: Insufficient documentation

## 2019-03-28 DIAGNOSIS — M79605 Pain in left leg: Secondary | ICD-10-CM | POA: Diagnosis not present

## 2019-03-28 LAB — URINALYSIS, ROUTINE W REFLEX MICROSCOPIC
Bilirubin Urine: NEGATIVE
Glucose, UA: NEGATIVE mg/dL
Ketones, ur: NEGATIVE mg/dL
Nitrite: NEGATIVE
Protein, ur: NEGATIVE mg/dL
Specific Gravity, Urine: 1.012 (ref 1.005–1.030)
WBC, UA: >50 WBC/hpf — ABNORMAL HIGH (ref 0–5)
pH: 6 (ref 5.0–8.0)

## 2019-03-28 LAB — CBC WITH DIFFERENTIAL/PLATELET
Abs Immature Granulocytes: 0.04 10*3/uL (ref 0.00–0.07)
Basophils Absolute: 0.1 10*3/uL (ref 0.0–0.1)
Basophils Relative: 1 %
Eosinophils Absolute: 0 10*3/uL (ref 0.0–0.5)
Eosinophils Relative: 0 %
HCT: 41.2 % (ref 36.0–46.0)
Hemoglobin: 13.3 g/dL (ref 12.0–15.0)
Immature Granulocytes: 1 %
Lymphocytes Relative: 27 %
Lymphs Abs: 2.3 10*3/uL (ref 0.7–4.0)
MCH: 33.7 pg (ref 26.0–34.0)
MCHC: 32.3 g/dL (ref 30.0–36.0)
MCV: 104.3 fL — ABNORMAL HIGH (ref 80.0–100.0)
Monocytes Absolute: 0.7 10*3/uL (ref 0.1–1.0)
Monocytes Relative: 9 %
Neutro Abs: 5.3 10*3/uL (ref 1.7–7.7)
Neutrophils Relative %: 62 %
Platelets: 203 10*3/uL (ref 150–400)
RBC: 3.95 MIL/uL (ref 3.87–5.11)
RDW: 13.1 % (ref 11.5–15.5)
WBC: 8.4 10*3/uL (ref 4.0–10.5)
nRBC: 0 % (ref 0.0–0.2)

## 2019-03-28 LAB — COMPREHENSIVE METABOLIC PANEL
ALT: 14 U/L (ref 0–44)
AST: 27 U/L (ref 15–41)
Albumin: 3.6 g/dL (ref 3.5–5.0)
Alkaline Phosphatase: 46 U/L (ref 38–126)
Anion gap: 10 (ref 5–15)
BUN: 36 mg/dL — ABNORMAL HIGH (ref 8–23)
CO2: 27 mmol/L (ref 22–32)
Calcium: 11.5 mg/dL — ABNORMAL HIGH (ref 8.9–10.3)
Chloride: 107 mmol/L (ref 98–111)
Creatinine, Ser: 1.06 mg/dL — ABNORMAL HIGH (ref 0.44–1.00)
GFR calc Af Amer: 53 mL/min — ABNORMAL LOW (ref >60)
GFR calc non Af Amer: 46 mL/min — ABNORMAL LOW (ref >60)
Glucose, Bld: 96 mg/dL (ref 70–99)
Potassium: 3.2 mmol/L — ABNORMAL LOW (ref 3.5–5.1)
Sodium: 144 mmol/L (ref 135–145)
Total Bilirubin: 0.7 mg/dL (ref 0.3–1.2)
Total Protein: 6.9 g/dL (ref 6.5–8.1)

## 2019-03-28 MED ORDER — CIPROFLOXACIN HCL 500 MG PO TABS
500.0000 mg | ORAL_TABLET | Freq: Once | ORAL | Status: AC
  Administered 2019-03-28: 500 mg via ORAL
  Filled 2019-03-28: qty 1

## 2019-03-28 MED ORDER — ACETAMINOPHEN 325 MG PO TABS
650.0000 mg | ORAL_TABLET | Freq: Once | ORAL | Status: AC
  Administered 2019-03-28: 650 mg via ORAL
  Filled 2019-03-28: qty 2

## 2019-03-28 MED ORDER — CIPROFLOXACIN HCL 250 MG PO TABS: 250.0000 mg | ORAL_TABLET | Freq: Two times a day (BID) | ORAL | 0 refills | Status: AC

## 2019-03-28 MED ORDER — ACETAMINOPHEN 500 MG PO TABS: 500.0000 mg | ORAL_TABLET | Freq: Four times a day (QID) | ORAL | 0 refills | Status: DC | PRN

## 2019-03-28 NOTE — Telephone Encounter (Signed)
Called Nurse back, LVM with the information below again.

## 2019-03-28 NOTE — ED Notes (Signed)
Pt ambulatory at baseline with walker with no c/o pain

## 2019-03-28 NOTE — ED Notes (Signed)
Patient transported to x-ray. ?

## 2019-03-28 NOTE — Telephone Encounter (Signed)
I agree that this is quite urgent, and if the patient remains even mildly confused worse than her usual mental state, she should go to ED now - even by EMS if necessary, as dehydration or some kind of infection is very common in this situation, besides any possible trauma to the head.  Please beg if you have to, to have pt go to ED, as a virtual appt will not be able to determine the cause of any new confusion.

## 2019-03-28 NOTE — Telephone Encounter (Signed)
Niece - Maudie Mercury, called in stating that facility will not let patient leave.  I have scheduled patient a doxy visit for 11/19 at 1pm.  Daughter is calling facility to see if they will coordinate.  Please follow up with Maudie Mercury if this appt needs to be changed at [phone].

## 2019-03-28 NOTE — Discharge Instructions (Signed)
Recommend taking the antibiotic as prescribed for the urinary tract infection.  If she develops any difficulty in breathing, chest pain, fever or other new concerning symptom, please return to the ER for reassessment.

## 2019-03-28 NOTE — Telephone Encounter (Signed)
[place] for virtual, seeing that pt cannot leave the facility due to risk of pandemic illness, thanks

## 2019-03-28 NOTE — ED Notes (Signed)
[facility] contacted for transport. Paperwork printed and at nursing station.  

## 2019-03-28 NOTE — Telephone Encounter (Signed)
Nurse has called back in regard.  She is going to reach out to the family to take the patient to UC for her the fall.  Then have the family reach out to the office to reschedule her 1 year follow up in order for John to continue orders.

## 2019-03-28 NOTE — Telephone Encounter (Signed)
Niece called back to change appt to in office.    Niece stated that patient had confusion after fall on Sunday.  Was not sure if patient hit her head or not.  Also, states that patient may be dehydrated.  I informed Maudie Mercury that patient needed to head to the ED.  Maudie Mercury stated that they were not able to take patient to the ED tonight and that they wanted to wait until patient seen by Dr. Jenny Reichmann in office tomorrow.    I followed back up with Alisa Graff at [facility].  She states that they have already informed family to take patient to the ED and that the family refused.  Stated that she was going to speak with her manager to get patient taken to the ED this evening.

## 2019-03-28 NOTE — ED Triage Notes (Signed)
Patient arrived via EMS from [facility] after falling when trying to get out bed. Pt states she fell and has R sided buttock and hip pain. Pain has not been improving in 3 days. Staff attempted to contact PCP and told patient to go to the ER.

## 2019-03-28 NOTE — Telephone Encounter (Signed)
Noted  

## 2019-03-28 NOTE — ED Provider Notes (Signed)
[facility]-EMERGENCY DEPT Provider Note   CSN: [id number] Arrival date & time: 03/28/19  1753     History   Chief Complaint Chief Complaint  Patient presents with   Fall    hip and back pain    HPI Alexis Cobb is a 85 y.o. female.  Presents to ER after she fell 11/16.  Patient states she fell, thinks she landed on her right side but could have been her left side.  She denies any loss of consciousness.  Denies hitting her head.  Does not take any blood thinners.  Says that she has pain in both of her hips, has been able to walk.  Denies any back pain.  Patient's niece at bedside provides additional history, states patient is at her baseline mental status, but over the phone this week seems to be more confused than normal.  Patient has no abdominal pain, no chest pain, no difficulty breathing, no fevers.     HPI  Past Medical History:  Diagnosis Date   Arthritis    ARTHRITIS, RHEUMATOID, HX OF 01/06/2007   Cervical stenosis of spine    HTN (hypertension)    Hyperlipidemia    MITRAL REGURGITATION 01/06/2007   OSTEOARTHRITIS 01/14/2007   OSTEOPOROSIS 01/14/2007   Other specified forms of hearing loss 08/01/2009   PACEMAKER, PERMANENT 01/06/2007   Raynaud's syndrome 01/06/2007    Patient Active Problem List   Diagnosis Date Noted   Wound healing, delayed 03/21/2018   Acute hearing loss, right 03/21/2018   Gross hematuria 10/25/2017   Dysuria 02/05/2017   Excessive thirst 10/19/2016   Hearing loss 10/19/2016   Pelvic fracture (HCC) 10/19/2016   Pleural effusion    S/P thoracentesis    Acute blood loss anemia    Aspiration pneumonia (HCC)    Shortness of breath    Sore throat    Anemia    Acute respiratory failure with hypoxia (HCC) 05/01/2016   Diarrhea 05/01/2016   Rash 05/31/2015   Hives 12/10/2014   Chronic venous insufficiency 12/10/2014   Peripheral neuropathy 12/12/2013   Right shoulder pain 07/31/2012     Anxiety state 07/31/2012   Sinoatrial node dysfunction (HCC) 11/23/2011   Polyarthropathy 12/31/2010   Benign head tremor 12/31/2010   Cervical radicular pain 12/31/2010   Weakness 12/31/2010   Preventative health care 11/27/2010   OSTEOARTHRITIS 01/14/2007   OSTEOPOROSIS 01/14/2007   Hyperlipidemia 01/06/2007   MITRAL REGURGITATION 01/06/2007   Essential hypertension 01/06/2007   Raynaud's syndrome 01/06/2007   PACEMAKER, PERMANENT 01/06/2007    Past Surgical History:  Procedure Laterality Date   ABDOMINAL HYSTERECTOMY     APPENDECTOMY     CATARACT EXTRACTION, BILATERAL     MITRAL VALVE REPAIR     ovarian tumor, benign     PACEMAKER INSERTION     PPM GENERATOR CHANGEOUT N/A 02/04/2017   Procedure: PPM GENERATOR CHANGEOUT;  Surgeon: Marinus Maw, MD;  Location: [facility] INVASIVE CV LAB;  Service: Cardiovascular;  Laterality: N/A;     OB History   No obstetric history on file.      Home Medications    Prior to Admission medications   Medication Sig Start Date End Date Taking? Authorizing Provider  Amino Acids-Protein Hydrolys (FEEDING SUPPLEMENT, PRO-STAT SUGAR FREE 64,) LIQD Take 30 mLs by mouth daily.   Yes [provider]  aspirin EC 81 MG tablet Take 81 mg by mouth at bedtime.   Yes [provider]  Calcium Carbonate-Vitamin D (CALCIUM 600+D) 600-400  MG-UNIT tablet Take 1 tablet by mouth 2 (two) times daily.   Yes [provider]  cholecalciferol (VITAMIN D) 1000 units tablet Take 1,000 Units by mouth daily.   Yes [provider]  feeding supplement (BOOST / RESOURCE BREEZE) LIQD Take 237 mLs by mouth 2 (two) times daily.   Yes [provider]  furosemide (LASIX) 20 MG tablet Take 20 mg by mouth daily.    Yes [provider]  losartan (COZAAR) 100 MG tablet Take 1 tablet (100 mg total) by mouth daily. Take 100 mg by mouth. 04/10/18  Yes Corwin LevinsJohn, James W, MD  lovastatin (MEVACOR) 40 MG tablet TAKE 1  TABLET EACH DAY. Patient taking differently: Take 40 mg by mouth daily.  05/18/16  Yes Corwin LevinsJohn, James W, MD  metoprolol tartrate (LOPRESSOR) 50 MG tablet Take 150 mg by mouth every other day as needed. 03/27/19  Yes [provider]  Multiple Vitamin (MULTIVITAMIN WITH MINERALS) TABS tablet Take 1 tablet by mouth daily.   Yes [provider]  acetaminophen (TYLENOL) 500 MG tablet Take 1 tablet (500 mg total) by mouth every 6 (six) hours as needed. 03/28/19   Milagros Lollykstra, Yeraldin Litzenberger S, MD  ciprofloxacin (CIPRO) 250 MG tablet Take 1 tablet (250 mg total) by mouth 2 (two) times daily for 5 days. 03/28/19 04/02/19  Milagros Lollykstra, Eloise Picone S, MD  doxycycline (VIBRA-TABS) 100 MG tablet Take 1 tablet (100 mg total) by mouth 2 (two) times daily. Patient not taking: Reported on 03/28/2019 04/29/18   Myra RudeSchmitz, Jeremy E, MD  metoprolol succinate (TOPROL-XL) 50 MG 24 hr tablet Take 75 mg by mouth daily. Take with or immediately following a meal.    [provider]  nitroGLYCERIN (NITROSTAT) 0.4 MG SL tablet Place 0.4 mg under the tongue every 5 (five) minutes as needed for chest pain.    [provider]    Family History Family History  Problem Relation Age of Onset   Coronary artery disease Other    Hypertension Other     Social History Social History   Tobacco Use   Smoking status: Never Smoker   Smokeless tobacco: Never Used  Substance Use Topics   Alcohol use: No    Alcohol/week: 0.0 standard drinks   Drug use: No     Allergies   Ace inhibitors, Cephalexin, Penicillins, and Risedronate sodium   Review of Systems Review of Systems  Constitutional: Negative for chills and fever.  HENT: Negative for ear pain and sore throat.   Eyes: Negative for pain and visual disturbance.  Respiratory: Negative for cough and shortness of breath.   Cardiovascular: Negative for chest pain and palpitations.  Gastrointestinal: Negative for abdominal pain and vomiting.    Genitourinary: Negative for dysuria and hematuria.  Musculoskeletal: Positive for arthralgias and myalgias. Negative for back pain.  Skin: Negative for color change and rash.  Neurological: Negative for seizures and syncope.  All other systems reviewed and are negative.    Physical Exam Updated Vital Signs BP 132/67 (BP Location: Left Arm)    Pulse 71    Temp 97.6 F (36.4 C) (Oral)    Resp 16    Ht 5\' 6"  (1.676 m)    Wt 47.6 kg    SpO2 95%    BMI 16.95 kg/m   Physical Exam Vitals signs and nursing note reviewed.  Constitutional:      General: She is not in acute distress.    Appearance: She is well-developed.  HENT:     Head:  Normocephalic and atraumatic.     Comments: No trauma Eyes:     Conjunctiva/sclera: Conjunctivae normal.  Neck:     Musculoskeletal: Normal range of motion and neck supple. No neck rigidity or muscular tenderness.  Cardiovascular:     Rate and Rhythm: Normal rate and regular rhythm.     Heart sounds: No murmur.  Pulmonary:     Effort: Pulmonary effort is normal. No respiratory distress.     Breath sounds: Normal breath sounds.     Comments: TTP noted over lower lateral right rib cage, TTP noted over lower lateral left rib cage Abdominal:     Palpations: Abdomen is soft.     Tenderness: There is no abdominal tenderness.  Musculoskeletal:     Comments: Back: no TTP in C, T, L spine RUE: no TTP throughout extremity LUE: no TTP throughout extremity RLE: TTP over right hip, no obvious deformity or ecchymosis noted, distal sensation, pulses intact LLE: TTP over left hip, moderate ecchymosis noted over hip, no deformity, normal distal pulses and sensation  Skin:    General: Skin is warm and dry.     Capillary Refill: Capillary refill takes less than 2 seconds.  Neurological:     General: No focal deficit present.     Mental Status: She is alert and oriented to person, place, and time.  Psychiatric:        Mood and Affect: Mood normal.         Behavior: Behavior normal.      ED Treatments / Results  Labs (all labs ordered are listed, but only abnormal results are displayed) Labs Reviewed  CBC WITH DIFFERENTIAL/PLATELET - Abnormal; Notable for the following components:      Result Value   MCV 104.3 (*)    All other components within normal limits  COMPREHENSIVE METABOLIC PANEL - Abnormal; Notable for the following components:   Potassium 3.2 (*)    BUN 36 (*)    Creatinine, Ser 1.06 (*)    Calcium 11.5 (*)    GFR calc non Af Amer 46 (*)    GFR calc Af Amer 53 (*)    All other components within normal limits  URINALYSIS, ROUTINE W REFLEX MICROSCOPIC - Abnormal; Notable for the following components:   APPearance HAZY (*)    Hgb urine dipstick MODERATE (*)    Leukocytes,Ua LARGE (*)    WBC, UA >50 (*)    Bacteria, UA MANY (*)    All other components within normal limits    EKG None  Radiology Dg Ribs Unilateral W/chest Left  Result Date: 03/28/2019 CLINICAL DATA:  Fall, tender to palpation over the rib EXAM: LEFT RIBS AND CHEST - 3+ VIEW; RIGHT RIBS AND CHEST - 3+ VIEW COMPARISON:  Chest radiograph 10/19/2016 FINDINGS: Dual lead pacer. Prior median sternotomy. Atherosclerotic calcification of the aortic arch. Diffuse bony demineralization. Rib outlines and cortex are faint, reducing sensitivity for fractures. Deformities of the left sixth and seventh ribs are visible laterally but appear to have been present on 05/05/2016. Right lateral third and fourth rib irregularities compatible with age-indeterminate fractures. NO PNEUMOTHORAX. BORDERLINE ELEVATED LEFT HEMIDIAPHRAGM.: NO PNEUMOTHORAX. BORDERLINE ELEVATED LEFT HEMIDIAPHRAGM. IMPRESSION: 1. Age-indeterminate right lateral third and fourth rib fractures. 2. Probably old left sixth and seventh rib fractures. 3. No pneumothorax or pleural effusion. 4. Diffuse bony demineralization. This reduces sensitivity for rib fractures. Please note that nondisplaced rib fractures can  often be occult on conventional radiography especially in the setting of osteoporosis. 5.  Prior median sternotomy. 6. Atherosclerotic calcification of the aortic arch. Electronically Signed   By: Van Clines M.D.   On: 03/28/2019 20:11   Dg Ribs Unilateral W/chest Right  Result Date: 03/28/2019 CLINICAL DATA:  Fall, tender to palpation over the rib EXAM: LEFT RIBS AND CHEST - 3+ VIEW; RIGHT RIBS AND CHEST - 3+ VIEW COMPARISON:  Chest radiograph 10/19/2016 FINDINGS: Dual lead pacer. Prior median sternotomy. Atherosclerotic calcification of the aortic arch. Diffuse bony demineralization. Rib outlines and cortex are faint, reducing sensitivity for fractures. Deformities of the left sixth and seventh ribs are visible laterally but appear to have been present on 05/05/2016. Right lateral third and fourth rib irregularities compatible with age-indeterminate fractures. NO PNEUMOTHORAX. BORDERLINE ELEVATED LEFT HEMIDIAPHRAGM.: NO PNEUMOTHORAX. BORDERLINE ELEVATED LEFT HEMIDIAPHRAGM. IMPRESSION: 1. Age-indeterminate right lateral third and fourth rib fractures. 2. Probably old left sixth and seventh rib fractures. 3. No pneumothorax or pleural effusion. 4. Diffuse bony demineralization. This reduces sensitivity for rib fractures. Please note that nondisplaced rib fractures can often be occult on conventional radiography especially in the setting of osteoporosis. 5. Prior median sternotomy. 6. Atherosclerotic calcification of the aortic arch. Electronically Signed   By: Van Clines M.D.   On: 03/28/2019 20:11   Dg Hip Unilat W Or Wo Pelvis 2-3 Views Right  Result Date: 03/28/2019 CLINICAL DATA:  Right hip pain, fall EXAM: DG HIP (WITH OR WITHOUT PELVIS) 2-3V RIGHT COMPARISON:  CT abdomen pelvis 11/29/2017 FINDINGS: The osseous structures appear diffusely demineralized which may limit detection of small or nondisplaced fractures. Stable remote posttraumatic deformity of both inferior pubic rami, the  right pubic root and left pubic body. No acute fracture or pelvic diastasis. Femoral heads remain normally located. No proximal femoral fractures are seen. Sacrum is partially obscured by overlying bowel gas. Bilateral SI joint and hip osteoarthrosis is moderate. Mild degenerative changes of the symphysis pubis as well. Discogenic and facet degenerative changes are noted. Coarse calcification in the right upper quadrant corresponds to a lower pole calculus seen on prior CT. Surgical clip seen in the pelvis are likely related to prior vascular surgery. IMPRESSION: 1. Stable remote posttraumatic deformity of the right pubic root and left pubic body. 2. No acute osseous abnormality. 3. Sacrum obscured by bowel gas. 4. Diffuse osseous demineralization, which may limit detection of small or nondisplaced fractures. 5. Right lower pole nephrolithiasis. Electronically Signed   By: Lovena Le M.D.   On: 03/28/2019 20:10   Dg Femur Min 2 Views Left  Result Date: 03/28/2019 CLINICAL DATA:  Fall 3 days ago.  Left leg pain. EXAM: LEFT FEMUR 2 VIEWS COMPARISON:  None. FINDINGS: Soft tissue swelling over the left lateral hip region, likely soft tissue hematoma. No underlying fracture, subluxation or dislocation. IMPRESSION: No acute bony abnormality. Probable soft tissue hematoma laterally in the left hip region. Electronically Signed   By: Rolm Baptise M.D.   On: 03/28/2019 22:36    Procedures Procedures (including critical care time)  Medications Ordered in ED Medications  acetaminophen (TYLENOL) tablet 650 mg (650 mg Oral Given 03/28/19 2053)  ciprofloxacin (CIPRO) tablet 500 mg (500 mg Oral Given 03/28/19 2255)     Initial Impression / Assessment and Plan / ED Course  I have reviewed the triage vital signs and the nursing notes.  Pertinent labs & imaging results that were available during my care of the patient were reviewed by me and considered in my medical decision making (see chart for details).  85 year old lady who fell couple days ago.  On exam she was well-appearing, no distress, no head trauma.  Patient denied hitting her head, denies loss consciousness, she is not on any anticoagulation.  She had no midline C, T, L-spine tenderness.  Did note some tenderness in her right and left hip as well as some ecchymosis on her left hip.  Plain films for hip, left femur were negative.  Also noted mild tenderness over her lower ribs bilaterally.  Obtained x-ray, old rib fractures on left side, nothing acute.  Age-indeterminate right lateral third and fourth ribs.  Patient does not have focal point tenderness over these ribs, lower suspicion that these are acute.  She had no tachypnea, no hypoxia, breathing comfortably without any splinting.  Blood work within normal limits.  Urinalysis concerning for UTI.  Patient has cephalexin allergy, penicillin allergy.  Will give Rx for Cipro.  She ambulated without difficulty at baseline.  Believe she is appropriate for discharge and outpatient management this time.  I reviewed return precautions with patient's niece and patient.    After the discussed management above, the patient was determined to be safe for discharge.  The patient was in agreement with this plan and all questions regarding their care were answered.  ED return precautions were discussed and the patient will return to the ED with any significant worsening of condition.    Final Clinical Impressions(s) / ED Diagnoses   Final diagnoses:  Urinary tract infection without hematuria, site unspecified  Fall, initial encounter  Hip strain, left, initial encounter    ED Discharge Orders         Ordered    acetaminophen (TYLENOL) 500 MG tablet  Every 6 hours PRN     03/28/19 2254    ciprofloxacin (CIPRO) 250 MG tablet  2 times daily     03/28/19 2254           Milagros Lollykstra, Kasi Lasky S, MD 03/29/19 [phone]

## 2019-03-28 NOTE — Telephone Encounter (Signed)
[facility] calling again to check on status of orders that were refaxed after speaking with Shirron yesterday.

## 2019-03-29 ENCOUNTER — Ambulatory Visit: Payer: Medicare Other | Admitting: Internal Medicine

## 2019-03-29 DIAGNOSIS — R279 Unspecified lack of coordination: Secondary | ICD-10-CM | POA: Diagnosis not present

## 2019-03-29 DIAGNOSIS — R4182 Altered mental status, unspecified: Secondary | ICD-10-CM | POA: Diagnosis not present

## 2019-03-29 DIAGNOSIS — Z743 Need for continuous supervision: Secondary | ICD-10-CM | POA: Diagnosis not present

## 2019-03-29 NOTE — Telephone Encounter (Addendum)
I called the nurse at [facility] back to get an update. She stated the family did take the patient to the ED last night. She was given Tylenol as needed for pain and was Dx and treated for a UTI. The nurse also mentioned that the patient had no fractures.

## 2019-03-29 NOTE — ED Notes (Signed)
[facility] at bedside to transport patient to facility. Patient in NAD at this time.  

## 2019-04-06 ENCOUNTER — Other Ambulatory Visit: Payer: Self-pay | Admitting: Internal Medicine

## 2019-04-10 ENCOUNTER — Ambulatory Visit: Payer: Medicare Other | Admitting: Internal Medicine

## 2019-04-10 ENCOUNTER — Encounter: Payer: Self-pay | Admitting: Internal Medicine

## 2019-04-10 ENCOUNTER — Ambulatory Visit (INDEPENDENT_AMBULATORY_CARE_PROVIDER_SITE_OTHER): Payer: Medicare Other | Admitting: Internal Medicine

## 2019-04-10 ENCOUNTER — Other Ambulatory Visit: Payer: Self-pay

## 2019-04-10 VITALS — BP 140/80 | HR 60 | Temp 97.4°F | Ht 66.0 in | Wt 98.0 lb

## 2019-04-10 DIAGNOSIS — Z0000 Encounter for general adult medical examination without abnormal findings: Secondary | ICD-10-CM | POA: Diagnosis not present

## 2019-04-10 DIAGNOSIS — Z23 Encounter for immunization: Secondary | ICD-10-CM

## 2019-04-10 MED ORDER — LOSARTAN POTASSIUM 100 MG PO TABS: 100.0000 mg | ORAL_TABLET | Freq: Every day | ORAL | 3 refills | Status: DC

## 2019-04-10 MED ORDER — METOPROLOL SUCCINATE ER 25 MG PO TB24: 50.0000 mg | ORAL_TABLET | Freq: Every day | ORAL | 3 refills | Status: DC

## 2019-04-10 MED ORDER — ACETAMINOPHEN 500 MG PO TABS: 500.0000 mg | ORAL_TABLET | Freq: Four times a day (QID) | ORAL | 2 refills | Status: DC | PRN

## 2019-04-10 NOTE — Patient Instructions (Signed)
Your right ear was irrigated of wax  You had the flu shot, and the Tdap tetanus shot today  Please continue all other medications as before, and refills have been done if requested.  Please have the pharmacy call with any other refills you may need.  Please continue your efforts at being more active, low cholesterol diet, and weight control.  You are otherwise up to date with prevention measures today.  Please keep your appointments with your specialists as you may have planned  Please return in 6 months, or sooner if needed

## 2019-04-10 NOTE — Progress Notes (Signed)
Subjective:    Patient ID: Alexis Cobb, female    DOB: 15-Nov-1924, 85 y.o.   MRN: [id number]  HPI Here for wellness and f/u;  Overall doing ok;  Pt denies Chest pain, worsening SOB, DOE, wheezing, orthopnea, PND, worsening LE edema, palpitations, dizziness or syncope.  Pt denies neurological change such as new headache, facial or extremity weakness.  Pt denies polydipsia, polyuria, or low sugar symptoms. Pt states overall good compliance with treatment and medications, good tolerability, and has been trying to follow appropriate diet.  Pt denies worsening depressive symptoms, suicidal ideation or panic. No fever, night sweats, wt loss, loss of appetite, or other constitutional symptoms.  Pt states good ability with ADL's, has low fall risk, home safety reviewed and adequate, no other significant changes in hearing or vision, and only occasionally active with exercise.  Denies urinary symptoms such as dysuria, frequency, urgency, flank pain, hematuria or n/v, fever, chills. Did have uti nov 2020 tx with cipro Past Medical History:  Diagnosis Date  . Arthritis   . ARTHRITIS, RHEUMATOID, HX OF 01/06/2007  . Cervical stenosis of spine   . HTN (hypertension)   . Hyperlipidemia   . MITRAL REGURGITATION 01/06/2007  . OSTEOARTHRITIS 01/14/2007  . OSTEOPOROSIS 01/14/2007  . Other specified forms of hearing loss 08/01/2009  . PACEMAKER, PERMANENT 01/06/2007  . Raynaud's syndrome 01/06/2007   Past Surgical History:  Procedure Laterality Date  . ABDOMINAL HYSTERECTOMY    . APPENDECTOMY    . CATARACT EXTRACTION, BILATERAL    . MITRAL VALVE REPAIR    . ovarian tumor, benign    . PACEMAKER INSERTION    . PPM GENERATOR CHANGEOUT N/A 02/04/2017   Procedure: PPM GENERATOR CHANGEOUT;  Surgeon: Marinus Maw, MD;  Location: [facility] INVASIVE CV LAB;  Service: Cardiovascular;  Laterality: N/A;    reports that she has never smoked. She has never used smokeless tobacco. She reports that she does not drink alcohol  or use drugs. family history includes Coronary artery disease in an other family member; Hypertension in an other family member. Allergies  Allergen Reactions  . Ace Inhibitors Rash  . Cephalexin Rash  . Penicillins Rash and Other (See Comments)    Has patient had a PCN reaction causing immediate rash, facial/tongue/throat swelling, SOB or lightheadedness with hypotension: No Has patient had a PCN reaction causing severe rash involving mucus membranes or skin necrosis: No Has patient had a PCN reaction that required hospitalization No Has patient had a PCN reaction occurring within the last 10 years: No If all of the above answers are "NO", then may proceed with Cephalosporin use.  Marland Kitchen Risedronate Sodium Rash   Current Outpatient Medications on File Prior to Visit  Medication Sig Dispense Refill  . Amino Acids-Protein Hydrolys (FEEDING SUPPLEMENT, PRO-STAT SUGAR FREE 64,) LIQD Take 30 mLs by mouth daily.    Marland Kitchen aspirin EC 81 MG tablet Take 81 mg by mouth at bedtime.    . Calcium Carbonate-Vitamin D (CALCIUM 600+D) 600-400 MG-UNIT tablet Take 1 tablet by mouth 2 (two) times daily.    . cholecalciferol (VITAMIN D) 1000 units tablet Take 1,000 Units by mouth daily.    . feeding supplement (BOOST / RESOURCE BREEZE) LIQD Take 237 mLs by mouth 2 (two) times daily.    . Multiple Vitamin (MULTIVITAMIN WITH MINERALS) TABS tablet Take 1 tablet by mouth daily.    . nitroGLYCERIN (NITROSTAT) 0.4 MG SL tablet TAKE 1 TAB SUBLINGUALLY EVERY 5 MINUTES FOR 3 DOSES AS NEEDED  FOR CHEST PAIN. IF NO RELIEF CALL MD. * NOT TO EXCEED 3TABS/24HRS* 25 tablet 10   No current facility-administered medications on file prior to visit.    Review of Systems Constitutional: Negative for other unusual diaphoresis, sweats, appetite or weight changes HENT: Negative for other worsening hearing loss, ear pain, facial swelling, mouth sores or neck stiffness.   Eyes: Negative for other worsening pain, redness or other visual  disturbance.  Respiratory: Negative for other stridor or swelling Cardiovascular: Negative for other palpitations or other chest pain  Gastrointestinal: Negative for worsening diarrhea or loose stools, blood in stool, distention or other pain Genitourinary: Negative for hematuria, flank pain or other change in urine volume.  Musculoskeletal: Negative for myalgias or other joint swelling.  Skin: Negative for other color change, or other wound or worsening drainage.  Neurological: Negative for other syncope or numbness. Hematological: Negative for other adenopathy or swelling Psychiatric/Behavioral: Negative for hallucinations, other worsening agitation, SI, self-injury, or new decreased concentration All otherwise neg per pt     Objective:   Physical Exam BP 140/80 (BP Location: Right Arm, Patient Position: Sitting, Cuff Size: Normal)   Pulse 60   Temp (!) 97.4 F (36.3 C) (Oral)   Ht 5\' 6"  (1.676 m)   Wt 98 lb (44.5 kg)   SpO2 99%   BMI 15.82 kg/m  VS noted,  Constitutional: Pt is oriented to person, place, and time. Appears well-developed and well-nourished, in no significant distress and comfortable Head: Normocephalic and atraumatic  Eyes: Conjunctivae and EOM are normal. Pupils are equal, round, and reactive to light Right Ear: External ear normal without discharge Left Ear: External ear normal without discharge Nose: Nose without discharge or deformity Mouth/Throat: Oropharynx is without other ulcerations and moist  Neck: Normal range of motion. Neck supple. No JVD present. No tracheal deviation present or significant neck LA or mass Cardiovascular: Normal rate, regular rhythm, normal heart sounds and intact distal pulses.   Pulmonary/Chest: WOB normal and breath sounds without rales or wheezing  Abdominal: Soft. Bowel sounds are normal. NT. No HSM  Musculoskeletal: Normal range of motion. Exhibits no edema Lymphadenopathy: Has no other cervical adenopathy.  Neurological:  Pt is alert and oriented to person, place, and time. Pt has normal reflexes. No cranial nerve deficit. Motor grossly intact, Gait intact Skin: Skin is warm and dry. No rash noted or new ulcerations Psychiatric:  Has normal mood and affect. Behavior is normal without agitation All otherwise neg per pt Lab Results  Component Value Date   WBC 8.4 03/28/2019   HGB 13.3 03/28/2019   HCT 41.2 03/28/2019   PLT 203 03/28/2019   GLUCOSE 96 03/28/2019   CHOL 133 03/21/2018   TRIG 111.0 03/21/2018   HDL 50.50 03/21/2018   LDLCALC 61 03/21/2018   ALT 14 03/28/2019   AST 27 03/28/2019   NA 144 03/28/2019   K 3.2 (L) 03/28/2019   CL 107 03/28/2019   CREATININE 1.06 (H) 03/28/2019   BUN 36 (H) 03/28/2019   CO2 27 03/28/2019   TSH 1.74 03/21/2018   INR 1.1 ratio (H) 08/29/2008      Assessment & Plan:

## 2019-04-10 NOTE — Progress Notes (Signed)
Patient consent obtained. Irrigation with water and peroxide performed. Full view of tympanic membranes after procedure.  Patient tolerated procedure well.   

## 2019-04-14 ENCOUNTER — Encounter: Payer: Self-pay | Admitting: Internal Medicine

## 2019-04-14 NOTE — Assessment & Plan Note (Signed)

## 2019-06-01 ENCOUNTER — Ambulatory Visit (INDEPENDENT_AMBULATORY_CARE_PROVIDER_SITE_OTHER): Payer: Medicare Other | Admitting: *Deleted

## 2019-06-01 DIAGNOSIS — I495 Sick sinus syndrome: Secondary | ICD-10-CM | POA: Diagnosis not present

## 2019-06-01 LAB — CUP PACEART REMOTE DEVICE CHECK
Battery Remaining Longevity: 75 mo
Battery Voltage: 2.97 V
Brady Statistic AP VP Percent: 48.04 %
Brady Statistic AP VS Percent: 0.01 %
Brady Statistic AS VP Percent: 51.48 %
Brady Statistic AS VS Percent: 0.48 %
Brady Statistic RA Percent Paced: 48.14 %
Brady Statistic RV Percent Paced: 99.52 %
Date Time Interrogation Session: 20210122011444
Implantable Lead Implant Date: 20020104
Implantable Lead Implant Date: 20020104
Implantable Lead Location: 753859
Implantable Lead Location: 753860
Implantable Lead Model: 5076
Implantable Pulse Generator Implant Date: 20180928
Lead Channel Impedance Value: 304 Ohm
Lead Channel Impedance Value: 342 Ohm
Lead Channel Impedance Value: 361 Ohm
Lead Channel Impedance Value: 418 Ohm
Lead Channel Pacing Threshold Amplitude: 0.625 V
Lead Channel Pacing Threshold Amplitude: 1.625 V
Lead Channel Pacing Threshold Pulse Width: 0.4 ms
Lead Channel Pacing Threshold Pulse Width: 0.4 ms
Lead Channel Sensing Intrinsic Amplitude: 1.125 mV
Lead Channel Sensing Intrinsic Amplitude: 1.125 mV
Lead Channel Sensing Intrinsic Amplitude: 1.625 mV
Lead Channel Sensing Intrinsic Amplitude: 1.625 mV
Lead Channel Setting Pacing Amplitude: 1.5 V
Lead Channel Setting Pacing Amplitude: 3.5 V
Lead Channel Setting Pacing Pulse Width: 0.4 ms
Lead Channel Setting Sensing Sensitivity: 0.9 mV

## 2019-06-06 DIAGNOSIS — B351 Tinea unguium: Secondary | ICD-10-CM | POA: Diagnosis not present

## 2019-06-06 DIAGNOSIS — L84 Corns and callosities: Secondary | ICD-10-CM | POA: Diagnosis not present

## 2019-06-06 DIAGNOSIS — I739 Peripheral vascular disease, unspecified: Secondary | ICD-10-CM | POA: Diagnosis not present

## 2019-06-06 DIAGNOSIS — M79673 Pain in unspecified foot: Secondary | ICD-10-CM | POA: Diagnosis not present

## 2019-08-08 ENCOUNTER — Ambulatory Visit: Payer: Medicare Other

## 2019-08-31 ENCOUNTER — Ambulatory Visit (INDEPENDENT_AMBULATORY_CARE_PROVIDER_SITE_OTHER): Payer: Medicare Other | Admitting: *Deleted

## 2019-08-31 DIAGNOSIS — I495 Sick sinus syndrome: Secondary | ICD-10-CM | POA: Diagnosis not present

## 2019-08-31 LAB — CUP PACEART REMOTE DEVICE CHECK
Battery Remaining Longevity: 69 mo
Battery Voltage: 2.97 V
Brady Statistic AP VP Percent: 45.02 %
Brady Statistic AP VS Percent: 0 %
Brady Statistic AS VP Percent: 54.79 %
Brady Statistic AS VS Percent: 0.18 %
Brady Statistic RA Percent Paced: 45 %
Brady Statistic RV Percent Paced: 99.7 %
Date Time Interrogation Session: 20210423021558
Implantable Lead Implant Date: 20020104
Implantable Lead Implant Date: 20020104
Implantable Lead Location: 753859
Implantable Lead Location: 753860
Implantable Lead Model: 5076
Implantable Pulse Generator Implant Date: 20180928
Lead Channel Impedance Value: 285 Ohm
Lead Channel Impedance Value: 323 Ohm
Lead Channel Impedance Value: 342 Ohm
Lead Channel Impedance Value: 380 Ohm
Lead Channel Pacing Threshold Amplitude: 0.625 V
Lead Channel Pacing Threshold Amplitude: 1.875 V
Lead Channel Pacing Threshold Pulse Width: 0.4 ms
Lead Channel Pacing Threshold Pulse Width: 0.4 ms
Lead Channel Sensing Intrinsic Amplitude: 0.875 mV
Lead Channel Sensing Intrinsic Amplitude: 0.875 mV
Lead Channel Sensing Intrinsic Amplitude: 2.125 mV
Lead Channel Sensing Intrinsic Amplitude: 2.125 mV
Lead Channel Setting Pacing Amplitude: 1.5 V
Lead Channel Setting Pacing Amplitude: 3.5 V
Lead Channel Setting Pacing Pulse Width: 0.4 ms
Lead Channel Setting Sensing Sensitivity: 0.9 mV

## 2019-08-31 NOTE — Progress Notes (Signed)
PPM Remote  

## 2019-10-18 ENCOUNTER — Telehealth: Payer: Self-pay | Admitting: Internal Medicine

## 2019-10-18 NOTE — Telephone Encounter (Signed)
New message:   Alexis Cobb is calling from [facility] to get a order faxed to do a UA and C&S order on the pt due to back and bladder discomfort. Please fax order to [phone]. Please advise.

## 2019-10-19 NOTE — Telephone Encounter (Signed)
Sent to Dr. John. 

## 2019-10-19 NOTE — Telephone Encounter (Signed)
Rx order has been faxed over.

## 2019-10-19 NOTE — Telephone Encounter (Signed)
Done hardcopy to Alexis Cobb 

## 2019-10-23 ENCOUNTER — Telehealth: Payer: Self-pay | Admitting: Internal Medicine

## 2019-10-23 ENCOUNTER — Other Ambulatory Visit: Payer: Self-pay

## 2019-10-23 MED ORDER — CIPROFLOXACIN HCL 500 MG PO TABS: 500.0000 mg | ORAL_TABLET | Freq: Two times a day (BID) | ORAL | 0 refills | Status: DC

## 2019-10-23 MED ORDER — CIPROFLOXACIN HCL 500 MG PO TABS: 500.0000 mg | ORAL_TABLET | Freq: Two times a day (BID) | ORAL | 0 refills | Status: AC

## 2019-10-23 NOTE — Telephone Encounter (Signed)
Ok to call pt and/or family -   Received UA results today c/w probable UTI  Ok for cipro - done erx to [facility] [address]  But need to verify this was correct pharmacy please and resend if not correct

## 2019-10-23 NOTE — Telephone Encounter (Signed)
Spoke with pts niece and she has informed me that the pt is in a Assisted living home. I contacted the facility in which it is [facility] and was able to obtain the pharmacy in which they use to send the pt medications over to.

## 2019-11-26 ENCOUNTER — Other Ambulatory Visit: Payer: Self-pay | Admitting: Internal Medicine

## 2019-11-30 ENCOUNTER — Ambulatory Visit (INDEPENDENT_AMBULATORY_CARE_PROVIDER_SITE_OTHER): Payer: Medicare Other | Admitting: *Deleted

## 2019-11-30 DIAGNOSIS — R001 Bradycardia, unspecified: Secondary | ICD-10-CM

## 2019-11-30 LAB — CUP PACEART REMOTE DEVICE CHECK
Battery Remaining Longevity: 64 mo
Battery Voltage: 2.96 V
Brady Statistic AP VP Percent: 57.09 %
Brady Statistic AP VS Percent: 0.06 %
Brady Statistic AS VP Percent: 42.08 %
Brady Statistic AS VS Percent: 0.77 %
Brady Statistic RA Percent Paced: 57.22 %
Brady Statistic RV Percent Paced: 99.17 %
Date Time Interrogation Session: 20210723021710
Implantable Lead Implant Date: 20020104
Implantable Lead Implant Date: 20020104
Implantable Lead Location: 753859
Implantable Lead Location: 753860
Implantable Lead Model: 5076
Implantable Pulse Generator Implant Date: 20180928
Lead Channel Impedance Value: 285 Ohm
Lead Channel Impedance Value: 323 Ohm
Lead Channel Impedance Value: 323 Ohm
Lead Channel Impedance Value: 380 Ohm
Lead Channel Pacing Threshold Amplitude: 0.625 V
Lead Channel Pacing Threshold Amplitude: 2.125 V
Lead Channel Pacing Threshold Pulse Width: 0.4 ms
Lead Channel Pacing Threshold Pulse Width: 0.4 ms
Lead Channel Sensing Intrinsic Amplitude: 0.75 mV
Lead Channel Sensing Intrinsic Amplitude: 0.75 mV
Lead Channel Sensing Intrinsic Amplitude: 2.875 mV
Lead Channel Sensing Intrinsic Amplitude: 2.875 mV
Lead Channel Setting Pacing Amplitude: 1.5 V
Lead Channel Setting Pacing Amplitude: 3.5 V
Lead Channel Setting Pacing Pulse Width: 0.4 ms
Lead Channel Setting Sensing Sensitivity: 0.9 mV

## 2019-12-03 NOTE — Progress Notes (Signed)
Remote pacemaker transmission.   

## 2019-12-04 DIAGNOSIS — M79673 Pain in unspecified foot: Secondary | ICD-10-CM | POA: Diagnosis not present

## 2019-12-04 DIAGNOSIS — I739 Peripheral vascular disease, unspecified: Secondary | ICD-10-CM | POA: Diagnosis not present

## 2019-12-04 DIAGNOSIS — B351 Tinea unguium: Secondary | ICD-10-CM | POA: Diagnosis not present

## 2019-12-04 DIAGNOSIS — L84 Corns and callosities: Secondary | ICD-10-CM | POA: Diagnosis not present

## 2020-01-09 ENCOUNTER — Telehealth: Payer: Self-pay | Admitting: Internal Medicine

## 2020-01-09 NOTE — Telephone Encounter (Signed)
New message:   Alexis Cobb is calling from [facility] and would like to know if we received the pt's UA report that was faxed over a few days ago. They would like to know if the Dr is going to prescribe some medication. Please advise.

## 2020-01-09 NOTE — Telephone Encounter (Signed)
Patient's daughter called to follow up as well. Waited to know what the hold up is and is someone going to call something in for the patient.

## 2020-01-11 NOTE — Telephone Encounter (Signed)
Follow up message  Patient's daughter calling for status of urine test results and prescription  Requesting call be made to [facility].

## 2020-01-15 ENCOUNTER — Other Ambulatory Visit: Payer: Self-pay | Admitting: Internal Medicine

## 2020-01-15 MED ORDER — CIPROFLOXACIN HCL 500 MG PO TABS: 500.0000 mg | ORAL_TABLET | Freq: Two times a day (BID) | ORAL | 0 refills | Status: AC

## 2020-01-15 NOTE — Telephone Encounter (Signed)
I was able to speak with the nurse Shaquima at [facility] and was able to get her fax.  **Dr. Jonny Ruiz has written a hard copy rx and it was sent to [facility] via fax at [phone].**

## 2020-01-15 NOTE — Telephone Encounter (Signed)
done hardcopy with UA c/w uti

## 2020-02-29 ENCOUNTER — Ambulatory Visit (INDEPENDENT_AMBULATORY_CARE_PROVIDER_SITE_OTHER): Payer: Medicare Other

## 2020-02-29 DIAGNOSIS — I495 Sick sinus syndrome: Secondary | ICD-10-CM | POA: Diagnosis not present

## 2020-03-01 LAB — CUP PACEART REMOTE DEVICE CHECK
Battery Remaining Longevity: 57 mo
Battery Voltage: 2.96 V
Brady Statistic AP VP Percent: 60.33 %
Brady Statistic AP VS Percent: 0.06 %
Brady Statistic AS VP Percent: 38.76 %
Brady Statistic AS VS Percent: 0.85 %
Brady Statistic RA Percent Paced: 60.44 %
Brady Statistic RV Percent Paced: 99.09 %
Date Time Interrogation Session: 20211022021618
Implantable Lead Implant Date: 20020104
Implantable Lead Implant Date: 20020104
Implantable Lead Location: 753859
Implantable Lead Location: 753860
Implantable Lead Model: 5076
Implantable Pulse Generator Implant Date: 20180928
Lead Channel Impedance Value: 266 Ohm
Lead Channel Impedance Value: 323 Ohm
Lead Channel Impedance Value: 323 Ohm
Lead Channel Impedance Value: 380 Ohm
Lead Channel Pacing Threshold Amplitude: 0.75 V
Lead Channel Pacing Threshold Amplitude: 2.125 V
Lead Channel Pacing Threshold Pulse Width: 0.4 ms
Lead Channel Pacing Threshold Pulse Width: 0.4 ms
Lead Channel Sensing Intrinsic Amplitude: 0.875 mV
Lead Channel Sensing Intrinsic Amplitude: 0.875 mV
Lead Channel Sensing Intrinsic Amplitude: 2.375 mV
Lead Channel Sensing Intrinsic Amplitude: 2.375 mV
Lead Channel Setting Pacing Amplitude: 1.5 V
Lead Channel Setting Pacing Amplitude: 3.5 V
Lead Channel Setting Pacing Pulse Width: 0.4 ms
Lead Channel Setting Sensing Sensitivity: 0.9 mV

## 2020-03-05 NOTE — Progress Notes (Signed)
Remote pacemaker transmission.   

## 2020-03-20 ENCOUNTER — Telehealth: Payer: Self-pay | Admitting: Internal Medicine

## 2020-03-20 NOTE — Telephone Encounter (Signed)
  Shakemia from [facility] calling to speak to someone to get the Physician Order Sheet signed #  [phone]

## 2020-05-05 ENCOUNTER — Telehealth: Payer: Self-pay | Admitting: Internal Medicine

## 2020-05-05 NOTE — Telephone Encounter (Signed)
Copied from CRM [id number]. Topic: Medicare AWV >> May 05, 2020  3:55 PM Claudette Laws R wrote: Reason for CRM:   No answer unable to leave message for patient to call back and schedule Medicare Annual Wellness Visit (AWV) in office.   If not able to come in office, please offer to do virtually.   No hx of AWV - AWV-I eligible as 05/10/2009  Please schedule at anytime with the Nurse Health Advisor.   45 minute appointment

## 2020-05-30 ENCOUNTER — Ambulatory Visit (INDEPENDENT_AMBULATORY_CARE_PROVIDER_SITE_OTHER): Payer: Medicare Other

## 2020-05-30 DIAGNOSIS — R001 Bradycardia, unspecified: Secondary | ICD-10-CM

## 2020-05-30 LAB — CUP PACEART REMOTE DEVICE CHECK
Battery Remaining Longevity: 52 mo
Battery Voltage: 2.95 V
Brady Statistic AP VP Percent: 48.05 %
Brady Statistic AP VS Percent: 0.05 %
Brady Statistic AS VP Percent: 51.27 %
Brady Statistic AS VS Percent: 0.63 %
Brady Statistic RA Percent Paced: 47.97 %
Brady Statistic RV Percent Paced: 99.32 %
Date Time Interrogation Session: 20220121011712
Implantable Lead Implant Date: 20020104
Implantable Lead Implant Date: 20020104
Implantable Lead Location: 753859
Implantable Lead Location: 753860
Implantable Lead Model: 5076
Implantable Pulse Generator Implant Date: 20180928
Lead Channel Impedance Value: 266 Ohm
Lead Channel Impedance Value: 304 Ohm
Lead Channel Impedance Value: 323 Ohm
Lead Channel Impedance Value: 361 Ohm
Lead Channel Pacing Threshold Amplitude: 0.625 V
Lead Channel Pacing Threshold Amplitude: 1.625 V
Lead Channel Pacing Threshold Pulse Width: 0.4 ms
Lead Channel Pacing Threshold Pulse Width: 0.4 ms
Lead Channel Sensing Intrinsic Amplitude: 0.625 mV
Lead Channel Sensing Intrinsic Amplitude: 0.625 mV
Lead Channel Sensing Intrinsic Amplitude: 2.375 mV
Lead Channel Sensing Intrinsic Amplitude: 2.375 mV
Lead Channel Setting Pacing Amplitude: 1.5 V
Lead Channel Setting Pacing Amplitude: 3.5 V
Lead Channel Setting Pacing Pulse Width: 0.4 ms
Lead Channel Setting Sensing Sensitivity: 0.9 mV

## 2020-06-11 NOTE — Progress Notes (Signed)
Remote pacemaker transmission.   

## 2020-06-18 ENCOUNTER — Telehealth: Payer: Self-pay | Admitting: Internal Medicine

## 2020-06-18 NOTE — Telephone Encounter (Signed)
I dont recall signing a fax request for this, but ok for verbal if that is ok -   UA and Culture - dysuria

## 2020-06-18 NOTE — Telephone Encounter (Signed)
Lurena Joiner from [facility] regarding the patient complaining of lower back pain, frequent urination, and painful urination    They sent something in the fax for a request of UA labs to be sent over to their labs   [phone]

## 2020-06-19 NOTE — Telephone Encounter (Signed)
Gave verbals to rebecca

## 2020-06-24 ENCOUNTER — Other Ambulatory Visit: Payer: Self-pay | Admitting: Internal Medicine

## 2020-06-24 MED ORDER — CIPROFLOXACIN HCL 500 MG PO TABS: 500.0000 mg | ORAL_TABLET | Freq: Two times a day (BID) | ORAL | 0 refills | Status: AC

## 2020-06-24 NOTE — Telephone Encounter (Signed)
I received fax of abnormal UA from [facility]  Not sure if this [facility] is the one in [place] or [employer]  I sent a rx for cipro antibiotic to [facility] [place] since this appeared to be the last pharmacy used  Staff to call pt daughter please and ask if this is correct,, or does antibiotic need to be sent somewhere else

## 2020-06-30 ENCOUNTER — Ambulatory Visit (INDEPENDENT_AMBULATORY_CARE_PROVIDER_SITE_OTHER): Payer: Medicare Other | Admitting: Internal Medicine

## 2020-06-30 ENCOUNTER — Other Ambulatory Visit: Payer: Self-pay

## 2020-06-30 ENCOUNTER — Encounter: Payer: Self-pay | Admitting: Internal Medicine

## 2020-06-30 VITALS — BP 112/64 | HR 70 | Temp 97.9°F | Ht 66.0 in | Wt 93.0 lb

## 2020-06-30 DIAGNOSIS — R1032 Left lower quadrant pain: Secondary | ICD-10-CM | POA: Diagnosis not present

## 2020-06-30 DIAGNOSIS — Z0001 Encounter for general adult medical examination with abnormal findings: Secondary | ICD-10-CM

## 2020-06-30 DIAGNOSIS — R410 Disorientation, unspecified: Secondary | ICD-10-CM | POA: Diagnosis not present

## 2020-06-30 DIAGNOSIS — N390 Urinary tract infection, site not specified: Secondary | ICD-10-CM | POA: Insufficient documentation

## 2020-06-30 DIAGNOSIS — I10 Essential (primary) hypertension: Secondary | ICD-10-CM | POA: Diagnosis not present

## 2020-06-30 DIAGNOSIS — H6123 Impacted cerumen, bilateral: Secondary | ICD-10-CM | POA: Insufficient documentation

## 2020-06-30 DIAGNOSIS — Z0000 Encounter for general adult medical examination without abnormal findings: Secondary | ICD-10-CM

## 2020-06-30 LAB — CBC WITH DIFFERENTIAL/PLATELET
Basophils Absolute: 0 10*3/uL (ref 0.0–0.1)
Basophils Relative: 0.6 % (ref 0.0–3.0)
Eosinophils Absolute: 0.2 10*3/uL (ref 0.0–0.7)
Eosinophils Relative: 2.3 % (ref 0.0–5.0)
HCT: 38.9 % (ref 36.0–46.0)
Hemoglobin: 12.9 g/dL (ref 12.0–15.0)
Lymphocytes Relative: 21.8 % (ref 12.0–46.0)
Lymphs Abs: 1.8 10*3/uL (ref 0.7–4.0)
MCHC: 33.2 g/dL (ref 30.0–36.0)
MCV: 99.3 fl (ref 78.0–100.0)
Monocytes Absolute: 0.6 10*3/uL (ref 0.1–1.0)
Monocytes Relative: 7.5 % (ref 3.0–12.0)
Neutro Abs: 5.7 10*3/uL (ref 1.4–7.7)
Neutrophils Relative %: 67.8 % (ref 43.0–77.0)
Platelets: 204 10*3/uL (ref 150.0–400.0)
RBC: 3.92 Mil/uL (ref 3.87–5.11)
RDW: 14.8 % (ref 11.5–15.5)
WBC: 8.5 10*3/uL (ref 4.0–10.5)

## 2020-06-30 LAB — HEPATIC FUNCTION PANEL
ALT: 11 U/L (ref 0–35)
AST: 28 U/L (ref 0–37)
Albumin: 3.9 g/dL (ref 3.5–5.2)
Alkaline Phosphatase: 51 U/L (ref 39–117)
Bilirubin, Direct: 0.1 mg/dL (ref 0.0–0.3)
Total Bilirubin: 0.5 mg/dL (ref 0.2–1.2)
Total Protein: 7 g/dL (ref 6.0–8.3)

## 2020-06-30 LAB — LIPID PANEL
Cholesterol: 155 mg/dL (ref 0–200)
HDL: 63.1 mg/dL (ref >39.00)
LDL Cholesterol: 72 mg/dL (ref 0–99)
NonHDL: 91.53
Total CHOL/HDL Ratio: 2
Triglycerides: 96 mg/dL (ref 0.0–149.0)
VLDL: 19.2 mg/dL (ref 0.0–40.0)

## 2020-06-30 LAB — BASIC METABOLIC PANEL
BUN: 34 mg/dL — ABNORMAL HIGH (ref 6–23)
CO2: 31 mEq/L (ref 19–32)
Calcium: 11.1 mg/dL — ABNORMAL HIGH (ref 8.4–10.5)
Chloride: 105 mEq/L (ref 96–112)
Creatinine, Ser: 1.53 mg/dL — ABNORMAL HIGH (ref 0.40–1.20)
GFR: 29.2 mL/min — ABNORMAL LOW (ref >60.00)
Glucose, Bld: 91 mg/dL (ref 70–99)
Potassium: 3.5 mEq/L (ref 3.5–5.1)
Sodium: 144 mEq/L (ref 135–145)

## 2020-06-30 LAB — TSH: TSH: 2.13 u[IU]/mL (ref 0.35–4.50)

## 2020-06-30 NOTE — Assessment & Plan Note (Signed)
Hearing improved with irrigation, cont to follow

## 2020-06-30 NOTE — Progress Notes (Signed)
.        Chief Complaint:: wellness exam and mild worsening confusion x 1 mo, UTi symptoms and chronic LLQ pain       HPI:  Alexis Cobb is a 87 y.o. female here for wellness exam with Annamarie Yamaguchi [facility]) niece.                          Also tolerating cipro for UTI symptoms onset 6 days ago, Denies urinary symptoms such as dysuria, frequency, urgency, flank pain, hematuria or n/v, fever, chills.  Has had some mild overall confusion it seems for 1 mo prior over baseline per famly with other specific focal neuro s/s.  Has also over 3 mo recurring LLQ pain, dull crampy without radiation, n/v or associated with overt GU or GI symptoms to which she attributes to a known renal stone that has not yet passed.  Also has bilateral ear hearing reduced with wax impactions, needs irrigated today   Wt Readings from Last 3 Encounters:  06/30/20 93 lb (42.2 kg)  04/10/19 98 lb (44.5 kg)  03/28/19 105 lb (47.6 kg)   BP Readings from Last 3 Encounters:  06/30/20 112/64  04/10/19 140/80  03/29/19 137/71   Immunization History  Administered Date(s) Administered  . Fluad Quad(high Dose 65+) 04/10/2019, 02/21/2020  . Influenza Whole 03/04/2009  . Influenza, High Dose Seasonal PF 02/20/2017, 03/21/2018  . Influenza,inj,Quad PF,6+ Mos 06/18/2014, 05/03/2016  . Influenza-Unspecified 01/08/2017  . Moderna Sars-Covid-2 Vaccination 09/10/2019, 10/08/2019, 03/23/2020  . Pneumococcal Conjugate-13 10/19/2016  . Pneumococcal Polysaccharide-23 07/02/2003  . Td 03/04/2009  . Tdap 04/10/2019   There are no preventive care reminders to display for this patient.    Past Medical History:  Diagnosis Date  . Arthritis   . ARTHRITIS, RHEUMATOID, HX OF 01/06/2007  . Cervical stenosis of spine   . HTN (hypertension)   . Hyperlipidemia   . MITRAL REGURGITATION 01/06/2007  . OSTEOARTHRITIS 01/14/2007  . OSTEOPOROSIS 01/14/2007  . Other specified forms of hearing loss 08/01/2009  . PACEMAKER, PERMANENT  01/06/2007  . Raynaud's syndrome 01/06/2007   Past Surgical History:  Procedure Laterality Date  . ABDOMINAL HYSTERECTOMY    . APPENDECTOMY    . CATARACT EXTRACTION, BILATERAL    . MITRAL VALVE REPAIR    . ovarian tumor, benign    . PACEMAKER INSERTION    . PPM GENERATOR CHANGEOUT N/A 02/04/2017   Procedure: PPM GENERATOR CHANGEOUT;  Surgeon: Marinus Maw, MD;  Location: [facility] INVASIVE CV LAB;  Service: Cardiovascular;  Laterality: N/A;    reports that she has never smoked. She has never used smokeless tobacco. She reports that she does not drink alcohol and does not use drugs. family history includes Coronary artery disease in an other family member; Hypertension in an other family member. Allergies  Allergen Reactions  . Ace Inhibitors Rash  . Cephalexin Rash  . Penicillins Rash and Other (See Comments)    Has patient had a PCN reaction causing immediate rash, facial/tongue/throat swelling, SOB or lightheadedness with hypotension: No Has patient had a PCN reaction causing severe rash involving mucus membranes or skin necrosis: No Has patient had a PCN reaction that required hospitalization No Has patient had a PCN reaction occurring within the last 10 years: No If all of the above answers are "NO", then may proceed with Cephalosporin use.  Marland Kitchen Risedronate Sodium Rash   Current Outpatient Medications on File Prior to Visit  Medication Sig Dispense  Refill  . acetaminophen (TYLENOL) 500 MG tablet Take 1 tablet (500 mg total) by mouth every 6 (six) hours as needed. 100 tablet 2  . Amino Acids-Protein Hydrolys (FEEDING SUPPLEMENT, PRO-STAT SUGAR FREE 64,) LIQD Take 30 mLs by mouth daily.    Marland Kitchen aspirin EC 81 MG tablet Take 81 mg by mouth at bedtime.    . ASPIRIN LOW DOSE 81 MG chewable tablet GIVE 1 TAB BY MOUTH AT BEDTIME FOR CIRCULATION 30 tablet 9  . Calcium Carbonate-Vitamin D 600-400 MG-UNIT tablet Take 1 tablet by mouth 2 (two) times daily.    . cholecalciferol (VITAMIN D) 1000 units  tablet Take 1,000 Units by mouth daily.    . ciprofloxacin (CIPRO) 500 MG tablet Take 1 tablet (500 mg total) by mouth 2 (two) times daily for 10 days. 20 tablet 0  . feeding supplement (BOOST / RESOURCE BREEZE) LIQD Take 237 mLs by mouth 2 (two) times daily.    . furosemide (LASIX) 20 MG tablet Take 20 mg by mouth daily.    Marland Kitchen losartan (COZAAR) 100 MG tablet Take 1 tablet (100 mg total) by mouth daily. Take 100 mg by mouth. 90 tablet 3  . lovastatin (MEVACOR) 40 MG tablet Take 40 mg by mouth daily.    . metoprolol succinate (TOPROL-XL) 25 MG 24 hr tablet Take 2 tablets (50 mg total) by mouth daily. Take with or immediately following a meal. 90 tablet 3  . nitroGLYCERIN (NITROSTAT) 0.4 MG SL tablet TAKE 1 TAB SUBLINGUALLY EVERY 5 MINUTES FOR 3 DOSES AS NEEDED FOR CHEST PAIN. IF NO RELIEF CALL MD. * NOT TO EXCEED 3TABS/24HRS* 25 tablet 10  . Multiple Vitamin (MULTIVITAMIN WITH MINERALS) TABS tablet Take 1 tablet by mouth daily. (Patient not taking: Reported on 06/30/2020)     No current facility-administered medications on file prior to visit.        ROS:  All others reviewed and negative.  Objective        PE:  BP 112/64   Pulse 70   Temp 97.9 F (36.6 C) (Oral)   Ht  (1.676 m)   Wt 93 lb (42.2 kg)   SpO2 98%   BMI 15.01 kg/m                 Constitutional: Pt appears in NAD               HENT: Head: NCAT.                Right Ear: External ear normal.                 Left Ear: External ear normal.                Eyes: . Pupils are equal, round, and reactive to light. Conjunctivae and EOM are normal               Nose: without d/c or deformity               Neck: Neck supple. Gross normal ROM               Cardiovascular: Normal rate and regular rhythm.                 Pulmonary/Chest: Effort normal and breath sounds without rales or wheezing.                Abd:  Soft, NT, ND, + BS, no organomegaly  Neurological: Pt is alert. At baseline orientation, motor grossly  intact               Skin: Skin is warm. No rashes, no other new lesions, LE edema - none               Psychiatric: Pt behavior is normal without agitation   Micro: none  Cardiac tracings I have personally interpreted today:  none  Pertinent Radiological findings (summarize): none   Lab Results  Component Value Date   WBC 8.5 06/30/2020   HGB 12.9 06/30/2020   HCT 38.9 06/30/2020   PLT 204.0 06/30/2020   GLUCOSE 91 06/30/2020   CHOL 155 06/30/2020   TRIG 96.0 06/30/2020   HDL 63.10 06/30/2020   LDLCALC 72 06/30/2020   ALT 11 06/30/2020   AST 28 06/30/2020   NA 144 06/30/2020   K 3.5 06/30/2020   CL 105 06/30/2020   CREATININE 1.53 (H) 06/30/2020   BUN 34 (H) 06/30/2020   CO2 31 06/30/2020   TSH 2.13 06/30/2020   INR 1.1 ratio (H) 08/29/2008   Assessment/Plan:  Alexis Cobb is a 87 y.o. White or Caucasian [1] female with  has a past medical history of Arthritis, ARTHRITIS, RHEUMATOID, HX OF (01/06/2007), Cervical stenosis of spine, HTN (hypertension), Hyperlipidemia, MITRAL REGURGITATION (01/06/2007), OSTEOARTHRITIS (01/14/2007), OSTEOPOROSIS (01/14/2007), Other specified forms of hearing loss (08/01/2009), PACEMAKER, PERMANENT (01/06/2007), and Raynaud's syndrome (01/06/2007).  Encounter for well adult exam with abnormal findings Age and sex appropriate education and counseling updated with regular exercise and diet Referrals for preventative services - declines dxa  Immunizations addressed - none needed Smoking counseling  - none needed Evidence for depression or other mood disorder - none significant Most recent labs reviewed. I have personally reviewed and have noted: 1) the patient's medical and social history 2) The patient's current medications and supplements 3) The patient's height, weight, and BMI have been recorded in the chart   Essential hypertension BP Readings from Last 3 Encounters:  06/30/20 112/64  04/10/19 140/80  03/29/19 137/71   Stable, pt to  continue medical treatment  - losartan, toprol   Current Outpatient Medications (Cardiovascular):  .  furosemide (LASIX) 20 MG tablet, Take 20 mg by mouth daily. Marland Kitchen  losartan (COZAAR) 100 MG tablet, Take 1 tablet (100 mg total) by mouth daily. Take 100 mg by mouth. .  lovastatin (MEVACOR) 40 MG tablet, Take 40 mg by mouth daily. .  metoprolol succinate (TOPROL-XL) 25 MG 24 hr tablet, Take 2 tablets (50 mg total) by mouth daily. Take with or immediately following a meal. .  nitroGLYCERIN (NITROSTAT) 0.4 MG SL tablet, TAKE 1 TAB SUBLINGUALLY EVERY 5 MINUTES FOR 3 DOSES AS NEEDED FOR CHEST PAIN. IF NO RELIEF CALL MD. * NOT TO EXCEED 3TABS/24HRS*   Current Outpatient Medications (Analgesics):  .  acetaminophen (TYLENOL) 500 MG tablet, Take 1 tablet (500 mg total) by mouth every 6 (six) hours as needed. Marland Kitchen  aspirin EC 81 MG tablet, Take 81 mg by mouth at bedtime. .  ASPIRIN LOW DOSE 81 MG chewable tablet, GIVE 1 TAB BY MOUTH AT BEDTIME FOR CIRCULATION   Current Outpatient Medications (Other):  Marland Kitchen  Amino Acids-Protein Hydrolys (FEEDING SUPPLEMENT, PRO-STAT SUGAR FREE 64,) LIQD, Take 30 mLs by mouth daily. .  Calcium Carbonate-Vitamin D 600-400 MG-UNIT tablet, Take 1 tablet by mouth 2 (two) times daily. .  cholecalciferol (VITAMIN D) 1000 units tablet, Take 1,000 Units by mouth daily. .  ciprofloxacin (CIPRO) 500  MG tablet, Take 1 tablet (500 mg total) by mouth 2 (two) times daily for 10 days. .  feeding supplement (BOOST / RESOURCE BREEZE) LIQD, Take 237 mLs by mouth 2 (two) times daily. .  Multiple Vitamin (MULTIVITAMIN WITH MINERALS) TABS tablet, Take 1 tablet by mouth daily. (Patient not taking: Reported on 06/30/2020)   Confusion Recent worsening, c/w mild cognitive impairment most likely, for labs as ordered, declines MRI and neurology for now  LLQ pain Chronic recurrent, for lab evalution, exam benign, ok to follow for now  UTI (urinary tract infection) Recent tx with cipro, improving,  to finish antibx and follow  Impacted cerumen of both ears Hearing improved with irrigation, cont to follow  Followup: Return in about 6 months (around 12/28/2020).  Oliver Barre, MD 06/30/2020 7:39 PM [facility]

## 2020-06-30 NOTE — Patient Instructions (Signed)

## 2020-06-30 NOTE — Assessment & Plan Note (Signed)
Chronic recurrent, for lab evalution, exam benign, ok to follow for now

## 2020-06-30 NOTE — Assessment & Plan Note (Addendum)
Age and sex appropriate education and counseling updated with regular exercise and diet Referrals for preventative services - declines dxa  Immunizations addressed - none needed Smoking counseling  - none needed Evidence for depression or other mood disorder - none significant Most recent labs reviewed. I have personally reviewed and have noted: 1) the patient's medical and social history 2) The patient's current medications and supplements 3) The patient's height, weight, and BMI have been recorded in the chart

## 2020-06-30 NOTE — Assessment & Plan Note (Signed)
Recent worsening, c/w mild cognitive impairment most likely, for labs as ordered, declines MRI and neurology for now

## 2020-06-30 NOTE — Assessment & Plan Note (Signed)
Recent tx with cipro, improving, to finish antibx and follow

## 2020-06-30 NOTE — Assessment & Plan Note (Signed)
BP Readings from Last 3 Encounters:  06/30/20 112/64  04/10/19 140/80  03/29/19 137/71   Stable, pt to continue medical treatment  - losartan, toprol   Current Outpatient Medications (Cardiovascular):  .  furosemide (LASIX) 20 MG tablet, Take 20 mg by mouth daily. Marland Kitchen  losartan (COZAAR) 100 MG tablet, Take 1 tablet (100 mg total) by mouth daily. Take 100 mg by mouth. .  lovastatin (MEVACOR) 40 MG tablet, Take 40 mg by mouth daily. .  metoprolol succinate (TOPROL-XL) 25 MG 24 hr tablet, Take 2 tablets (50 mg total) by mouth daily. Take with or immediately following a meal. .  nitroGLYCERIN (NITROSTAT) 0.4 MG SL tablet, TAKE 1 TAB SUBLINGUALLY EVERY 5 MINUTES FOR 3 DOSES AS NEEDED FOR CHEST PAIN. IF NO RELIEF CALL MD. * NOT TO EXCEED 3TABS/24HRS*   Current Outpatient Medications (Analgesics):  .  acetaminophen (TYLENOL) 500 MG tablet, Take 1 tablet (500 mg total) by mouth every 6 (six) hours as needed. Marland Kitchen  aspirin EC 81 MG tablet, Take 81 mg by mouth at bedtime. .  ASPIRIN LOW DOSE 81 MG chewable tablet, GIVE 1 TAB BY MOUTH AT BEDTIME FOR CIRCULATION   Current Outpatient Medications (Other):  Marland Kitchen  Amino Acids-Protein Hydrolys (FEEDING SUPPLEMENT, PRO-STAT SUGAR FREE 64,) LIQD, Take 30 mLs by mouth daily. .  Calcium Carbonate-Vitamin D 600-400 MG-UNIT tablet, Take 1 tablet by mouth 2 (two) times daily. .  cholecalciferol (VITAMIN D) 1000 units tablet, Take 1,000 Units by mouth daily. .  ciprofloxacin (CIPRO) 500 MG tablet, Take 1 tablet (500 mg total) by mouth 2 (two) times daily for 10 days. .  feeding supplement (BOOST / RESOURCE BREEZE) LIQD, Take 237 mLs by mouth 2 (two) times daily. .  Multiple Vitamin (MULTIVITAMIN WITH MINERALS) TABS tablet, Take 1 tablet by mouth daily. (Patient not taking: Reported on 06/30/2020)

## 2020-08-14 DIAGNOSIS — R2689 Other abnormalities of gait and mobility: Secondary | ICD-10-CM | POA: Diagnosis not present

## 2020-08-14 DIAGNOSIS — M6281 Muscle weakness (generalized): Secondary | ICD-10-CM | POA: Diagnosis not present

## 2020-08-14 DIAGNOSIS — R2681 Unsteadiness on feet: Secondary | ICD-10-CM | POA: Diagnosis not present

## 2020-08-15 DIAGNOSIS — R2689 Other abnormalities of gait and mobility: Secondary | ICD-10-CM | POA: Diagnosis not present

## 2020-08-15 DIAGNOSIS — R2681 Unsteadiness on feet: Secondary | ICD-10-CM | POA: Diagnosis not present

## 2020-08-15 DIAGNOSIS — M6281 Muscle weakness (generalized): Secondary | ICD-10-CM | POA: Diagnosis not present

## 2020-08-18 DIAGNOSIS — M6281 Muscle weakness (generalized): Secondary | ICD-10-CM | POA: Diagnosis not present

## 2020-08-18 DIAGNOSIS — R2689 Other abnormalities of gait and mobility: Secondary | ICD-10-CM | POA: Diagnosis not present

## 2020-08-18 DIAGNOSIS — R2681 Unsteadiness on feet: Secondary | ICD-10-CM | POA: Diagnosis not present

## 2020-08-19 DIAGNOSIS — M1990 Unspecified osteoarthritis, unspecified site: Secondary | ICD-10-CM | POA: Diagnosis not present

## 2020-08-19 DIAGNOSIS — N390 Urinary tract infection, site not specified: Secondary | ICD-10-CM | POA: Diagnosis not present

## 2020-08-19 DIAGNOSIS — Z950 Presence of cardiac pacemaker: Secondary | ICD-10-CM | POA: Diagnosis not present

## 2020-08-19 DIAGNOSIS — M4802 Spinal stenosis, cervical region: Secondary | ICD-10-CM | POA: Diagnosis not present

## 2020-08-19 DIAGNOSIS — I7300 Raynaud's syndrome without gangrene: Secondary | ICD-10-CM | POA: Diagnosis not present

## 2020-08-19 DIAGNOSIS — M810 Age-related osteoporosis without current pathological fracture: Secondary | ICD-10-CM | POA: Diagnosis not present

## 2020-08-19 DIAGNOSIS — E785 Hyperlipidemia, unspecified: Secondary | ICD-10-CM | POA: Diagnosis not present

## 2020-08-19 DIAGNOSIS — R32 Unspecified urinary incontinence: Secondary | ICD-10-CM | POA: Diagnosis not present

## 2020-08-19 DIAGNOSIS — I10 Essential (primary) hypertension: Secondary | ICD-10-CM | POA: Diagnosis not present

## 2020-08-19 DIAGNOSIS — H918X9 Other specified hearing loss, unspecified ear: Secondary | ICD-10-CM | POA: Diagnosis not present

## 2020-08-19 DIAGNOSIS — I051 Rheumatic mitral insufficiency: Secondary | ICD-10-CM | POA: Diagnosis not present

## 2020-08-19 DIAGNOSIS — M069 Rheumatoid arthritis, unspecified: Secondary | ICD-10-CM | POA: Diagnosis not present

## 2020-08-19 DIAGNOSIS — L89152 Pressure ulcer of sacral region, stage 2: Secondary | ICD-10-CM | POA: Diagnosis not present

## 2020-08-19 DIAGNOSIS — H6123 Impacted cerumen, bilateral: Secondary | ICD-10-CM | POA: Diagnosis not present

## 2020-08-20 DIAGNOSIS — R2681 Unsteadiness on feet: Secondary | ICD-10-CM | POA: Diagnosis not present

## 2020-08-20 DIAGNOSIS — R2689 Other abnormalities of gait and mobility: Secondary | ICD-10-CM | POA: Diagnosis not present

## 2020-08-20 DIAGNOSIS — M6281 Muscle weakness (generalized): Secondary | ICD-10-CM | POA: Diagnosis not present

## 2020-08-25 DIAGNOSIS — R2681 Unsteadiness on feet: Secondary | ICD-10-CM | POA: Diagnosis not present

## 2020-08-25 DIAGNOSIS — M6281 Muscle weakness (generalized): Secondary | ICD-10-CM | POA: Diagnosis not present

## 2020-08-26 DIAGNOSIS — N390 Urinary tract infection, site not specified: Secondary | ICD-10-CM | POA: Diagnosis not present

## 2020-08-26 DIAGNOSIS — M810 Age-related osteoporosis without current pathological fracture: Secondary | ICD-10-CM

## 2020-08-26 DIAGNOSIS — M4802 Spinal stenosis, cervical region: Secondary | ICD-10-CM

## 2020-08-26 DIAGNOSIS — H918X9 Other specified hearing loss, unspecified ear: Secondary | ICD-10-CM

## 2020-08-26 DIAGNOSIS — M069 Rheumatoid arthritis, unspecified: Secondary | ICD-10-CM

## 2020-08-26 DIAGNOSIS — Z950 Presence of cardiac pacemaker: Secondary | ICD-10-CM

## 2020-08-26 DIAGNOSIS — E785 Hyperlipidemia, unspecified: Secondary | ICD-10-CM

## 2020-08-26 DIAGNOSIS — I10 Essential (primary) hypertension: Secondary | ICD-10-CM | POA: Diagnosis not present

## 2020-08-26 DIAGNOSIS — H6123 Impacted cerumen, bilateral: Secondary | ICD-10-CM

## 2020-08-26 DIAGNOSIS — R32 Unspecified urinary incontinence: Secondary | ICD-10-CM

## 2020-08-26 DIAGNOSIS — L89152 Pressure ulcer of sacral region, stage 2: Secondary | ICD-10-CM | POA: Diagnosis not present

## 2020-08-26 DIAGNOSIS — I051 Rheumatic mitral insufficiency: Secondary | ICD-10-CM | POA: Diagnosis not present

## 2020-08-26 DIAGNOSIS — I7300 Raynaud's syndrome without gangrene: Secondary | ICD-10-CM

## 2020-08-26 DIAGNOSIS — M1990 Unspecified osteoarthritis, unspecified site: Secondary | ICD-10-CM

## 2020-08-28 ENCOUNTER — Telehealth: Payer: Self-pay | Admitting: Emergency Medicine

## 2020-08-28 NOTE — Telephone Encounter (Signed)
Lurena Joiner from [facility] called stating they faxed over an FL2 so the patient could be moved to the Memory Care Unit. They are asking that it be faxed back to them by tomorrow at the latest so they can get the patient moved. Thanks.

## 2020-08-29 ENCOUNTER — Ambulatory Visit (INDEPENDENT_AMBULATORY_CARE_PROVIDER_SITE_OTHER): Payer: Medicare Other

## 2020-08-29 DIAGNOSIS — R001 Bradycardia, unspecified: Secondary | ICD-10-CM

## 2020-08-29 NOTE — Telephone Encounter (Signed)
Fax complete

## 2020-08-30 LAB — CUP PACEART REMOTE DEVICE CHECK
Battery Remaining Longevity: 46 mo
Battery Voltage: 2.95 V
Brady Statistic AP VP Percent: 46.33 %
Brady Statistic AP VS Percent: 0.02 %
Brady Statistic AS VP Percent: 53.26 %
Brady Statistic AS VS Percent: 0.39 %
Brady Statistic RA Percent Paced: 46.3 %
Brady Statistic RV Percent Paced: 99.59 %
Date Time Interrogation Session: 20220422021607
Implantable Lead Implant Date: 20020104
Implantable Lead Implant Date: 20020104
Implantable Lead Location: 753859
Implantable Lead Location: 753860
Implantable Lead Model: 5076
Implantable Pulse Generator Implant Date: 20180928
Lead Channel Impedance Value: 266 Ohm
Lead Channel Impedance Value: 323 Ohm
Lead Channel Impedance Value: 323 Ohm
Lead Channel Impedance Value: 380 Ohm
Lead Channel Pacing Threshold Amplitude: 0.625 V
Lead Channel Pacing Threshold Amplitude: 2.125 V
Lead Channel Pacing Threshold Pulse Width: 0.4 ms
Lead Channel Pacing Threshold Pulse Width: 0.4 ms
Lead Channel Sensing Intrinsic Amplitude: 0.625 mV
Lead Channel Sensing Intrinsic Amplitude: 0.625 mV
Lead Channel Sensing Intrinsic Amplitude: 2 mV
Lead Channel Sensing Intrinsic Amplitude: 2 mV
Lead Channel Setting Pacing Amplitude: 1.5 V
Lead Channel Setting Pacing Amplitude: 3.5 V
Lead Channel Setting Pacing Pulse Width: 0.4 ms
Lead Channel Setting Sensing Sensitivity: 0.9 mV

## 2020-09-05 ENCOUNTER — Encounter: Payer: Self-pay | Admitting: Internal Medicine

## 2020-09-05 ENCOUNTER — Other Ambulatory Visit: Payer: Self-pay

## 2020-09-05 ENCOUNTER — Ambulatory Visit (INDEPENDENT_AMBULATORY_CARE_PROVIDER_SITE_OTHER): Payer: Medicare Other | Admitting: Internal Medicine

## 2020-09-05 VITALS — BP 118/70 | HR 58 | Temp 97.6°F | Ht 66.0 in | Wt 93.6 lb

## 2020-09-05 DIAGNOSIS — N179 Acute kidney failure, unspecified: Secondary | ICD-10-CM | POA: Diagnosis not present

## 2020-09-05 DIAGNOSIS — L89159 Pressure ulcer of sacral region, unspecified stage: Secondary | ICD-10-CM | POA: Diagnosis not present

## 2020-09-05 DIAGNOSIS — N189 Chronic kidney disease, unspecified: Secondary | ICD-10-CM | POA: Insufficient documentation

## 2020-09-05 DIAGNOSIS — Z7189 Other specified counseling: Secondary | ICD-10-CM

## 2020-09-05 DIAGNOSIS — N1831 Chronic kidney disease, stage 3a: Secondary | ICD-10-CM | POA: Diagnosis not present

## 2020-09-05 DIAGNOSIS — F0390 Unspecified dementia without behavioral disturbance: Secondary | ICD-10-CM | POA: Diagnosis not present

## 2020-09-05 LAB — BASIC METABOLIC PANEL
BUN: 58 mg/dL — ABNORMAL HIGH (ref 6–23)
CO2: 32 mEq/L (ref 19–32)
Calcium: 10.8 mg/dL — ABNORMAL HIGH (ref 8.4–10.5)
Chloride: 101 mEq/L (ref 96–112)
Creatinine, Ser: 1.42 mg/dL — ABNORMAL HIGH (ref 0.40–1.20)
GFR: 31.89 mL/min — ABNORMAL LOW (ref >60.00)
Glucose, Bld: 83 mg/dL (ref 70–99)
Potassium: 4.2 mEq/L (ref 3.5–5.1)
Sodium: 141 mEq/L (ref 135–145)

## 2020-09-05 NOTE — Progress Notes (Signed)
Patient ID: Alexis Cobb, female   DOB: 05/16/1924, 87 y.o.   MRN: [id number]        Chief Complaint:  Required face to face for sacral decub       HPI:  Alexis Cobb is a 87 y.o. female here with above with daughter, has known sacral decub with at least mild pain but no fever, drainage.  Pt now today moving to memory care part of [facility] but will need wound care orders for the agency.  Pt has no worsening behavioral issues. No recent falls.  Does not drink as much as duaghter would like but Pt denies chest pain, increased sob or doe, wheezing, orthopnea, PND, increased LE swelling, palpitations, dizziness or syncope.   Pt denies polydipsia, polyuria, or new focal neuro s/s.  Pt and daughter both agree to out of hospital DNR form signed today to avoid any heroic actions per EMS should they need to be called.  They do not want CPR, defibrillation, epi or intubation.  Denies urinary symptoms such as dysuria, frequency, urgency, flank pain, hematuria or n/v, fever, chills.  No ST, cough or wheezing.  No other new complaints       Wt Readings from Last 3 Encounters:  09/05/20 93 lb 9.6 oz (42.5 kg)  06/30/20 93 lb (42.2 kg)  04/10/19 98 lb (44.5 kg)   BP Readings from Last 3 Encounters:  09/05/20 118/70  06/30/20 112/64  04/10/19 140/80         Past Medical History:  Diagnosis Date  . Arthritis   . ARTHRITIS, RHEUMATOID, HX OF 01/06/2007  . Cervical stenosis of spine   . HTN (hypertension)   . Hyperlipidemia   . MITRAL REGURGITATION 01/06/2007  . OSTEOARTHRITIS 01/14/2007  . OSTEOPOROSIS 01/14/2007  . Other specified forms of hearing loss 08/01/2009  . PACEMAKER, PERMANENT 01/06/2007  . Raynaud's syndrome 01/06/2007   Past Surgical History:  Procedure Laterality Date  . ABDOMINAL HYSTERECTOMY    . APPENDECTOMY    . CATARACT EXTRACTION, BILATERAL    . MITRAL VALVE REPAIR    . ovarian tumor, benign    . PACEMAKER INSERTION    . PPM GENERATOR CHANGEOUT N/A 02/04/2017    Procedure: PPM GENERATOR CHANGEOUT;  Surgeon: Marinus Maw, MD;  Location: [facility] INVASIVE CV LAB;  Service: Cardiovascular;  Laterality: N/A;    reports that she has never smoked. She has never used smokeless tobacco. She reports that she does not drink alcohol and does not use drugs. family history includes Coronary artery disease in an other family member; Hypertension in an other family member. Allergies  Allergen Reactions  . Ace Inhibitors Rash  . Cephalexin Rash  . Penicillins Rash and Other (See Comments)    Has patient had a PCN reaction causing immediate rash, facial/tongue/throat swelling, SOB or lightheadedness with hypotension: No Has patient had a PCN reaction causing severe rash involving mucus membranes or skin necrosis: No Has patient had a PCN reaction that required hospitalization No Has patient had a PCN reaction occurring within the last 10 years: No If all of the above answers are "NO", then may proceed with Cephalosporin use.  Marland Kitchen Risedronate Sodium Rash   Current Outpatient Medications on File Prior to Visit  Medication Sig Dispense Refill  . acetaminophen (TYLENOL) 500 MG tablet Take 1 tablet (500 mg total) by mouth every 6 (six) hours as needed. 100 tablet 2  . Amino Acids-Protein Hydrolys (FEEDING SUPPLEMENT, PRO-STAT SUGAR FREE 64,) LIQD Take 30  mLs by mouth daily.    Marland Kitchen aspirin EC 81 MG tablet Take 81 mg by mouth at bedtime.    . ASPIRIN LOW DOSE 81 MG chewable tablet GIVE 1 TAB BY MOUTH AT BEDTIME FOR CIRCULATION 30 tablet 9  . Calcium Carbonate-Vitamin D 600-400 MG-UNIT tablet Take 1 tablet by mouth 2 (two) times daily.    . cholecalciferol (VITAMIN D) 1000 units tablet Take 1,000 Units by mouth daily.    . feeding supplement (BOOST / RESOURCE BREEZE) LIQD Take 237 mLs by mouth 2 (two) times daily.    . furosemide (LASIX) 20 MG tablet Take 20 mg by mouth daily.    Marland Kitchen losartan (COZAAR) 100 MG tablet Take 1 tablet (100 mg total) by mouth daily. Take 100 mg by mouth.  90 tablet 3  . lovastatin (MEVACOR) 40 MG tablet Take 40 mg by mouth daily.    . metoprolol succinate (TOPROL-XL) 25 MG 24 hr tablet Take 2 tablets (50 mg total) by mouth daily. Take with or immediately following a meal. 90 tablet 3  . Multiple Vitamin (MULTIVITAMIN WITH MINERALS) TABS tablet Take 1 tablet by mouth daily.    . nitroGLYCERIN (NITROSTAT) 0.4 MG SL tablet TAKE 1 TAB SUBLINGUALLY EVERY 5 MINUTES FOR 3 DOSES AS NEEDED FOR CHEST PAIN. IF NO RELIEF CALL MD. * NOT TO EXCEED 3TABS/24HRS* 25 tablet 10   No current facility-administered medications on file prior to visit.        ROS:  All others reviewed and negative.  Objective        PE:  BP 118/70 (BP Location: Right Arm, Patient Position: Sitting, Cuff Size: Normal)   Pulse (!) 58   Temp 97.6 F (36.4 C) (Oral)   Ht 5\' 6"  (1.676 m)   Wt 93 lb 9.6 oz (42.5 kg)   SpO2 97%   BMI 15.11 kg/m                 Constitutional: Pt appears in NAD               HENT: Head: NCAT.                Right Ear: External ear normal.                 Left Ear: External ear normal.                Eyes: . Pupils are equal, round, and reactive to light. Conjunctivae and EOM are normal               Nose: without d/c or deformity               Neck: Neck supple. Gross normal ROM               Cardiovascular: Normal rate and regular rhythm.                 Pulmonary/Chest: Effort normal and breath sounds without rales or wheezing.                Abd:  Soft, NT, ND, + BS, no organomegaly               Neurological: Pt is alert. At baseline orientation, motor grossly intact               Skin: Skin is warm. No rashes, no other new lesions, LE edema - none  Psychiatric: Pt behavior is normal without agitation   Micro: none  Cardiac tracings I have personally interpreted today:  none  Pertinent Radiological findings (summarize): none   Lab Results  Component Value Date   WBC 8.5 06/30/2020   HGB 12.9 06/30/2020   HCT 38.9  06/30/2020   PLT 204.0 06/30/2020   GLUCOSE 83 09/05/2020   CHOL 155 06/30/2020   TRIG 96.0 06/30/2020   HDL 63.10 06/30/2020   LDLCALC 72 06/30/2020   ALT 11 06/30/2020   AST 28 06/30/2020   NA 141 09/05/2020   K 4.2 09/05/2020   CL 101 09/05/2020   CREATININE 1.42 (H) 09/05/2020   BUN 58 (H) 09/05/2020   CO2 32 09/05/2020   TSH 2.13 06/30/2020   INR 1.1 ratio (H) 08/29/2008   Assessment/Plan:  COBIE LEIDNER is a 87 y.o. White or Caucasian [1] female with  has a past medical history of Arthritis, ARTHRITIS, RHEUMATOID, HX OF (01/06/2007), Cervical stenosis of spine, HTN (hypertension), Hyperlipidemia, MITRAL REGURGITATION (01/06/2007), OSTEOARTHRITIS (01/14/2007), OSTEOPOROSIS (01/14/2007), Other specified forms of hearing loss (08/01/2009), PACEMAKER, PERMANENT (01/06/2007), and Raynaud's syndrome (01/06/2007).  Sacral decubitus ulcer Ok for wound care orders  DNR (do not resuscitate) discussion Ok for out of hospital DNR form signed  Dementia (HCC) No behavioral issue, moving to memory care today,  to f/u any worsening symptoms or concerns  Acute on chronic renal failure (HCC) With mild worsening renal fxn last visit, to cont lasix for now, f/u BMP today  Followup: Return in about 4 months (around 12/30/2020).  Oliver Barre, MD 09/06/2020 4:29 AM [facility] Internal Medicine

## 2020-09-05 NOTE — Patient Instructions (Addendum)
Please continue all other medications as before, and refills have been done if requested.  Please have the pharmacy call with any other refills you may need.  Please continue your efforts at being more active, low cholesterol diet, and weight control.  You are otherwise up to date with prevention measures today.  Please keep your appointments with your specialists as you may have planned - wound care  Please go to the LAB at the blood drawing area for the tests to be done  You will be contacted by phone if any changes need to be made immediately.  Otherwise, you will receive a letter about your results with an explanation, but please check with MyChart first.  Please remember to sign up for MyChart if you have not done so, as this will be important to you in the future with finding out test results, communicating by private email, and scheduling acute appointments online when needed.  Please make an Appointment to return in Dec 30, 2020

## 2020-09-06 ENCOUNTER — Encounter: Payer: Self-pay | Admitting: Internal Medicine

## 2020-09-06 NOTE — Assessment & Plan Note (Signed)
Ok for wound care orders.

## 2020-09-06 NOTE — Assessment & Plan Note (Signed)
With mild worsening renal fxn last visit, to cont lasix for now, f/u BMP today

## 2020-09-06 NOTE — Assessment & Plan Note (Signed)
No behavioral issue, moving to memory care today,  to f/u any worsening symptoms or concerns

## 2020-09-06 NOTE — Assessment & Plan Note (Signed)
Ok for out of hospital DNR form signed

## 2020-09-17 NOTE — Progress Notes (Signed)
Remote pacemaker transmission.   

## 2020-09-18 DIAGNOSIS — R32 Unspecified urinary incontinence: Secondary | ICD-10-CM | POA: Diagnosis not present

## 2020-09-18 DIAGNOSIS — H6123 Impacted cerumen, bilateral: Secondary | ICD-10-CM | POA: Diagnosis not present

## 2020-09-18 DIAGNOSIS — L89152 Pressure ulcer of sacral region, stage 2: Secondary | ICD-10-CM | POA: Diagnosis not present

## 2020-09-18 DIAGNOSIS — I051 Rheumatic mitral insufficiency: Secondary | ICD-10-CM | POA: Diagnosis not present

## 2020-09-18 DIAGNOSIS — I7300 Raynaud's syndrome without gangrene: Secondary | ICD-10-CM | POA: Diagnosis not present

## 2020-09-18 DIAGNOSIS — N390 Urinary tract infection, site not specified: Secondary | ICD-10-CM | POA: Diagnosis not present

## 2020-09-18 DIAGNOSIS — H918X9 Other specified hearing loss, unspecified ear: Secondary | ICD-10-CM | POA: Diagnosis not present

## 2020-09-18 DIAGNOSIS — M4802 Spinal stenosis, cervical region: Secondary | ICD-10-CM | POA: Diagnosis not present

## 2020-09-18 DIAGNOSIS — M069 Rheumatoid arthritis, unspecified: Secondary | ICD-10-CM | POA: Diagnosis not present

## 2020-09-18 DIAGNOSIS — M810 Age-related osteoporosis without current pathological fracture: Secondary | ICD-10-CM | POA: Diagnosis not present

## 2020-09-18 DIAGNOSIS — E785 Hyperlipidemia, unspecified: Secondary | ICD-10-CM | POA: Diagnosis not present

## 2020-09-18 DIAGNOSIS — I10 Essential (primary) hypertension: Secondary | ICD-10-CM | POA: Diagnosis not present

## 2020-09-18 DIAGNOSIS — M1990 Unspecified osteoarthritis, unspecified site: Secondary | ICD-10-CM | POA: Diagnosis not present

## 2020-09-18 DIAGNOSIS — Z950 Presence of cardiac pacemaker: Secondary | ICD-10-CM | POA: Diagnosis not present

## 2020-09-19 ENCOUNTER — Emergency Department (HOSPITAL_BASED_OUTPATIENT_CLINIC_OR_DEPARTMENT_OTHER)
Admission: EM | Admit: 2020-09-19 | Discharge: 2020-09-20 | Disposition: A | Discharge status: Home / Self Care | Payer: Medicare Other | Attending: Emergency Medicine | Admitting: Emergency Medicine

## 2020-09-19 ENCOUNTER — Other Ambulatory Visit: Payer: Self-pay

## 2020-09-19 ENCOUNTER — Emergency Department (HOSPITAL_BASED_OUTPATIENT_CLINIC_OR_DEPARTMENT_OTHER): Payer: Medicare Other

## 2020-09-19 DIAGNOSIS — I10 Essential (primary) hypertension: Secondary | ICD-10-CM | POA: Insufficient documentation

## 2020-09-19 DIAGNOSIS — Z7982 Long term (current) use of aspirin: Secondary | ICD-10-CM | POA: Insufficient documentation

## 2020-09-19 DIAGNOSIS — L03116 Cellulitis of left lower limb: Secondary | ICD-10-CM | POA: Diagnosis not present

## 2020-09-19 DIAGNOSIS — M79669 Pain in unspecified lower leg: Secondary | ICD-10-CM | POA: Diagnosis present

## 2020-09-19 DIAGNOSIS — Z79899 Other long term (current) drug therapy: Secondary | ICD-10-CM | POA: Diagnosis not present

## 2020-09-19 DIAGNOSIS — Z950 Presence of cardiac pacemaker: Secondary | ICD-10-CM | POA: Diagnosis not present

## 2020-09-19 DIAGNOSIS — R600 Localized edema: Secondary | ICD-10-CM | POA: Diagnosis not present

## 2020-09-19 LAB — COMPREHENSIVE METABOLIC PANEL
ALT: 13 U/L (ref 0–44)
AST: 26 U/L (ref 15–41)
Albumin: 4 g/dL (ref 3.5–5.0)
Alkaline Phosphatase: 64 U/L (ref 38–126)
Anion gap: 9 (ref 5–15)
BUN: 60 mg/dL — ABNORMAL HIGH (ref 8–23)
CO2: 32 mmol/L (ref 22–32)
Calcium: 10.8 mg/dL — ABNORMAL HIGH (ref 8.9–10.3)
Chloride: 102 mmol/L (ref 98–111)
Creatinine, Ser: 1.4 mg/dL — ABNORMAL HIGH (ref 0.44–1.00)
GFR, Estimated: 35 mL/min — ABNORMAL LOW (ref >60)
Glucose, Bld: 94 mg/dL (ref 70–99)
Potassium: 3.7 mmol/L (ref 3.5–5.1)
Sodium: 143 mmol/L (ref 135–145)
Total Bilirubin: 0.3 mg/dL (ref 0.3–1.2)
Total Protein: 7.4 g/dL (ref 6.5–8.1)

## 2020-09-19 LAB — CBC WITH DIFFERENTIAL/PLATELET
Abs Immature Granulocytes: 0.05 10*3/uL (ref 0.00–0.07)
Basophils Absolute: 0.1 10*3/uL (ref 0.0–0.1)
Basophils Relative: 1 %
Eosinophils Absolute: 0 10*3/uL (ref 0.0–0.5)
Eosinophils Relative: 0 %
HCT: 38.8 % (ref 36.0–46.0)
Hemoglobin: 12.1 g/dL (ref 12.0–15.0)
Immature Granulocytes: 1 %
Lymphocytes Relative: 20 %
Lymphs Abs: 1.9 10*3/uL (ref 0.7–4.0)
MCH: 32.7 pg (ref 26.0–34.0)
MCHC: 31.2 g/dL (ref 30.0–36.0)
MCV: 104.9 fL — ABNORMAL HIGH (ref 80.0–100.0)
Monocytes Absolute: 0.9 10*3/uL (ref 0.1–1.0)
Monocytes Relative: 9 %
Neutro Abs: 6.3 10*3/uL (ref 1.7–7.7)
Neutrophils Relative %: 69 %
Platelets: 266 10*3/uL (ref 150–400)
RBC: 3.7 MIL/uL — ABNORMAL LOW (ref 3.87–5.11)
RDW: 15.2 % (ref 11.5–15.5)
WBC: 9.2 10*3/uL (ref 4.0–10.5)
nRBC: 0 % (ref 0.0–0.2)

## 2020-09-19 MED ORDER — CLINDAMYCIN PHOSPHATE 600 MG/50ML IV SOLN
600.0000 mg | Freq: Once | INTRAVENOUS | Status: AC
  Administered 2020-09-19: 600 mg via INTRAVENOUS
  Filled 2020-09-19: qty 50

## 2020-09-19 NOTE — ED Notes (Signed)
RT obtained PIV on pt in her right forearm, flushed, saline locked, secured it. 1 attempt.

## 2020-09-19 NOTE — ED Triage Notes (Signed)
Pt is present to the ED with her family from [facility] for left leg swelling since yesterday. Pt states it does not hurt but that it is "burning".

## 2020-09-20 MED ORDER — CLINDAMYCIN HCL 300 MG PO CAPS
300.0000 mg | ORAL_CAPSULE | Freq: Three times a day (TID) | ORAL | 0 refills | Status: DC
Start: 2020-09-20 — End: 2023-07-22

## 2020-09-20 NOTE — ED Provider Notes (Signed)
[facility] EMERGENCY DEPT Provider Note   CSN: [id number] Arrival date & time: 09/19/20  2153     History Chief Complaint  Patient presents with  . Leg Swelling  Level 5 caveat due to dementia  Alexis Cobb is a 87 y.o. female.  The history is provided by the patient and a relative.  Leg Pain Location:  Leg Leg location:  L lower leg Pain details:    Quality:  Aching and burning   Radiates to:  Does not radiate   Severity:  Moderate   Onset quality:  Gradual   Timing:  Constant   Progression:  Worsening Chronicity:  New Prior injury to area:  No Relieved by:  Nothing Worsened by:  Activity Associated symptoms: no fever    Patient presents from nursing facility with nephew.  Is reported the patient has had left leg pain and swelling for the past 2-3 days.  No trauma.  No fevers reported.  No other complaints.  History is limited by patient due to dementia    Past Medical History:  Diagnosis Date  . Arthritis   . ARTHRITIS, RHEUMATOID, HX OF 01/06/2007  . Cervical stenosis of spine   . HTN (hypertension)   . Hyperlipidemia   . MITRAL REGURGITATION 01/06/2007  . OSTEOARTHRITIS 01/14/2007  . OSTEOPOROSIS 01/14/2007  . Other specified forms of hearing loss 08/01/2009  . PACEMAKER, PERMANENT 01/06/2007  . Raynaud's syndrome 01/06/2007    Patient Active Problem List   Diagnosis Date Noted  . Acute on chronic renal failure (HCC) 09/05/2020  . DNR (do not resuscitate) discussion 09/05/2020  . Dementia (HCC) 09/05/2020  . Sacral decubitus ulcer 09/05/2020  . Confusion 06/30/2020  . LLQ pain 06/30/2020  . UTI (urinary tract infection) 06/30/2020  . Impacted cerumen of both ears 06/30/2020  . Wound healing, delayed 03/21/2018  . Acute hearing loss, right 03/21/2018  . Gross hematuria 10/25/2017  . Dysuria 02/05/2017  . Excessive thirst 10/19/2016  . Hearing loss 10/19/2016  . Pelvic fracture (HCC) 10/19/2016  . Pleural effusion   . S/P  thoracentesis   . Acute blood loss anemia   . Aspiration pneumonia (HCC)   . Shortness of breath   . Sore throat   . Anemia   . Acute respiratory failure with hypoxia (HCC) 05/01/2016  . Diarrhea 05/01/2016  . Rash 05/31/2015  . Hives 12/10/2014  . Chronic venous insufficiency 12/10/2014  . Peripheral neuropathy 12/12/2013  . Right shoulder pain 07/31/2012  . Anxiety state 07/31/2012  . Sinoatrial node dysfunction (HCC) 11/23/2011  . Polyarthropathy 12/31/2010  . Benign head tremor 12/31/2010  . Cervical radicular pain 12/31/2010  . Weakness 12/31/2010  . Encounter for well adult exam with abnormal findings 11/27/2010  . OSTEOARTHRITIS 01/14/2007  . OSTEOPOROSIS 01/14/2007  . Hyperlipidemia 01/06/2007  . MITRAL REGURGITATION 01/06/2007  . Essential hypertension 01/06/2007  . Raynaud's syndrome 01/06/2007  . PACEMAKER, PERMANENT 01/06/2007    Past Surgical History:  Procedure Laterality Date  . ABDOMINAL HYSTERECTOMY    . APPENDECTOMY    . CATARACT EXTRACTION, BILATERAL    . MITRAL VALVE REPAIR    . ovarian tumor, benign    . PACEMAKER INSERTION    . PPM GENERATOR CHANGEOUT N/A 02/04/2017   Procedure: PPM GENERATOR CHANGEOUT;  Surgeon: Marinus Maw, MD;  Location: [facility] INVASIVE CV LAB;  Service: Cardiovascular;  Laterality: N/A;     OB History   No obstetric history on file.     Family History  Problem  Relation Age of Onset  . Coronary artery disease Other   . Hypertension Other     Social History   Tobacco Use  . Smoking status: Never Smoker  . Smokeless tobacco: Never Used  Vaping Use  . Vaping Use: Never used  Substance Use Topics  . Alcohol use: No    Alcohol/week: 0.0 standard drinks  . Drug use: No    Home Medications Prior to Admission medications   Medication Sig Start Date End Date Taking? Authorizing Provider  clindamycin (CLEOCIN) 300 MG capsule Take 1 capsule (300 mg total) by mouth 3 (three) times daily. X 7 days 09/20/20  Yes Zadie Rhine, MD  acetaminophen (TYLENOL) 500 MG tablet Take 1 tablet (500 mg total) by mouth every 6 (six) hours as needed. 04/10/19   Corwin Levins, MD  Amino Acids-Protein Hydrolys (FEEDING SUPPLEMENT, PRO-STAT SUGAR FREE 64,) LIQD Take 30 mLs by mouth daily.    [provider]  aspirin EC 81 MG tablet Take 81 mg by mouth at bedtime.    [provider]  ASPIRIN LOW DOSE 81 MG chewable tablet GIVE 1 TAB BY MOUTH AT BEDTIME FOR CIRCULATION 11/26/19   Corwin Levins, MD  Calcium Carbonate-Vitamin D 600-400 MG-UNIT tablet Take 1 tablet by mouth 2 (two) times daily.    [provider]  cholecalciferol (VITAMIN D) 1000 units tablet Take 1,000 Units by mouth daily.    [provider]  feeding supplement (BOOST / RESOURCE BREEZE) LIQD Take 237 mLs by mouth 2 (two) times daily.    [provider]  furosemide (LASIX) 20 MG tablet Take 20 mg by mouth daily. 10/13/19   [provider]  losartan (COZAAR) 100 MG tablet Take 1 tablet (100 mg total) by mouth daily. Take 100 mg by mouth. 04/10/19   Corwin Levins, MD  lovastatin (MEVACOR) 40 MG tablet Take 40 mg by mouth daily. 09/27/19   [provider]  metoprolol succinate (TOPROL-XL) 25 MG 24 hr tablet Take 2 tablets (50 mg total) by mouth daily. Take with or immediately following a meal. 04/10/19   Corwin Levins, MD  Multiple Vitamin (MULTIVITAMIN WITH MINERALS) TABS tablet Take 1 tablet by mouth daily.    [provider]  nitroGLYCERIN (NITROSTAT) 0.4 MG SL tablet TAKE 1 TAB SUBLINGUALLY EVERY 5 MINUTES FOR 3 DOSES AS NEEDED FOR CHEST PAIN. IF NO RELIEF CALL MD. * NOT TO EXCEED 3TABS/24HRS* 04/09/19   Corwin Levins, MD    Allergies    Ace inhibitors, Cephalexin, Penicillins, and Risedronate sodium  Review of Systems   Review of Systems  Unable to perform ROS: Dementia  Constitutional: Negative for fever.    Physical Exam Updated Vital Signs BP 123/63   Pulse (!) 58   Temp 98.2 F (36.8 C)  (Oral)   Resp 18   Ht 1.651 m (5\' 5" )   Wt 44.7 kg   SpO2 100%   BMI 16.40 kg/m   Physical Exam CONSTITUTIONAL: Elderly, no acute distress HEAD: Normocephalic/atraumatic EYES: EOMI ENMT: Mucous membranes moist NECK: supple no meningeal signs SPINE/BACK:entire spine nontender CV: S1/S2 noted LUNGS: Lungs are clear to auscultation bilaterally, no apparent distress ABDOMEN: soft, nontender NEURO: Pt is awake/alert/appropriate, moves all extremitiesx4.  No facial droop.   EXTREMITIES: pulses normal/equal, full ROM, pulses equal and intact.  Mild tenderness noted to left lower extremity.  No crepitus.  No drainage.  She is able to fully flex and extend the left ankle.  See  photo below SKIN: warm, color normal, see photo PSYCH: no abnormalities of mood noted, alert and oriented to situation      ED Results / Procedures / Treatments   Labs (all labs ordered are listed, but only abnormal results are displayed) Labs Reviewed  CBC WITH DIFFERENTIAL/PLATELET - Abnormal; Notable for the following components:      Result Value   RBC 3.70 (*)    MCV 104.9 (*)    All other components within normal limits  COMPREHENSIVE METABOLIC PANEL - Abnormal; Notable for the following components:   BUN 60 (*)    Creatinine, Ser 1.40 (*)    Calcium 10.8 (*)    GFR, Estimated 35 (*)    All other components within normal limits    EKG None  Radiology US Venous Img Lower Unilateral Left  Result Date: 09/19/2020 CLINICAL DATA:  87 year old with leg swelling, left calf redness and burning. EXAM: LEFT LOWER EXTREMITY VENOUS DOPPLER ULTRASOUND TECHNIQUE: Gray-scale sonography with compression, as well as color and duplex ultrasound, were performed to evaluate the deep venous system(s) from the level of the common femoral vein through the popliteal and proximal calf veins. COMPARISON:  None. FINDINGS: VENOUS Normal compressibility of the common femoral, superficial femoral, and popliteal veins, as well  as the visualized calf veins. Visualized portions of profunda femoral vein and great saphenous vein unremarkable. No filling defects to suggest DVT on grayscale or color Doppler imaging. Doppler waveforms show normal direction of venous flow, normal respiratory plasticity and response to augmentation. Limited views of the contralateral common femoral vein are unremarkable. OTHER Calf soft tissue edema is noted. Limitations: none IMPRESSION: 1. No evidence of left lower extremity DVT. 2. Soft tissue edema in the calf. Electronically Signed   By: Narda Rutherford M.D.   On: 09/19/2020 23:14    Procedures Procedures   Medications Ordered in ED Medications  clindamycin (CLEOCIN) IVPB 600 mg (600 mg Intravenous New Bag/Given 09/19/20 2357)    ED Course  I have reviewed the triage vital signs and the nursing notes.  Pertinent labs & imaging results that were available during my care of the patient were reviewed by me and considered in my medical decision making (see chart for details).    MDM Rules/Calculators/A&P                          Patient presents with left leg pain and swelling.  DVT study is negative.  Will treat for cellulitis.  Patient has multiple antibiotic allergies, therefore will use clindamycin  Final Clinical Impression(s) / ED Diagnoses Final diagnoses:  Cellulitis of left lower extremity    Rx / DC Orders ED Discharge Orders         Ordered    clindamycin (CLEOCIN) 300 MG capsule  3 times daily        09/20/20 0012           Zadie Rhine, MD 09/20/20 0015

## 2020-09-26 ENCOUNTER — Emergency Department (HOSPITAL_COMMUNITY): Payer: Medicare Other

## 2020-09-26 ENCOUNTER — Emergency Department (HOSPITAL_COMMUNITY)
Admission: EM | Admit: 2020-09-26 | Discharge: 2020-09-26 | Disposition: A | Discharge status: Home / Self Care | Payer: Medicare Other | Attending: Emergency Medicine | Admitting: Emergency Medicine

## 2020-09-26 DIAGNOSIS — Z79899 Other long term (current) drug therapy: Secondary | ICD-10-CM | POA: Diagnosis not present

## 2020-09-26 DIAGNOSIS — S0990XA Unspecified injury of head, initial encounter: Secondary | ICD-10-CM | POA: Diagnosis present

## 2020-09-26 DIAGNOSIS — I10 Essential (primary) hypertension: Secondary | ICD-10-CM | POA: Diagnosis not present

## 2020-09-26 DIAGNOSIS — Y92129 Unspecified place in nursing home as the place of occurrence of the external cause: Secondary | ICD-10-CM | POA: Diagnosis not present

## 2020-09-26 DIAGNOSIS — Z23 Encounter for immunization: Secondary | ICD-10-CM | POA: Diagnosis not present

## 2020-09-26 DIAGNOSIS — F0390 Unspecified dementia without behavioral disturbance: Secondary | ICD-10-CM | POA: Diagnosis not present

## 2020-09-26 DIAGNOSIS — Z043 Encounter for examination and observation following other accident: Secondary | ICD-10-CM | POA: Diagnosis not present

## 2020-09-26 DIAGNOSIS — Z951 Presence of aortocoronary bypass graft: Secondary | ICD-10-CM | POA: Diagnosis not present

## 2020-09-26 DIAGNOSIS — W19XXXA Unspecified fall, initial encounter: Secondary | ICD-10-CM | POA: Diagnosis not present

## 2020-09-26 DIAGNOSIS — M25512 Pain in left shoulder: Secondary | ICD-10-CM | POA: Diagnosis not present

## 2020-09-26 DIAGNOSIS — S70212A Abrasion, left hip, initial encounter: Secondary | ICD-10-CM | POA: Insufficient documentation

## 2020-09-26 DIAGNOSIS — S199XXA Unspecified injury of neck, initial encounter: Secondary | ICD-10-CM | POA: Diagnosis not present

## 2020-09-26 DIAGNOSIS — S0101XA Laceration without foreign body of scalp, initial encounter: Secondary | ICD-10-CM | POA: Insufficient documentation

## 2020-09-26 DIAGNOSIS — I700 Atherosclerosis of aorta: Secondary | ICD-10-CM | POA: Diagnosis not present

## 2020-09-26 DIAGNOSIS — M47812 Spondylosis without myelopathy or radiculopathy, cervical region: Secondary | ICD-10-CM | POA: Diagnosis not present

## 2020-09-26 DIAGNOSIS — Z7982 Long term (current) use of aspirin: Secondary | ICD-10-CM | POA: Insufficient documentation

## 2020-09-26 DIAGNOSIS — M25519 Pain in unspecified shoulder: Secondary | ICD-10-CM | POA: Diagnosis not present

## 2020-09-26 LAB — URINALYSIS, MICROSCOPIC (REFLEX)

## 2020-09-26 LAB — CBC WITH DIFFERENTIAL/PLATELET
Abs Immature Granulocytes: 0.15 10*3/uL — ABNORMAL HIGH (ref 0.00–0.07)
Basophils Absolute: 0.1 10*3/uL (ref 0.0–0.1)
Basophils Relative: 0 %
Eosinophils Absolute: 0 10*3/uL (ref 0.0–0.5)
Eosinophils Relative: 0 %
HCT: 36.6 % (ref 36.0–46.0)
Hemoglobin: 11.4 g/dL — ABNORMAL LOW (ref 12.0–15.0)
Immature Granulocytes: 1 %
Lymphocytes Relative: 10 %
Lymphs Abs: 1.5 10*3/uL (ref 0.7–4.0)
MCH: 33.3 pg (ref 26.0–34.0)
MCHC: 31.1 g/dL (ref 30.0–36.0)
MCV: 107 fL — ABNORMAL HIGH (ref 80.0–100.0)
Monocytes Absolute: 1.2 10*3/uL — ABNORMAL HIGH (ref 0.1–1.0)
Monocytes Relative: 8 %
Neutro Abs: 12 10*3/uL — ABNORMAL HIGH (ref 1.7–7.7)
Neutrophils Relative %: 81 %
Platelets: 248 10*3/uL (ref 150–400)
RBC: 3.42 MIL/uL — ABNORMAL LOW (ref 3.87–5.11)
RDW: 14.8 % (ref 11.5–15.5)
WBC: 14.8 10*3/uL — ABNORMAL HIGH (ref 4.0–10.5)
nRBC: 0 % (ref 0.0–0.2)

## 2020-09-26 LAB — URINALYSIS, ROUTINE W REFLEX MICROSCOPIC
Bilirubin Urine: NEGATIVE
Glucose, UA: NEGATIVE mg/dL
Ketones, ur: NEGATIVE mg/dL
Nitrite: NEGATIVE
Protein, ur: 30 mg/dL — AB
Specific Gravity, Urine: 1.01 (ref 1.005–1.030)
pH: 7.5 (ref 5.0–8.0)

## 2020-09-26 LAB — BASIC METABOLIC PANEL
Anion gap: 8 (ref 5–15)
BUN: 70 mg/dL — ABNORMAL HIGH (ref 8–23)
CO2: 29 mmol/L (ref 22–32)
Calcium: 10.1 mg/dL (ref 8.9–10.3)
Chloride: 100 mmol/L (ref 98–111)
Creatinine, Ser: 1.5 mg/dL — ABNORMAL HIGH (ref 0.44–1.00)
GFR, Estimated: 32 mL/min — ABNORMAL LOW (ref >60)
Glucose, Bld: 118 mg/dL — ABNORMAL HIGH (ref 70–99)
Potassium: 4.7 mmol/L (ref 3.5–5.1)
Sodium: 137 mmol/L (ref 135–145)

## 2020-09-26 MED ORDER — TETANUS-DIPHTH-ACELL PERTUSSIS 5-2.5-18.5 LF-MCG/0.5 IM SUSY
0.5000 mL | PREFILLED_SYRINGE | Freq: Once | INTRAMUSCULAR | Status: AC
  Administered 2020-09-26: 0.5 mL via INTRAMUSCULAR
  Filled 2020-09-26: qty 0.5

## 2020-09-26 MED ORDER — SODIUM CHLORIDE 0.9 % IV BOLUS
1000.0000 mL | Freq: Once | INTRAVENOUS | Status: AC
  Administered 2020-09-26: 1000 mL via INTRAVENOUS

## 2020-09-26 NOTE — ED Provider Notes (Signed)
[facility] EMERGENCY DEPARTMENT Provider Note   CSN: [id number] Arrival date & time: 09/26/20  0032     History Chief Complaint  Patient presents with  . Fall    Alexis Cobb is a 87 y.o. female.  Unwitnessed fall at nursing home.  Maybe last seen about 2 hours prior to fall.  Laceration to the scalp.  Not on blood thinners.  History of dementia.  At her baseline.  Complaining of left arm pain, abrasion to left hip.  The history is provided by the patient.  Fall This is a new problem. The problem occurs constantly. Pertinent negatives include no chest pain, no abdominal pain and no shortness of breath. Nothing aggravates the symptoms. Nothing relieves the symptoms. She has tried nothing for the symptoms. The treatment provided no relief.       Past Medical History:  Diagnosis Date  . Arthritis   . ARTHRITIS, RHEUMATOID, HX OF 01/06/2007  . Cervical stenosis of spine   . HTN (hypertension)   . Hyperlipidemia   . MITRAL REGURGITATION 01/06/2007  . OSTEOARTHRITIS 01/14/2007  . OSTEOPOROSIS 01/14/2007  . Other specified forms of hearing loss 08/01/2009  . PACEMAKER, PERMANENT 01/06/2007  . Raynaud's syndrome 01/06/2007    Patient Active Problem List   Diagnosis Date Noted  . Acute on chronic renal failure (HCC) 09/05/2020  . DNR (do not resuscitate) discussion 09/05/2020  . Dementia (HCC) 09/05/2020  . Sacral decubitus ulcer 09/05/2020  . Confusion 06/30/2020  . LLQ pain 06/30/2020  . UTI (urinary tract infection) 06/30/2020  . Impacted cerumen of both ears 06/30/2020  . Wound healing, delayed 03/21/2018  . Acute hearing loss, right 03/21/2018  . Gross hematuria 10/25/2017  . Dysuria 02/05/2017  . Excessive thirst 10/19/2016  . Hearing loss 10/19/2016  . Pelvic fracture (HCC) 10/19/2016  . Pleural effusion   . S/P thoracentesis   . Acute blood loss anemia   . Aspiration pneumonia (HCC)   . Shortness of breath   . Sore throat   . Anemia   .  Acute respiratory failure with hypoxia (HCC) 05/01/2016  . Diarrhea 05/01/2016  . Rash 05/31/2015  . Hives 12/10/2014  . Chronic venous insufficiency 12/10/2014  . Peripheral neuropathy 12/12/2013  . Right shoulder pain 07/31/2012  . Anxiety state 07/31/2012  . Sinoatrial node dysfunction (HCC) 11/23/2011  . Polyarthropathy 12/31/2010  . Benign head tremor 12/31/2010  . Cervical radicular pain 12/31/2010  . Weakness 12/31/2010  . Encounter for well adult exam with abnormal findings 11/27/2010  . OSTEOARTHRITIS 01/14/2007  . OSTEOPOROSIS 01/14/2007  . Hyperlipidemia 01/06/2007  . MITRAL REGURGITATION 01/06/2007  . Essential hypertension 01/06/2007  . Raynaud's syndrome 01/06/2007  . PACEMAKER, PERMANENT 01/06/2007    Past Surgical History:  Procedure Laterality Date  . ABDOMINAL HYSTERECTOMY    . APPENDECTOMY    . CATARACT EXTRACTION, BILATERAL    . MITRAL VALVE REPAIR    . ovarian tumor, benign    . PACEMAKER INSERTION    . PPM GENERATOR CHANGEOUT N/A 02/04/2017   Procedure: PPM GENERATOR CHANGEOUT;  Surgeon: Marinus Maw, MD;  Location: [facility] INVASIVE CV LAB;  Service: Cardiovascular;  Laterality: N/A;     OB History   No obstetric history on file.     Family History  Problem Relation Age of Onset  . Coronary artery disease Other   . Hypertension Other     Social History   Tobacco Use  . Smoking status: Never Smoker  . Smokeless  tobacco: Never Used  Vaping Use  . Vaping Use: Never used  Substance Use Topics  . Alcohol use: No    Alcohol/week: 0.0 standard drinks  . Drug use: No    Home Medications Prior to Admission medications   Medication Sig Start Date End Date Taking? Authorizing Provider  acetaminophen (TYLENOL) 500 MG tablet Take 1 tablet (500 mg total) by mouth every 6 (six) hours as needed. 04/10/19   Corwin Levins, MD  Amino Acids-Protein Hydrolys (FEEDING SUPPLEMENT, PRO-STAT SUGAR FREE 64,) LIQD Take 30 mLs by mouth daily.    [provider]  aspirin EC 81 MG tablet Take 81 mg by mouth at bedtime.    [provider]  ASPIRIN LOW DOSE 81 MG chewable tablet GIVE 1 TAB BY MOUTH AT BEDTIME FOR CIRCULATION 11/26/19   Corwin Levins, MD  Calcium Carbonate-Vitamin D 600-400 MG-UNIT tablet Take 1 tablet by mouth 2 (two) times daily.    [provider]  cholecalciferol (VITAMIN D) 1000 units tablet Take 1,000 Units by mouth daily.    [provider]  clindamycin (CLEOCIN) 300 MG capsule Take 1 capsule (300 mg total) by mouth 3 (three) times daily. X 7 days 09/20/20   Zadie Rhine, MD  feeding supplement (BOOST / RESOURCE BREEZE) LIQD Take 237 mLs by mouth 2 (two) times daily.    [provider]  furosemide (LASIX) 20 MG tablet Take 20 mg by mouth daily. 10/13/19   [provider]  losartan (COZAAR) 100 MG tablet Take 1 tablet (100 mg total) by mouth daily. Take 100 mg by mouth. 04/10/19   Corwin Levins, MD  lovastatin (MEVACOR) 40 MG tablet Take 40 mg by mouth daily. 09/27/19   [provider]  metoprolol succinate (TOPROL-XL) 25 MG 24 hr tablet Take 2 tablets (50 mg total) by mouth daily. Take with or immediately following a meal. 04/10/19   Corwin Levins, MD  Multiple Vitamin (MULTIVITAMIN WITH MINERALS) TABS tablet Take 1 tablet by mouth daily.    [provider]  nitroGLYCERIN (NITROSTAT) 0.4 MG SL tablet TAKE 1 TAB SUBLINGUALLY EVERY 5 MINUTES FOR 3 DOSES AS NEEDED FOR CHEST PAIN. IF NO RELIEF CALL MD. * NOT TO EXCEED 3TABS/24HRS* 04/09/19   Corwin Levins, MD    Allergies    Ace inhibitors, Cephalexin, Penicillins, and Risedronate sodium  Review of Systems   Review of Systems  Constitutional: Negative for chills and fever.  HENT: Negative for ear pain and sore throat.   Eyes: Negative for pain and visual disturbance.  Respiratory: Negative for cough and shortness of breath.   Cardiovascular: Negative for chest pain and palpitations.  Gastrointestinal: Negative  for abdominal pain and vomiting.  Genitourinary: Negative for dysuria and hematuria.  Musculoskeletal: Positive for arthralgias. Negative for back pain.  Skin: Positive for wound. Negative for color change and rash.  Neurological: Negative for seizures and syncope.  All other systems reviewed and are negative.   Physical Exam Updated Vital Signs  ED Triage Vitals  Enc Vitals Group     BP 09/26/20 0109 (!) 144/65     Pulse Rate 09/26/20 0108 68     Resp 09/26/20 0108 17     Temp 09/26/20 0109 (!) 94.8 F (34.9 C)     Temp Source 09/26/20 0109 Rectal     SpO2 09/26/20 0108 100 %     Weight --      Height --      Head Circumference --  Peak Flow --      Pain Score --      Pain Loc --      Pain Edu? --      Excl. in GC? --     Physical Exam Vitals and nursing note reviewed.  Constitutional:      General: She is not in acute distress.    Appearance: She is well-developed. She is not ill-appearing.  HENT:     Nose: Nose normal.     Mouth/Throat:     Mouth: Mucous membranes are moist.  Eyes:     Extraocular Movements: Extraocular movements intact.     Conjunctiva/sclera: Conjunctivae normal.     Pupils: Pupils are equal, round, and reactive to light.  Neck:     Comments: In cervical collar Cardiovascular:     Rate and Rhythm: Normal rate and regular rhythm.     Heart sounds: No murmur heard.   Pulmonary:     Effort: Pulmonary effort is normal. No respiratory distress.     Breath sounds: Normal breath sounds.  Abdominal:     Palpations: Abdomen is soft.     Tenderness: There is no abdominal tenderness.  Musculoskeletal:        General: Tenderness present.     Cervical back: Neck supple.     Comments: Tenderness to left shoulder, left hip, no obvious midline spinal tenderness  Skin:    General: Skin is warm and dry.     Comments: Laceration to scalp hemostatic, abrasion to left hip  Neurological:     General: No focal deficit present.     Mental Status: She  is alert.     Motor: No weakness.     Comments: Answers questions well, moves all extremities     ED Results / Procedures / Treatments   Labs (all labs ordered are listed, but only abnormal results are displayed) Labs Reviewed  CBC WITH DIFFERENTIAL/PLATELET - Abnormal; Notable for the following components:      Result Value   WBC 14.8 (*)    RBC 3.42 (*)    Hemoglobin 11.4 (*)    MCV 107.0 (*)    Neutro Abs 12.0 (*)    Monocytes Absolute 1.2 (*)    Abs Immature Granulocytes 0.15 (*)    All other components within normal limits  BASIC METABOLIC PANEL - Abnormal; Notable for the following components:   Glucose, Bld 118 (*)    BUN 70 (*)    Creatinine, Ser 1.50 (*)    GFR, Estimated 32 (*)    All other components within normal limits  URINALYSIS, ROUTINE W REFLEX MICROSCOPIC - Abnormal; Notable for the following components:   Hgb urine dipstick LARGE (*)    Protein, ur 30 (*)    Leukocytes,Ua SMALL (*)    All other components within normal limits  URINALYSIS, MICROSCOPIC (REFLEX) - Abnormal; Notable for the following components:   Bacteria, UA RARE (*)    All other components within normal limits  URINE CULTURE    EKG EKG Interpretation  Date/Time:  Friday Sep 26 2020 00:48:47 EDT Ventricular Rate:  72 PR Interval:    QRS Duration: 147 QT Interval:  493 QTC Calculation: 540 R Axis:   -67 Text Interpretation: Accelerated junctional rhythm IVCD, consider atypical RBBB LVH with secondary repolarization abnormality Inferior infarct, old Anterior infarct, old Confirmed by Virgina Norfolk [phone]) on 09/26/2020 1:04:27 AM   Radiology DG Chest 2 View  Result Date: 09/26/2020 CLINICAL DATA:  fall EXAM:  CHEST - 2 VIEW COMPARISON:  March 28, 2019. FINDINGS: Prior median sternotomy. Left chest dual lead pacemaker with leads overlying the right atrium right ventricle. The heart size and mediastinal contours are within normal limits. Aortic atherosclerosis. No focal consolidation.  No visible pneumothorax. No acute osseous abnormality visualized. IMPRESSION: 1. No acute cardiopulmonary findings. 2. Aortic atherosclerosis.  Aortic Atherosclerosis (ICD10-I70.0). Electronically Signed   By: Maudry Mayhew MD   On: 09/26/2020 02:00   CT Head Wo Contrast  Result Date: 09/26/2020 CLINICAL DATA:  fall EXAM: CT HEAD WITHOUT CONTRAST CT CERVICAL SPINE WITHOUT CONTRAST TECHNIQUE: Multidetector CT imaging of the head and cervical spine was performed following the standard protocol without intravenous contrast. Multiplanar CT image reconstructions of the cervical spine were also generated. COMPARISON:  April 30, 2016. FINDINGS: CT HEAD FINDINGS Brain: No evidence of acute large vascular territory infarction, hemorrhage, hydrocephalus, extra-axial collection or mass lesion/mass effect. Similar age related global parenchymal volume loss with ex vacuo dilatation of ventricular system. Stable burden of chronic microvascular ischemic white matter disease. Vascular: No hyperdense vessel. Atherosclerotic calcifications of the internal carotid arteries and vertebral arteries at the skull base. Skull: Extra calvarial hematoma closed with skin staples overlying the frontal vertex. No acute fracture visualized. Sinuses/Orbits: Visualized paranasal sinuses and mastoid air cells are predominantly clear. Other: None CT CERVICAL SPINE FINDINGS Alignment: Preservation of the cervical lordosis. No evidence of traumatic listhesis. Skull base and vertebrae: No acute fracture. Soft tissues and spinal canal: No prevertebral fluid or swelling. No visible canal hematoma. Disc levels: Multilevel degenerative changes spine with disc space narrowing osteophytosis and facet hypertrophy. Upper chest: Biapical pleuroparenchymal scarring. Other: Carotid artery calcifications. IMPRESSION: 1. Extra calvarial hematoma closed with skin staples overlying the frontal vertex. No acute fracture visualized. 2. No evidence of acute  intracranial abnormality. 3. Stable age related global parenchymal volume loss and chronic microvascular ischemic white matter disease. 4. No evidence of acute fracture or traumatic listhesis of the cervical spine. 5. Multilevel degenerative changes of the cervical spine. Electronically Signed   By: Maudry Mayhew MD   On: 09/26/2020 01:49   CT Cervical Spine Wo Contrast  Result Date: 09/26/2020 CLINICAL DATA:  fall EXAM: CT HEAD WITHOUT CONTRAST CT CERVICAL SPINE WITHOUT CONTRAST TECHNIQUE: Multidetector CT imaging of the head and cervical spine was performed following the standard protocol without intravenous contrast. Multiplanar CT image reconstructions of the cervical spine were also generated. COMPARISON:  April 30, 2016. FINDINGS: CT HEAD FINDINGS Brain: No evidence of acute large vascular territory infarction, hemorrhage, hydrocephalus, extra-axial collection or mass lesion/mass effect. Similar age related global parenchymal volume loss with ex vacuo dilatation of ventricular system. Stable burden of chronic microvascular ischemic white matter disease. Vascular: No hyperdense vessel. Atherosclerotic calcifications of the internal carotid arteries and vertebral arteries at the skull base. Skull: Extra calvarial hematoma closed with skin staples overlying the frontal vertex. No acute fracture visualized. Sinuses/Orbits: Visualized paranasal sinuses and mastoid air cells are predominantly clear. Other: None CT CERVICAL SPINE FINDINGS Alignment: Preservation of the cervical lordosis. No evidence of traumatic listhesis. Skull base and vertebrae: No acute fracture. Soft tissues and spinal canal: No prevertebral fluid or swelling. No visible canal hematoma. Disc levels: Multilevel degenerative changes spine with disc space narrowing osteophytosis and facet hypertrophy. Upper chest: Biapical pleuroparenchymal scarring. Other: Carotid artery calcifications. IMPRESSION: 1. Extra calvarial hematoma closed with  skin staples overlying the frontal vertex. No acute fracture visualized. 2. No evidence of acute intracranial abnormality. 3. Stable age related  global parenchymal volume loss and chronic microvascular ischemic white matter disease. 4. No evidence of acute fracture or traumatic listhesis of the cervical spine. 5. Multilevel degenerative changes of the cervical spine. Electronically Signed   By: Maudry Mayhew MD   On: 09/26/2020 01:49   DG Shoulder Left  Result Date: 09/26/2020 CLINICAL DATA:  fall the EXAM: LEFT SHOULDER - 2+ VIEW COMPARISON:  None. FINDINGS: Diffuse demineralization of bone. There is no evidence of fracture or dislocation. There is no evidence of arthropathy or other focal bone abnormality. Left chest pacemaker. Median sternotomy wires. IMPRESSION: 1. No acute fracture or dislocation of the left shoulder. Electronically Signed   By: Maudry Mayhew MD   On: 09/26/2020 02:05   DG Hip Unilat With Pelvis 2-3 Views Left  Result Date: 09/26/2020 CLINICAL DATA:  fall EXAM: DG HIP (WITH OR WITHOUT PELVIS) 2-3V LEFT COMPARISON:  March 28, 2019. FINDINGS: Diffuse demineralization of bone. Stable remote posttraumatic deformity of both inferior pubic rami, the right pubic root and left pubic body. There is no evidence of acute hip fracture or dislocation. Surgical clips overlie the right hemipelvis. Pelvic phleboliths. Lumbar spondylosis. Right renal stone. IMPRESSION: 1. No acute fracture or dislocation of the left hip. 2. Stable remote posttraumatic deformity of the pelvis. Electronically Signed   By: Maudry Mayhew MD   On: 09/26/2020 02:03    Procedures .Marland KitchenLaceration Repair  Date/Time: 09/26/2020 1:18 AM Performed by: Virgina Norfolk, DO Authorized by: Virgina Norfolk, DO   Consent:    Consent obtained:  Emergent situation   Risks, benefits, and alternatives were discussed: not applicable     Procedural risks discussed: Dementia history, laceration repair done under assumption that  patient would want repair  Universal protocol:    Procedure explained and questions answered to patient or proxy's satisfaction: yes     Relevant documents present and verified: yes     Patient identity confirmed:  Arm band Laceration details:    Location:  Scalp   Scalp location:  Frontal   Length (cm):  2   Depth (mm):  1 Exploration:    Limited defect created (wound extended): no     Wound exploration: wound explored through full range of motion and entire depth of wound visualized     Wound extent: no areolar tissue violation noted, no fascia violation noted, no foreign bodies/material noted, no muscle damage noted, no nerve damage noted, no tendon damage noted, no underlying fracture noted and no vascular damage noted     Contaminated: no   Treatment:    Area cleansed with:  Shur-Clens and saline   Amount of cleaning:  Standard   Irrigation volume:  500   Irrigation method:  Pressure wash   Visualized foreign bodies/material removed: no     Debridement:  None Skin repair:    Repair method:  Staples   Number of staples:  4 Approximation:    Approximation:  Close Repair type:    Repair type:  Simple Post-procedure details:    Dressing:  Open (no dressing)   Procedure completion:  Tolerated     Medications Ordered in ED Medications  Tdap (BOOSTRIX) injection 0.5 mL (0.5 mLs Intramuscular Given 09/26/20 0300)  sodium chloride 0.9 % bolus 1,000 mL (1,000 mLs Intravenous New Bag/Given 09/26/20 0334)    ED Course  I have reviewed the triage vital signs and the nursing notes.  Pertinent labs & imaging results that were available during my care of the patient were reviewed by  me and considered in my medical decision making (see chart for details).    MDM Rules/Calculators/A&P                          MARKEYA MINCY is a 87 year old female with history of dementia who presents the ED after fall.  Unwitnessed fall at her memory care unit.  Laceration to the scalp that  is hemostatic and was repaired with staples.  Patient is awake and alert.  Has some left shoulder pain but otherwise does not have any other pain.  Currently on antibiotics for left lower leg cellulitis that overall appears well.  Vital signs are normal except that she is mildly hypothermic at 94.8 degrees.  Was last seen about 2 hours prior to her fall.  She has abrasion to the left hip.  Head CT, neck CT, extremity x-rays were overall unremarkable.  No fractures.  No head bleed.  No significant anemia, electrolyte abnormality, kidney injury.  Patient to be placed on the bear hugger and given warm fluids.  Will check for UTI.  Overall will reevaluate.  Suspect that hypothermic because on the floor for several hours.  Lab work overall unremarkable.  No significant anemia, electrolyte abnormality, kidney injury.  Urinalysis negative for infection.  Temperature is now normal after short time on bear hugger.  Overall suspect mechanical fall.  Discharged in good condition.  Staples need to be removed in about a week.  This chart was dictated using voice recognition software.  Despite best efforts to proofread,  errors can occur which can change the documentation meaning.    Final Clinical Impression(s) / ED Diagnoses Final diagnoses:  Laceration of scalp, initial encounter    Rx / DC Orders ED Discharge Orders    None       Virgina Norfolk, DO 09/26/20 0503

## 2020-09-26 NOTE — ED Notes (Signed)
Obtained rectal tem due to both oral and axillary temps not reading. Eulis Foster, RN and [place], DO notified.

## 2020-09-26 NOTE — ED Notes (Signed)
[facility] called  

## 2020-09-26 NOTE — ED Notes (Signed)
Alexis Cobb, patient newphew, wants calls with updates [phone]

## 2020-09-26 NOTE — ED Notes (Signed)
Pt's temp 94.8 rectal. Pt placed on Bear Hugger warming blanket. [place], MD has been notified.

## 2020-09-26 NOTE — ED Triage Notes (Signed)
Pt arrives to ED from [facility] due to a fall. Per EMS pt had an unwitnessed fall and was found by staff while doing rounds. Unknown how long pt was down. Pt has a Lac to the top of head. Hx of Dementia. Pt wearing C-Collar. EDP at bedside upon pt's arrival.  BP 110/80 HR 80 O2 99% RA CBG 171

## 2020-09-26 NOTE — Discharge Instructions (Addendum)
Staples need to be removed in about 7 to 10 days.

## 2020-09-27 LAB — URINE CULTURE: Culture: NO GROWTH

## 2020-10-29 ENCOUNTER — Other Ambulatory Visit: Payer: Self-pay | Admitting: Internal Medicine

## 2020-11-15 DIAGNOSIS — L03116 Cellulitis of left lower limb: Secondary | ICD-10-CM | POA: Diagnosis not present

## 2020-11-17 ENCOUNTER — Telehealth: Payer: Self-pay | Admitting: Internal Medicine

## 2020-11-17 NOTE — Telephone Encounter (Signed)
Patient appt scheduled for tomorrow

## 2020-11-17 NOTE — Telephone Encounter (Signed)
Needs visit today, can be virtual if she is unable to get to office.

## 2020-11-17 NOTE — Telephone Encounter (Signed)
[facility] FYI 7.9.22:  ---Caller states her patient appears to have cellulitis in her left foot. Had LF cellulitis about 30mo ago. Foot is red, swelling. She is calling for an order for antibiotics. The pharmacy is [facility] or [place].  Advised to see PCP within 4 hours.

## 2020-11-18 ENCOUNTER — Ambulatory Visit: Payer: Medicare Other | Admitting: Internal Medicine

## 2020-11-18 DIAGNOSIS — E782 Mixed hyperlipidemia: Secondary | ICD-10-CM | POA: Diagnosis not present

## 2020-11-18 DIAGNOSIS — F0280 Dementia in other diseases classified elsewhere without behavioral disturbance: Secondary | ICD-10-CM | POA: Diagnosis not present

## 2020-11-18 DIAGNOSIS — G309 Alzheimer's disease, unspecified: Secondary | ICD-10-CM | POA: Diagnosis not present

## 2020-11-18 DIAGNOSIS — I10 Essential (primary) hypertension: Secondary | ICD-10-CM | POA: Diagnosis not present

## 2020-11-21 DIAGNOSIS — L03115 Cellulitis of right lower limb: Secondary | ICD-10-CM | POA: Diagnosis not present

## 2020-11-21 DIAGNOSIS — M069 Rheumatoid arthritis, unspecified: Secondary | ICD-10-CM | POA: Diagnosis not present

## 2020-11-21 DIAGNOSIS — I7300 Raynaud's syndrome without gangrene: Secondary | ICD-10-CM | POA: Diagnosis not present

## 2020-11-21 DIAGNOSIS — I872 Venous insufficiency (chronic) (peripheral): Secondary | ICD-10-CM | POA: Diagnosis not present

## 2020-11-21 DIAGNOSIS — I129 Hypertensive chronic kidney disease with stage 1 through stage 4 chronic kidney disease, or unspecified chronic kidney disease: Secondary | ICD-10-CM | POA: Diagnosis not present

## 2020-11-21 DIAGNOSIS — L89152 Pressure ulcer of sacral region, stage 2: Secondary | ICD-10-CM | POA: Diagnosis not present

## 2020-11-21 DIAGNOSIS — N1831 Chronic kidney disease, stage 3a: Secondary | ICD-10-CM | POA: Diagnosis not present

## 2020-11-21 DIAGNOSIS — I495 Sick sinus syndrome: Secondary | ICD-10-CM | POA: Diagnosis not present

## 2020-11-21 DIAGNOSIS — F0280 Dementia in other diseases classified elsewhere without behavioral disturbance: Secondary | ICD-10-CM | POA: Diagnosis not present

## 2020-11-21 DIAGNOSIS — M810 Age-related osteoporosis without current pathological fracture: Secondary | ICD-10-CM | POA: Diagnosis not present

## 2020-11-21 DIAGNOSIS — G629 Polyneuropathy, unspecified: Secondary | ICD-10-CM | POA: Diagnosis not present

## 2020-11-21 DIAGNOSIS — G309 Alzheimer's disease, unspecified: Secondary | ICD-10-CM | POA: Diagnosis not present

## 2020-11-21 DIAGNOSIS — M1990 Unspecified osteoarthritis, unspecified site: Secondary | ICD-10-CM | POA: Diagnosis not present

## 2020-11-21 DIAGNOSIS — M5412 Radiculopathy, cervical region: Secondary | ICD-10-CM | POA: Diagnosis not present

## 2020-11-21 DIAGNOSIS — M4802 Spinal stenosis, cervical region: Secondary | ICD-10-CM | POA: Diagnosis not present

## 2020-11-21 DIAGNOSIS — L03116 Cellulitis of left lower limb: Secondary | ICD-10-CM | POA: Diagnosis not present

## 2020-11-28 ENCOUNTER — Ambulatory Visit (INDEPENDENT_AMBULATORY_CARE_PROVIDER_SITE_OTHER): Payer: Medicare Other

## 2020-11-28 DIAGNOSIS — I495 Sick sinus syndrome: Secondary | ICD-10-CM

## 2020-11-29 DIAGNOSIS — L03818 Cellulitis of other sites: Secondary | ICD-10-CM | POA: Diagnosis not present

## 2020-12-01 LAB — CUP PACEART REMOTE DEVICE CHECK
Battery Remaining Longevity: 40 mo
Battery Voltage: 2.94 V
Brady Statistic AP VP Percent: 28.14 %
Brady Statistic AP VS Percent: 0.01 %
Brady Statistic AS VP Percent: 71.49 %
Brady Statistic AS VS Percent: 0.36 %
Brady Statistic RA Percent Paced: 28.17 %
Brady Statistic RV Percent Paced: 99.63 %
Date Time Interrogation Session: 20220722020824
Implantable Lead Implant Date: 20020104
Implantable Lead Implant Date: 20020104
Implantable Lead Location: 753859
Implantable Lead Location: 753860
Implantable Lead Model: 5076
Implantable Pulse Generator Implant Date: 20180928
Lead Channel Impedance Value: 266 Ohm
Lead Channel Impedance Value: 304 Ohm
Lead Channel Impedance Value: 342 Ohm
Lead Channel Impedance Value: 380 Ohm
Lead Channel Pacing Threshold Amplitude: 0.625 V
Lead Channel Pacing Threshold Amplitude: 1.625 V
Lead Channel Pacing Threshold Pulse Width: 0.4 ms
Lead Channel Pacing Threshold Pulse Width: 0.4 ms
Lead Channel Sensing Intrinsic Amplitude: 0.5 mV
Lead Channel Sensing Intrinsic Amplitude: 0.5 mV
Lead Channel Sensing Intrinsic Amplitude: 3.125 mV
Lead Channel Sensing Intrinsic Amplitude: 3.125 mV
Lead Channel Setting Pacing Amplitude: 1.5 V
Lead Channel Setting Pacing Amplitude: 3.5 V
Lead Channel Setting Pacing Pulse Width: 0.4 ms
Lead Channel Setting Sensing Sensitivity: 0.9 mV

## 2020-12-18 DIAGNOSIS — I129 Hypertensive chronic kidney disease with stage 1 through stage 4 chronic kidney disease, or unspecified chronic kidney disease: Secondary | ICD-10-CM | POA: Diagnosis not present

## 2020-12-18 DIAGNOSIS — N1831 Chronic kidney disease, stage 3a: Secondary | ICD-10-CM | POA: Diagnosis not present

## 2020-12-18 DIAGNOSIS — L03115 Cellulitis of right lower limb: Secondary | ICD-10-CM | POA: Diagnosis not present

## 2020-12-21 DIAGNOSIS — N1831 Chronic kidney disease, stage 3a: Secondary | ICD-10-CM | POA: Diagnosis not present

## 2020-12-21 DIAGNOSIS — G309 Alzheimer's disease, unspecified: Secondary | ICD-10-CM | POA: Diagnosis not present

## 2020-12-21 DIAGNOSIS — M4802 Spinal stenosis, cervical region: Secondary | ICD-10-CM | POA: Diagnosis not present

## 2020-12-21 DIAGNOSIS — I872 Venous insufficiency (chronic) (peripheral): Secondary | ICD-10-CM | POA: Diagnosis not present

## 2020-12-21 DIAGNOSIS — M069 Rheumatoid arthritis, unspecified: Secondary | ICD-10-CM | POA: Diagnosis not present

## 2020-12-21 DIAGNOSIS — L03115 Cellulitis of right lower limb: Secondary | ICD-10-CM | POA: Diagnosis not present

## 2020-12-21 DIAGNOSIS — M1990 Unspecified osteoarthritis, unspecified site: Secondary | ICD-10-CM | POA: Diagnosis not present

## 2020-12-21 DIAGNOSIS — I129 Hypertensive chronic kidney disease with stage 1 through stage 4 chronic kidney disease, or unspecified chronic kidney disease: Secondary | ICD-10-CM | POA: Diagnosis not present

## 2020-12-21 DIAGNOSIS — I7300 Raynaud's syndrome without gangrene: Secondary | ICD-10-CM | POA: Diagnosis not present

## 2020-12-21 DIAGNOSIS — G629 Polyneuropathy, unspecified: Secondary | ICD-10-CM | POA: Diagnosis not present

## 2020-12-21 DIAGNOSIS — M5412 Radiculopathy, cervical region: Secondary | ICD-10-CM | POA: Diagnosis not present

## 2020-12-21 DIAGNOSIS — I495 Sick sinus syndrome: Secondary | ICD-10-CM | POA: Diagnosis not present

## 2020-12-21 DIAGNOSIS — M810 Age-related osteoporosis without current pathological fracture: Secondary | ICD-10-CM | POA: Diagnosis not present

## 2020-12-21 DIAGNOSIS — F0280 Dementia in other diseases classified elsewhere without behavioral disturbance: Secondary | ICD-10-CM | POA: Diagnosis not present

## 2020-12-21 DIAGNOSIS — L03116 Cellulitis of left lower limb: Secondary | ICD-10-CM | POA: Diagnosis not present

## 2020-12-21 DIAGNOSIS — L89152 Pressure ulcer of sacral region, stage 2: Secondary | ICD-10-CM | POA: Diagnosis not present

## 2020-12-23 DIAGNOSIS — R3989 Other symptoms and signs involving the genitourinary system: Secondary | ICD-10-CM | POA: Diagnosis not present

## 2020-12-23 NOTE — Progress Notes (Signed)
Remote pacemaker transmission.   

## 2020-12-24 DIAGNOSIS — R300 Dysuria: Secondary | ICD-10-CM | POA: Diagnosis not present

## 2020-12-29 ENCOUNTER — Encounter: Payer: Medicare Other | Admitting: Student

## 2020-12-30 ENCOUNTER — Ambulatory Visit: Payer: Medicare Other | Admitting: Internal Medicine

## 2020-12-30 DIAGNOSIS — N3000 Acute cystitis without hematuria: Secondary | ICD-10-CM | POA: Diagnosis not present

## 2020-12-30 DIAGNOSIS — I10 Essential (primary) hypertension: Secondary | ICD-10-CM | POA: Diagnosis not present

## 2020-12-30 DIAGNOSIS — F0280 Dementia in other diseases classified elsewhere without behavioral disturbance: Secondary | ICD-10-CM | POA: Diagnosis not present

## 2020-12-30 DIAGNOSIS — G309 Alzheimer's disease, unspecified: Secondary | ICD-10-CM | POA: Diagnosis not present

## 2021-02-27 ENCOUNTER — Ambulatory Visit (INDEPENDENT_AMBULATORY_CARE_PROVIDER_SITE_OTHER)

## 2021-02-27 DIAGNOSIS — I495 Sick sinus syndrome: Secondary | ICD-10-CM | POA: Diagnosis not present

## 2021-03-01 LAB — CUP PACEART REMOTE DEVICE CHECK
Battery Remaining Longevity: 32 mo
Battery Voltage: 2.93 V
Brady Statistic AP VP Percent: 20.34 %
Brady Statistic AP VS Percent: 0.03 %
Brady Statistic AS VP Percent: 78.15 %
Brady Statistic AS VS Percent: 1.48 %
Brady Statistic RA Percent Paced: 20.42 %
Brady Statistic RV Percent Paced: 98.11 %
Date Time Interrogation Session: 20221021021745
Implantable Lead Implant Date: 20020104
Implantable Lead Implant Date: 20020104
Implantable Lead Location: 753859
Implantable Lead Location: 753860
Implantable Lead Model: 5076
Implantable Pulse Generator Implant Date: 20180928
Lead Channel Impedance Value: 285 Ohm
Lead Channel Impedance Value: 323 Ohm
Lead Channel Impedance Value: 342 Ohm
Lead Channel Impedance Value: 380 Ohm
Lead Channel Pacing Threshold Amplitude: 0.75 V
Lead Channel Pacing Threshold Amplitude: 1.75 V
Lead Channel Pacing Threshold Pulse Width: 0.4 ms
Lead Channel Pacing Threshold Pulse Width: 0.4 ms
Lead Channel Sensing Intrinsic Amplitude: 0.75 mV
Lead Channel Sensing Intrinsic Amplitude: 0.75 mV
Lead Channel Sensing Intrinsic Amplitude: 1.75 mV
Lead Channel Sensing Intrinsic Amplitude: 1.75 mV
Lead Channel Setting Pacing Amplitude: 1.5 V
Lead Channel Setting Pacing Amplitude: 3.5 V
Lead Channel Setting Pacing Pulse Width: 0.4 ms
Lead Channel Setting Sensing Sensitivity: 0.9 mV

## 2021-03-04 ENCOUNTER — Encounter: Payer: Self-pay | Admitting: Internal Medicine

## 2021-03-04 ENCOUNTER — Ambulatory Visit (INDEPENDENT_AMBULATORY_CARE_PROVIDER_SITE_OTHER): Admitting: Internal Medicine

## 2021-03-04 DIAGNOSIS — Z950 Presence of cardiac pacemaker: Secondary | ICD-10-CM | POA: Diagnosis not present

## 2021-03-04 DIAGNOSIS — I495 Sick sinus syndrome: Secondary | ICD-10-CM | POA: Diagnosis not present

## 2021-03-04 DIAGNOSIS — I10 Essential (primary) hypertension: Secondary | ICD-10-CM

## 2021-03-04 NOTE — Progress Notes (Signed)
Electrophysiology TeleHealth Note   Due to national recommendations of social distancing due to COVID 19, an audio/video telehealth visit is felt to be most appropriate for this patient at this time.  See MyChart message from today for the patient's consent to telehealth for Alexis Cobb.  Date:  03/04/2021   ID:  Alexis Cobb, DOB Jun 29, 1924, MRN [id number]  Location: patient's home  Provider location: [address] [place]  Evaluation Performed: Follow-up visit  PCP:  Corwin Levins, MD  Cardiologist:  None  Electrophysiologist:  Dr Ladona Ridgel  Chief Complaint:  History provided by her care giver. No complaints.  History of Present Illness:    Alexis Cobb is a 87 y.o. female who presents via audio/video conferencing for a telehealth visit today.  Since last being seen in our clinic, the patient has developed worsening dementia. She is poorly ambulatory but in good spirits. She has not been bothered by covid. She does not have CHF symptoms and denies chest pain. No edema.  Past Medical History:  Diagnosis Date   Arthritis    ARTHRITIS, RHEUMATOID, HX OF 01/06/2007   Cervical stenosis of spine    HTN (hypertension)    Hyperlipidemia    MITRAL REGURGITATION 01/06/2007   OSTEOARTHRITIS 01/14/2007   OSTEOPOROSIS 01/14/2007   Other specified forms of hearing loss 08/01/2009   PACEMAKER, PERMANENT 01/06/2007   Raynaud's syndrome 01/06/2007    Past Surgical History:  Procedure Laterality Date   ABDOMINAL HYSTERECTOMY     APPENDECTOMY     CATARACT EXTRACTION, BILATERAL     MITRAL VALVE REPAIR     ovarian tumor, benign     PACEMAKER INSERTION     PPM GENERATOR CHANGEOUT N/A 02/04/2017   Procedure: PPM GENERATOR CHANGEOUT;  Surgeon: Marinus Maw, MD;  Location: [facility];  Service: Cardiovascular;  Laterality: N/A;    Current Outpatient Medications  Medication Sig Dispense Refill   ACETAMINOPHEN EXTRA STRENGTH 500 MG tablet TAKE 2 TABS  (1000MG ) BY MOUTH TWICE DAILY AND TAKE 1 TAB (500MG ) BY MOUTH EVERY 12 HOURS AS NEEDED FOR PAIN 90 tablet 10   Amino Acids-Protein Hydrolys (FEEDING SUPPLEMENT, PRO-STAT SUGAR FREE 64,) LIQD Take 30 mLs by mouth daily.     aspirin EC 81 MG tablet Take 81 mg by mouth at bedtime.     ASPIRIN LOW DOSE 81 MG chewable tablet GIVE 1 TAB BY MOUTH AT BEDTIME FOR CIRCULATION 30 tablet 9   Calcium Carbonate-Vitamin D 600-400 MG-UNIT tablet Take 1 tablet by mouth 2 (two) times daily.     cholecalciferol (VITAMIN D) 1000 units tablet Take 1,000 Units by mouth daily.     clindamycin (CLEOCIN) 300 MG capsule Take 1 capsule (300 mg total) by mouth 3 (three) times daily. X 7 days 21 capsule 0   feeding supplement (BOOST / RESOURCE BREEZE) LIQD Take 237 mLs by mouth 2 (two) times daily.     furosemide (LASIX) 20 MG tablet Take 20 mg by mouth daily.     losartan (COZAAR) 100 MG tablet Take 1 tablet (100 mg total) by mouth daily. Take 100 mg by mouth. 90 tablet 3   lovastatin (MEVACOR) 40 MG tablet Take 40 mg by mouth daily.     metoprolol succinate (TOPROL-XL) 25 MG 24 hr tablet Take 2 tablets (50 mg total) by mouth daily. Take with or immediately following a meal. 90 tablet 3   Multiple Vitamin (MULTIVITAMIN WITH MINERALS) TABS tablet Take 1 tablet  by mouth daily.     nitroGLYCERIN (NITROSTAT) 0.4 MG SL tablet TAKE 1 TAB SUBLINGUALLY EVERY 5 MINUTES FOR 3 DOSES AS NEEDED FOR CHEST PAIN. IF NO RELIEF CALL MD. * NOT TO EXCEED 3TABS/24HRS* 25 tablet 10   No current facility-administered medications for this visit.    Allergies:   Ace inhibitors, Cephalexin, Penicillins, and Risedronate sodium   Social History:  The patient  reports that she has never smoked. She has never used smokeless tobacco. She reports that she does not drink alcohol and does not use drugs.   Family History:  The patient's family history includes Coronary artery disease in an other family member; Hypertension in an other family member.    ROS:  Please see the history of present illness.   All other systems are personally reviewed and negative.    Exam:    Vital Signs:  There were no vitals taken for this visit.    Labs/Other Tests and Data Reviewed:    Recent Labs: 06/30/2020: TSH 2.13 09/19/2020: ALT 13 09/26/2020: BUN 70; Creatinine, Ser 1.50; Hemoglobin 11.4; Platelets 248; Potassium 4.7; Sodium 137   Wt Readings from Last 3 Encounters:  09/19/20 98 lb 8.7 oz (44.7 kg)  09/05/20 93 lb 9.6 oz (42.5 kg)  06/30/20 93 lb (42.2 kg)     Other studies personally reviewed: Additional studies/ records that were reviewed today include:   Review of the above records today demonstrates: as above Prior radiographs: none Last device remote is reviewed from [employer] PDF dated 10/22 which reveals normal device function, no arrhythmias    ASSESSMENT & PLAN:    1.  CHB - she is s/p PPM insertion and asymptomatic.  2. PPM - review of her interrogation demonstrates normal DDD PM function with about 2 years of battery longevity. 3. HTN - her staff at memory care note that her bp has been stable.   COVID 19 screen The patient denies symptoms of COVID 19 at this time.  The importance of social distancing was discussed today.  Follow-up:  as needed Next remote: 3 months  Current medicines are reviewed at length with the patient today.   The patient does not have concerns regarding her medicines.  The following changes were made today:  none  Labs/ tests ordered today include: none No orders of the defined types were placed in this encounter.    Patient Risk:  after full review of this patients clinical status, I feel that they are at moderate risk at this time.  Today, I have spent 12 minutes with the patient with telehealth technology discussing all of the above. Kerney Elbe, MD  03/04/2021 1:09 PM     [facility] [address] [place] [address] [phone]  (office) [phone] (fax)

## 2021-03-04 NOTE — Patient Instructions (Signed)
Medication Instructions:  Your physician recommends that you continue on your current medications as directed. Please refer to the Current Medication list given to you today.  Labwork: None ordered.  Testing/Procedures: None ordered.  Follow-Up: Your physician wants you to follow-up in: one year with Lewayne Bunting, MD or one of the following Advanced Practice Providers on your designated Care Team:   Francis Dowse, [place] Casimiro Needle "Mardelle Matte" Lanna Poche, [place]  Remote monitoring is used to monitor your Pacemaker from home. This monitoring reduces the number of office visits required to check your device to one time per year. It allows [place] to keep an eye on the functioning of your device to ensure it is working properly. You are scheduled for a device check from home on 05/29/2021. You may send your transmission at any time that day. If you have a wireless device, the transmission will be sent automatically. After your physician reviews your transmission, you will receive a postcard with your next transmission date.  Any Other Special Instructions Will Be Listed Below (If Applicable).  If you need a refill on your cardiac medications before your next appointment, please call your pharmacy.

## 2021-03-09 NOTE — Progress Notes (Signed)
Remote pacemaker transmission.   

## 2021-05-29 ENCOUNTER — Ambulatory Visit (INDEPENDENT_AMBULATORY_CARE_PROVIDER_SITE_OTHER)

## 2021-05-29 DIAGNOSIS — I495 Sick sinus syndrome: Secondary | ICD-10-CM

## 2021-05-29 LAB — CUP PACEART REMOTE DEVICE CHECK
Battery Remaining Longevity: 29 mo
Battery Voltage: 2.93 V
Brady Statistic AP VP Percent: 29.9 %
Brady Statistic AP VS Percent: 0.02 %
Brady Statistic AS VP Percent: 69.23 %
Brady Statistic AS VS Percent: 0.85 %
Brady Statistic RA Percent Paced: 29.83 %
Brady Statistic RV Percent Paced: 98.85 %
Date Time Interrogation Session: 20230120011612
Implantable Lead Implant Date: 20020104
Implantable Lead Implant Date: 20020104
Implantable Lead Location: 753859
Implantable Lead Location: 753860
Implantable Lead Model: 5076
Implantable Pulse Generator Implant Date: 20180928
Lead Channel Impedance Value: 285 Ohm
Lead Channel Impedance Value: 323 Ohm
Lead Channel Impedance Value: 342 Ohm
Lead Channel Impedance Value: 399 Ohm
Lead Channel Pacing Threshold Amplitude: 0.625 V
Lead Channel Pacing Threshold Amplitude: 1.875 V
Lead Channel Pacing Threshold Pulse Width: 0.4 ms
Lead Channel Pacing Threshold Pulse Width: 0.4 ms
Lead Channel Sensing Intrinsic Amplitude: 0.75 mV
Lead Channel Sensing Intrinsic Amplitude: 0.75 mV
Lead Channel Sensing Intrinsic Amplitude: 2.625 mV
Lead Channel Sensing Intrinsic Amplitude: 2.625 mV
Lead Channel Setting Pacing Amplitude: 1.5 V
Lead Channel Setting Pacing Amplitude: 3.5 V
Lead Channel Setting Pacing Pulse Width: 0.4 ms
Lead Channel Setting Sensing Sensitivity: 0.9 mV

## 2021-06-11 NOTE — Progress Notes (Signed)
Remote pacemaker transmission.   

## 2021-08-28 ENCOUNTER — Ambulatory Visit (INDEPENDENT_AMBULATORY_CARE_PROVIDER_SITE_OTHER): Payer: Medicare Other

## 2021-08-28 DIAGNOSIS — I495 Sick sinus syndrome: Secondary | ICD-10-CM

## 2021-08-28 LAB — CUP PACEART REMOTE DEVICE CHECK
Battery Remaining Longevity: 27 mo
Battery Voltage: 2.92 V
Brady Statistic AP VP Percent: 17.49 %
Brady Statistic AP VS Percent: 0.02 %
Brady Statistic AS VP Percent: 81.16 %
Brady Statistic AS VS Percent: 1.33 %
Brady Statistic RA Percent Paced: 17.47 %
Brady Statistic RV Percent Paced: 98.64 %
Date Time Interrogation Session: 20230421020358
Implantable Lead Implant Date: 20020104
Implantable Lead Implant Date: 20020104
Implantable Lead Location: 753859
Implantable Lead Location: 753860
Implantable Lead Model: 5076
Implantable Pulse Generator Implant Date: 20180928
Lead Channel Impedance Value: 285 Ohm
Lead Channel Impedance Value: 323 Ohm
Lead Channel Impedance Value: 342 Ohm
Lead Channel Impedance Value: 380 Ohm
Lead Channel Pacing Threshold Amplitude: 0.625 V
Lead Channel Pacing Threshold Amplitude: 2.25 V
Lead Channel Pacing Threshold Pulse Width: 0.4 ms
Lead Channel Pacing Threshold Pulse Width: 0.4 ms
Lead Channel Sensing Intrinsic Amplitude: 0.875 mV
Lead Channel Sensing Intrinsic Amplitude: 0.875 mV
Lead Channel Sensing Intrinsic Amplitude: 5 mV
Lead Channel Sensing Intrinsic Amplitude: 5 mV
Lead Channel Setting Pacing Amplitude: 1.5 V
Lead Channel Setting Pacing Amplitude: 3.5 V
Lead Channel Setting Pacing Pulse Width: 0.4 ms
Lead Channel Setting Sensing Sensitivity: 0.9 mV

## 2021-09-12 IMAGING — CT CT HEAD W/O CM
4 series · 16 of 47 positions shown, 18 images · non-contrast
Comparison: April 30, 2016.

CLINICAL DATA: fall

EXAM:
CT HEAD WITHOUT CONTRAST
CT CERVICAL SPINE WITHOUT CONTRAST
TECHNIQUE: Multidetector CT imaging of the head and cervical spine was
performed following the standard protocol without intravenous
contrast. Multiplanar CT image reconstructions of the cervical spine
were also generated.

[Series 1: head wo · axial · 0.39mm/px · z∈[-196,-76]mm · 7 of 33 slices shown, 9 images]
[im 5/33  brain]
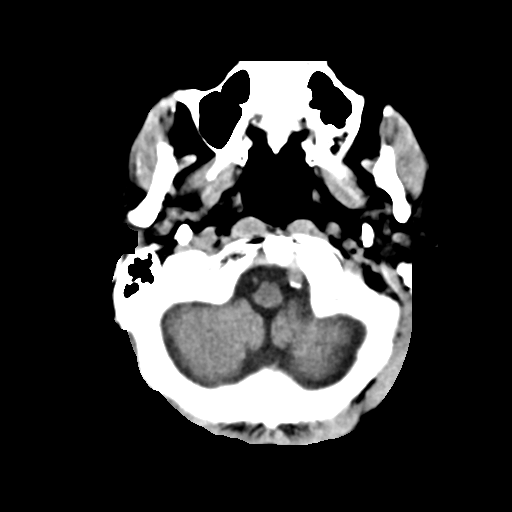
[im 5/33  bone]
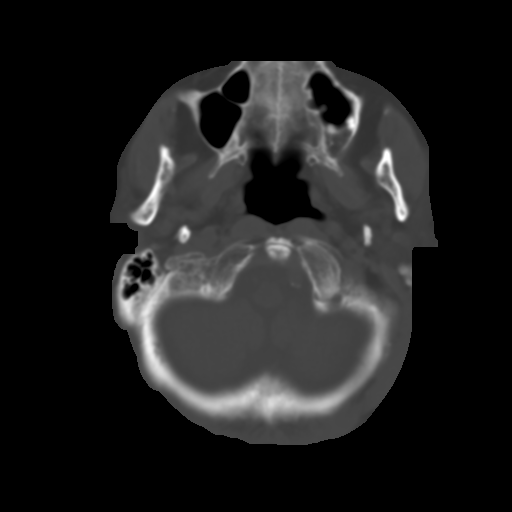
[im 9/33  brain]
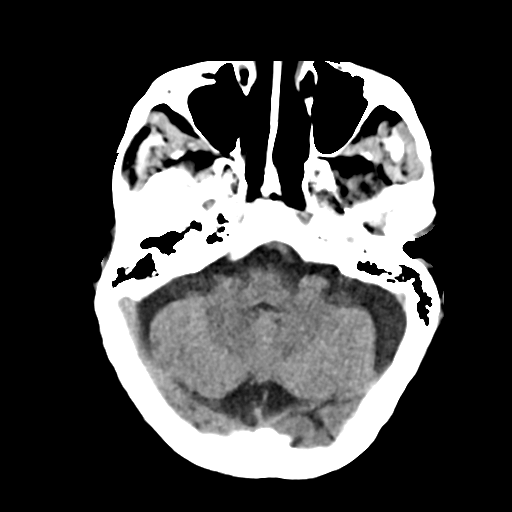
[im 13/33  brain]
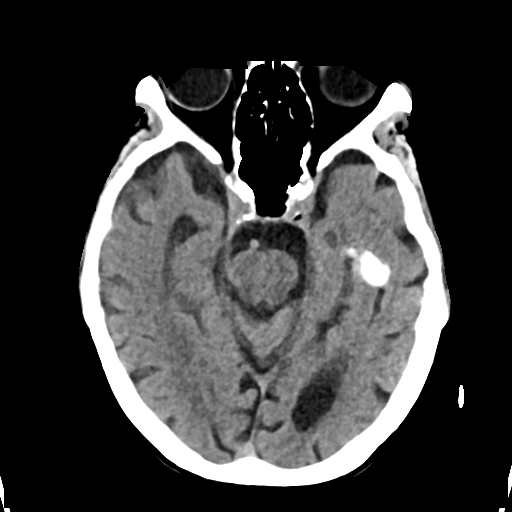
[im 17/33  brain]
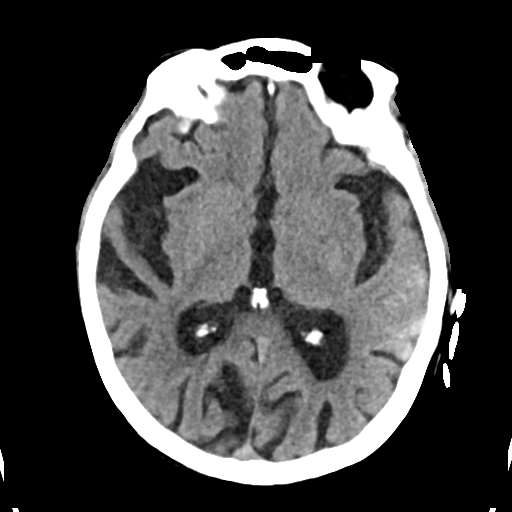
[im 21/33  brain]
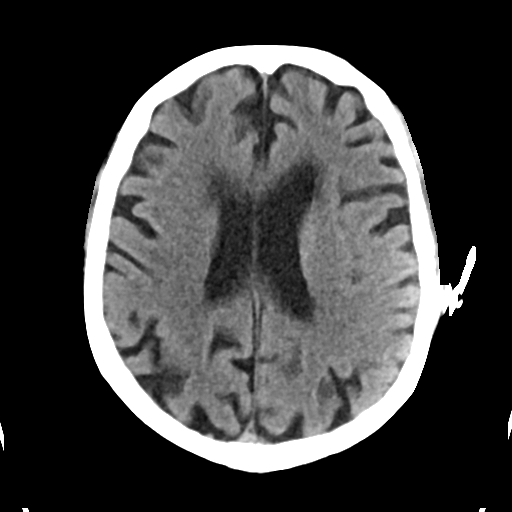
[im 21/33  bone]
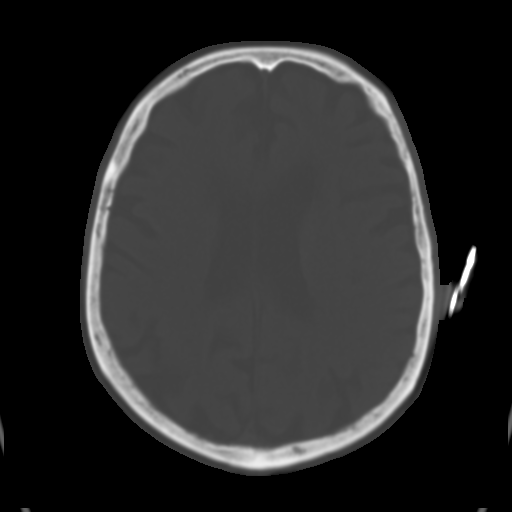
[im 25/33  brain]
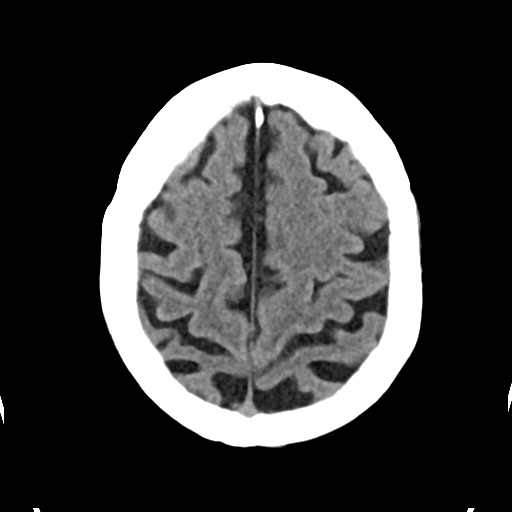
[im 29/33  brain]
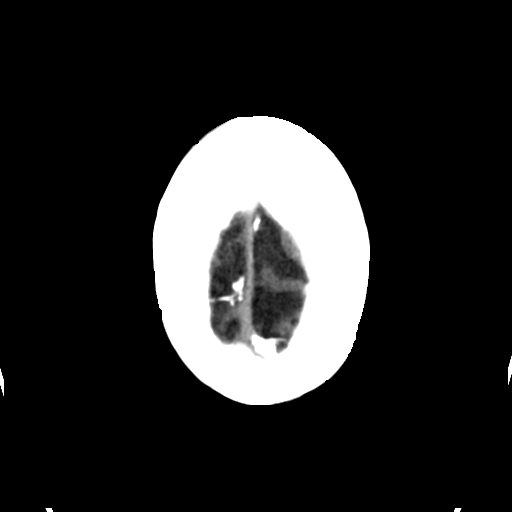

[Series 4: head bone · axial · 0.39mm/px · z∈[-200,-168]mm · 3 of 81 slices shown]
[im 9/81  bone]
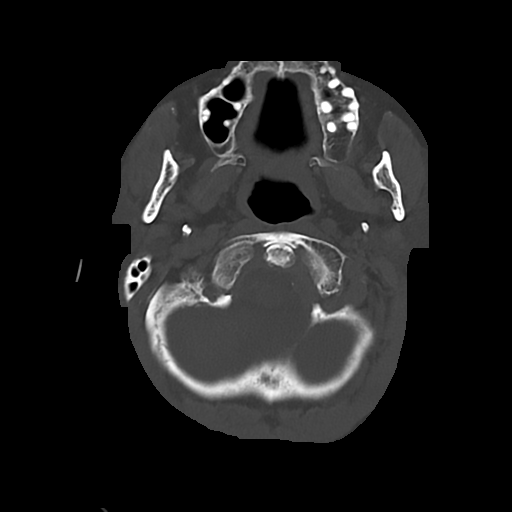
[im 17/81  bone]
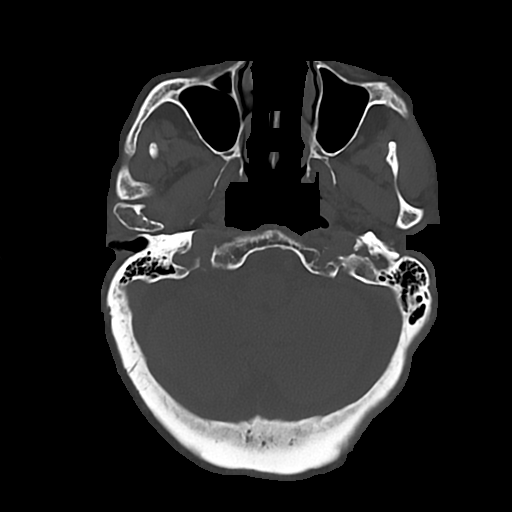
[im 25/81  bone]
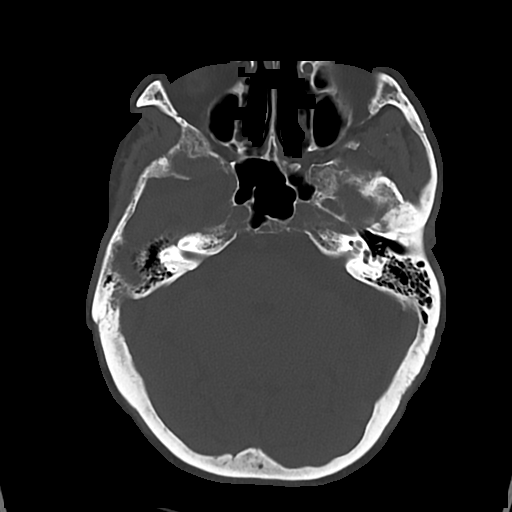

[Series 5: cor soft · coronal · 0.32mm/px · 3 of 66 slices shown]
[im 22/66  brain]
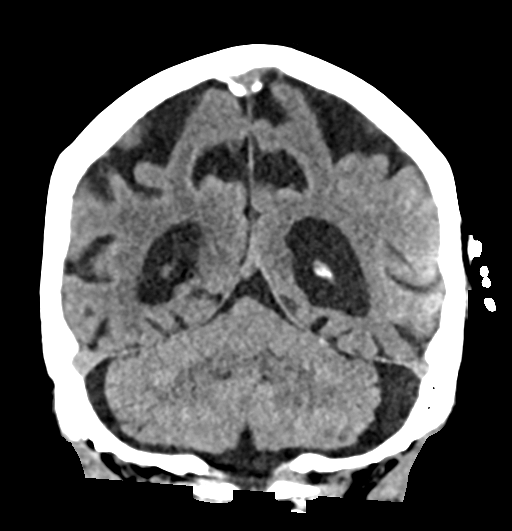
[im 29/66  brain]
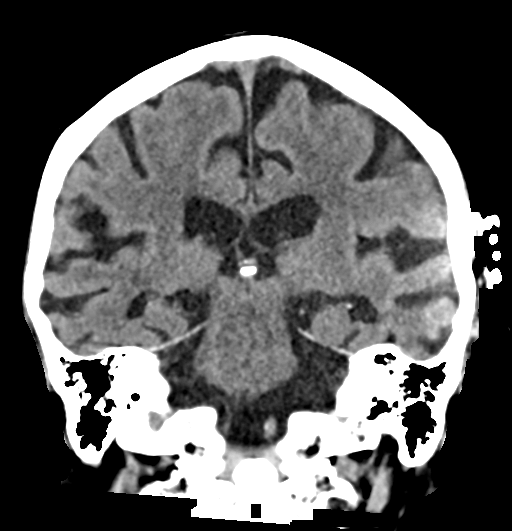
[im 37/66  brain]
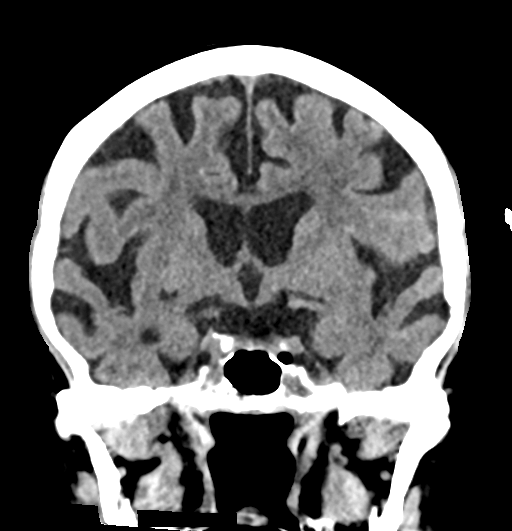

[Series 6: sag soft · sagittal · 0.33mm/px · 3 of 55 slices shown]
[im 19/55  brain]
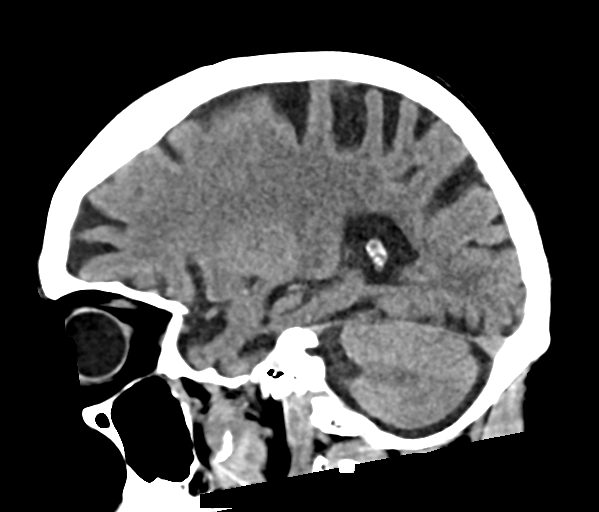
[im 28/55  brain]
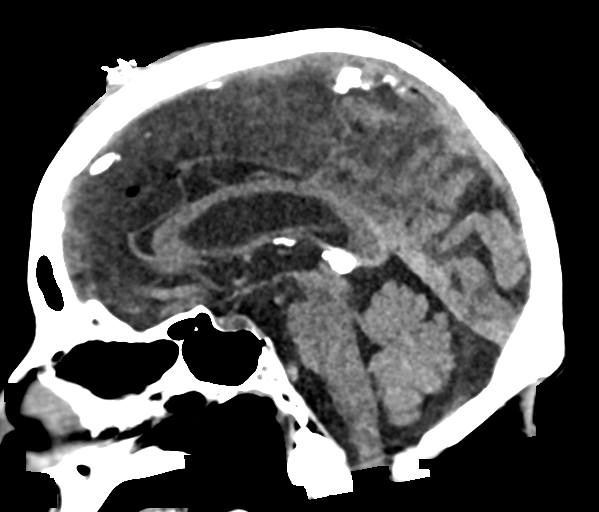
[im 37/55  brain]
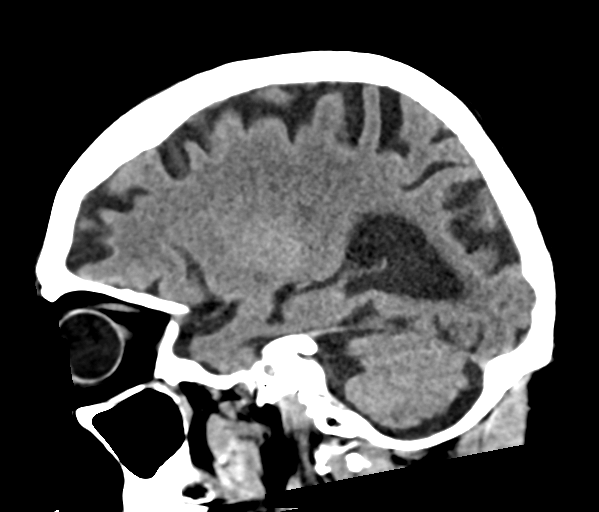

[16 of 47 positions shown; findings below may reference images not displayed]

FINDINGS: CT HEAD FINDINGS

Brain: No evidence of acute large vascular territory infarction,
hemorrhage, hydrocephalus, extra-axial collection or mass
lesion/mass effect. Similar age related global parenchymal volume
loss with ex vacuo dilatation of ventricular system. Stable burden
of chronic microvascular ischemic white matter disease.

Vascular: No hyperdense vessel. Atherosclerotic calcifications of
the internal carotid arteries and vertebral arteries at the skull
base.

Skull: Extra calvarial hematoma closed with skin staples overlying
the frontal vertex. No acute fracture visualized.

Sinuses/Orbits: Visualized paranasal sinuses and mastoid air cells
are predominantly clear.

Other: None

CT CERVICAL SPINE FINDINGS

Alignment: Preservation of the cervical lordosis. No evidence of
traumatic listhesis.

Skull base and vertebrae: No acute fracture.

Soft tissues and spinal canal: No prevertebral fluid or swelling. No
visible canal hematoma.

Disc levels: Multilevel degenerative changes spine with disc space
narrowing osteophytosis and facet hypertrophy.

Upper chest: Biapical pleuroparenchymal scarring.

Other: Carotid artery calcifications.
IMPRESSION: 1. Extra calvarial hematoma closed with skin staples overlying the
frontal vertex. No acute fracture visualized.
2. No evidence of acute intracranial abnormality.
3. Stable age related global parenchymal volume loss and chronic
microvascular ischemic white matter disease.
4. No evidence of acute fracture or traumatic listhesis of the
cervical spine.
5. Multilevel degenerative changes of the cervical spine.

## 2021-09-15 NOTE — Progress Notes (Signed)
Remote pacemaker transmission.   

## 2021-11-30 ENCOUNTER — Ambulatory Visit (INDEPENDENT_AMBULATORY_CARE_PROVIDER_SITE_OTHER): Payer: Medicare Other

## 2021-11-30 DIAGNOSIS — I495 Sick sinus syndrome: Secondary | ICD-10-CM | POA: Diagnosis not present

## 2021-11-30 LAB — CUP PACEART REMOTE DEVICE CHECK
Battery Remaining Longevity: 23 mo
Battery Voltage: 2.91 V
Brady Statistic AP VP Percent: 30.92 %
Brady Statistic AP VS Percent: 0.05 %
Brady Statistic AS VP Percent: 66.98 %
Brady Statistic AS VS Percent: 2.05 %
Brady Statistic RA Percent Paced: 28.9 %
Brady Statistic RV Percent Paced: 93.72 %
Date Time Interrogation Session: 20230724021953
Implantable Lead Implant Date: 20020104
Implantable Lead Implant Date: 20020104
Implantable Lead Location: 753859
Implantable Lead Location: 753860
Implantable Lead Model: 5076
Implantable Pulse Generator Implant Date: 20180928
Lead Channel Impedance Value: 266 Ohm
Lead Channel Impedance Value: 304 Ohm
Lead Channel Impedance Value: 323 Ohm
Lead Channel Impedance Value: 380 Ohm
Lead Channel Pacing Threshold Amplitude: 0.625 V
Lead Channel Pacing Threshold Amplitude: 2.125 V
Lead Channel Pacing Threshold Pulse Width: 0.4 ms
Lead Channel Pacing Threshold Pulse Width: 0.4 ms
Lead Channel Sensing Intrinsic Amplitude: 1 mV
Lead Channel Sensing Intrinsic Amplitude: 1 mV
Lead Channel Sensing Intrinsic Amplitude: 2.375 mV
Lead Channel Sensing Intrinsic Amplitude: 2.375 mV
Lead Channel Setting Pacing Amplitude: 1.5 V
Lead Channel Setting Pacing Amplitude: 3.5 V
Lead Channel Setting Pacing Pulse Width: 0.4 ms
Lead Channel Setting Sensing Sensitivity: 0.9 mV

## 2022-01-01 NOTE — Progress Notes (Signed)
Remote pacemaker transmission.   

## 2022-01-05 DIAGNOSIS — G301 Alzheimer's disease with late onset: Secondary | ICD-10-CM | POA: Diagnosis not present

## 2022-01-05 DIAGNOSIS — I10 Essential (primary) hypertension: Secondary | ICD-10-CM | POA: Diagnosis not present

## 2022-01-05 DIAGNOSIS — F0280 Dementia in other diseases classified elsewhere without behavioral disturbance: Secondary | ICD-10-CM | POA: Diagnosis not present

## 2022-01-05 DIAGNOSIS — W010XXA Fall on same level from slipping, tripping and stumbling without subsequent striking against object, initial encounter: Secondary | ICD-10-CM | POA: Diagnosis not present

## 2022-01-12 DIAGNOSIS — R339 Retention of urine, unspecified: Secondary | ICD-10-CM | POA: Diagnosis not present

## 2022-01-12 DIAGNOSIS — E559 Vitamin D deficiency, unspecified: Secondary | ICD-10-CM | POA: Diagnosis not present

## 2022-01-12 DIAGNOSIS — Z79899 Other long term (current) drug therapy: Secondary | ICD-10-CM | POA: Diagnosis not present

## 2022-01-12 DIAGNOSIS — I10 Essential (primary) hypertension: Secondary | ICD-10-CM | POA: Diagnosis not present

## 2022-01-19 DIAGNOSIS — F02C0 Dementia in other diseases classified elsewhere, severe, without behavioral disturbance, psychotic disturbance, mood disturbance, and anxiety: Secondary | ICD-10-CM | POA: Diagnosis not present

## 2022-01-19 DIAGNOSIS — G301 Alzheimer's disease with late onset: Secondary | ICD-10-CM | POA: Diagnosis not present

## 2022-01-19 DIAGNOSIS — M545 Low back pain, unspecified: Secondary | ICD-10-CM | POA: Diagnosis not present

## 2022-01-19 DIAGNOSIS — I10 Essential (primary) hypertension: Secondary | ICD-10-CM | POA: Diagnosis not present

## 2022-03-01 ENCOUNTER — Ambulatory Visit (INDEPENDENT_AMBULATORY_CARE_PROVIDER_SITE_OTHER): Payer: Medicare Other

## 2022-03-01 DIAGNOSIS — I495 Sick sinus syndrome: Secondary | ICD-10-CM

## 2022-03-02 LAB — CUP PACEART REMOTE DEVICE CHECK
Battery Remaining Longevity: 21 mo
Battery Voltage: 2.9 V
Brady Statistic AP VP Percent: 28.58 %
Brady Statistic AP VS Percent: 0.05 %
Brady Statistic AS VP Percent: 70.03 %
Brady Statistic AS VS Percent: 1.35 %
Brady Statistic RA Percent Paced: 26.77 %
Brady Statistic RV Percent Paced: 94.54 %
Date Time Interrogation Session: 20231024022035
Implantable Lead Connection Status: 753985
Implantable Lead Connection Status: 753985
Implantable Lead Implant Date: 20020104
Implantable Lead Implant Date: 20020104
Implantable Lead Location: 753859
Implantable Lead Location: 753860
Implantable Lead Model: 5076
Implantable Pulse Generator Implant Date: 20180928
Lead Channel Impedance Value: 266 Ohm
Lead Channel Impedance Value: 304 Ohm
Lead Channel Impedance Value: 323 Ohm
Lead Channel Impedance Value: 380 Ohm
Lead Channel Pacing Threshold Amplitude: 0.75 V
Lead Channel Pacing Threshold Amplitude: 2.5 V
Lead Channel Pacing Threshold Pulse Width: 0.4 ms
Lead Channel Pacing Threshold Pulse Width: 0.4 ms
Lead Channel Sensing Intrinsic Amplitude: 1.625 mV
Lead Channel Sensing Intrinsic Amplitude: 1.625 mV
Lead Channel Sensing Intrinsic Amplitude: 1.875 mV
Lead Channel Sensing Intrinsic Amplitude: 1.875 mV
Lead Channel Setting Pacing Amplitude: 1.5 V
Lead Channel Setting Pacing Amplitude: 3.5 V
Lead Channel Setting Pacing Pulse Width: 0.4 ms
Lead Channel Setting Sensing Sensitivity: 0.9 mV
Zone Setting Status: 755011
Zone Setting Status: 755011

## 2022-03-16 DIAGNOSIS — F02C0 Dementia in other diseases classified elsewhere, severe, without behavioral disturbance, psychotic disturbance, mood disturbance, and anxiety: Secondary | ICD-10-CM | POA: Diagnosis not present

## 2022-03-16 DIAGNOSIS — M545 Low back pain, unspecified: Secondary | ICD-10-CM | POA: Diagnosis not present

## 2022-03-16 DIAGNOSIS — G301 Alzheimer's disease with late onset: Secondary | ICD-10-CM | POA: Diagnosis not present

## 2022-03-16 DIAGNOSIS — I10 Essential (primary) hypertension: Secondary | ICD-10-CM | POA: Diagnosis not present

## 2022-03-23 DIAGNOSIS — R54 Age-related physical debility: Secondary | ICD-10-CM | POA: Diagnosis not present

## 2022-03-23 DIAGNOSIS — I739 Peripheral vascular disease, unspecified: Secondary | ICD-10-CM | POA: Diagnosis not present

## 2022-03-23 DIAGNOSIS — E785 Hyperlipidemia, unspecified: Secondary | ICD-10-CM | POA: Diagnosis not present

## 2022-03-26 NOTE — Progress Notes (Signed)
Remote pacemaker transmission.   

## 2022-05-31 ENCOUNTER — Ambulatory Visit: Payer: Medicare Other | Attending: Internal Medicine

## 2022-05-31 DIAGNOSIS — I495 Sick sinus syndrome: Secondary | ICD-10-CM

## 2022-06-01 LAB — CUP PACEART REMOTE DEVICE CHECK
Battery Remaining Longevity: 19 mo
Battery Voltage: 2.88 V
Brady Statistic AP VP Percent: 28.04 %
Brady Statistic AP VS Percent: 0.05 %
Brady Statistic AS VP Percent: 70.67 %
Brady Statistic AS VS Percent: 1.24 %
Brady Statistic RA Percent Paced: 23.68 %
Brady Statistic RV Percent Paced: 89.05 %
Date Time Interrogation Session: 20240122011815
Implantable Lead Connection Status: 753985
Implantable Lead Connection Status: 753985
Implantable Lead Implant Date: 20020104
Implantable Lead Implant Date: 20020104
Implantable Lead Location: 753859
Implantable Lead Location: 753860
Implantable Lead Model: 5076
Implantable Pulse Generator Implant Date: 20180928
Lead Channel Impedance Value: 266 Ohm
Lead Channel Impedance Value: 304 Ohm
Lead Channel Impedance Value: 323 Ohm
Lead Channel Impedance Value: 380 Ohm
Lead Channel Pacing Threshold Amplitude: 0.75 V
Lead Channel Pacing Threshold Amplitude: 2.5 V
Lead Channel Pacing Threshold Pulse Width: 0.4 ms
Lead Channel Pacing Threshold Pulse Width: 0.4 ms
Lead Channel Sensing Intrinsic Amplitude: 0.75 mV
Lead Channel Sensing Intrinsic Amplitude: 0.75 mV
Lead Channel Sensing Intrinsic Amplitude: 1.5 mV
Lead Channel Sensing Intrinsic Amplitude: 1.5 mV
Lead Channel Setting Pacing Amplitude: 1.5 V
Lead Channel Setting Pacing Amplitude: 3.5 V
Lead Channel Setting Pacing Pulse Width: 0.4 ms
Lead Channel Setting Sensing Sensitivity: 0.9 mV
Zone Setting Status: 755011
Zone Setting Status: 755011

## 2022-07-19 NOTE — Progress Notes (Signed)
Remote pacemaker transmission.   

## 2022-07-29 ENCOUNTER — Telehealth: Payer: Self-pay | Admitting: Internal Medicine

## 2022-07-29 DIAGNOSIS — L609 Nail disorder, unspecified: Secondary | ICD-10-CM

## 2022-07-29 NOTE — Telephone Encounter (Signed)
Ok referral done 

## 2022-07-29 NOTE — Telephone Encounter (Signed)
Dr. Dorien Chihuahua with [facility] has requested we start a referral for the pt's podiatry care. States pt lives in a nursing home and has no reliable transportation.   Please contact Dr. Joya Gaskins for additional information  [phone]

## 2022-07-29 NOTE — Telephone Encounter (Signed)
Ok done

## 2022-08-30 ENCOUNTER — Ambulatory Visit: Payer: Medicare Other

## 2022-09-10 ENCOUNTER — Telehealth: Payer: Self-pay | Admitting: Internal Medicine

## 2022-09-10 NOTE — Telephone Encounter (Signed)
LMOVM for Alexis Cobb to give [place] a call. Left detailed message on how to send a manual transmission.

## 2022-09-10 NOTE — Telephone Encounter (Signed)
Marcello Fennel - [facility] called in stating pt is there is assisted living now and her family received message that device was not transmitting. She states she has reconnected the device and would like to know if device has received something it. She would also like to speak to nurse to discuss additional questions/concerns about the device. Please advise.

## 2022-09-13 NOTE — Telephone Encounter (Signed)
Orders per Dr. Lewayne Bunting on 09/13/2022 at 453 pm;  Ok to discontinue monitoring now since pt is in hospice.  Will forward to device for follow up.

## 2022-09-13 NOTE — Telephone Encounter (Signed)
Patient hospice nurse Doreene Burke wants to know if she can get orders for the ppm not to be monitored anymore since she is in hospice. Sherita can be reached at [phone]

## 2022-10-14 ENCOUNTER — Telehealth: Payer: Self-pay

## 2022-10-14 NOTE — Telephone Encounter (Signed)
I spoke with Doreene Burke that per Dr. Ladona Ridgel 09/13/2022 the patient no longer needs to be remotely monitored because she is in hospice.

## 2022-11-29 ENCOUNTER — Ambulatory Visit: Payer: Medicare Other

## 2023-02-28 ENCOUNTER — Ambulatory Visit: Payer: Medicare Other

## 2023-02-28 DIAGNOSIS — L600 Ingrowing nail: Secondary | ICD-10-CM | POA: Diagnosis not present

## 2023-02-28 DIAGNOSIS — I87311 Chronic venous hypertension (idiopathic) with ulcer of right lower extremity: Secondary | ICD-10-CM | POA: Diagnosis not present

## 2023-02-28 DIAGNOSIS — M2041 Other hammer toe(s) (acquired), right foot: Secondary | ICD-10-CM | POA: Diagnosis not present

## 2023-02-28 DIAGNOSIS — M2011 Hallux valgus (acquired), right foot: Secondary | ICD-10-CM | POA: Diagnosis not present

## 2023-02-28 DIAGNOSIS — L84 Corns and callosities: Secondary | ICD-10-CM | POA: Diagnosis not present

## 2023-02-28 DIAGNOSIS — M2012 Hallux valgus (acquired), left foot: Secondary | ICD-10-CM | POA: Diagnosis not present

## 2023-02-28 DIAGNOSIS — M79671 Pain in right foot: Secondary | ICD-10-CM | POA: Diagnosis not present

## 2023-02-28 DIAGNOSIS — M79672 Pain in left foot: Secondary | ICD-10-CM | POA: Diagnosis not present

## 2023-02-28 DIAGNOSIS — I739 Peripheral vascular disease, unspecified: Secondary | ICD-10-CM | POA: Diagnosis not present

## 2023-02-28 DIAGNOSIS — B351 Tinea unguium: Secondary | ICD-10-CM | POA: Diagnosis not present

## 2023-05-30 ENCOUNTER — Ambulatory Visit: Payer: Medicare Other

## 2023-07-22 ENCOUNTER — Inpatient Hospital Stay (HOSPITAL_COMMUNITY)
Admission: EM | Admit: 2023-07-22 | Discharge: 2023-07-26 | DRG: 871 | Disposition: A | Discharge status: Skilled Nursing Facility | Source: Skilled Nursing Facility | Attending: Internal Medicine | Admitting: Internal Medicine

## 2023-07-22 ENCOUNTER — Emergency Department (HOSPITAL_COMMUNITY)

## 2023-07-22 DIAGNOSIS — I700 Atherosclerosis of aorta: Secondary | ICD-10-CM | POA: Diagnosis not present

## 2023-07-22 DIAGNOSIS — L97919 Non-pressure chronic ulcer of unspecified part of right lower leg with unspecified severity: Secondary | ICD-10-CM | POA: Diagnosis present

## 2023-07-22 DIAGNOSIS — N1832 Chronic kidney disease, stage 3b: Secondary | ICD-10-CM | POA: Diagnosis not present

## 2023-07-22 DIAGNOSIS — J690 Pneumonitis due to inhalation of food and vomit: Secondary | ICD-10-CM | POA: Diagnosis present

## 2023-07-22 DIAGNOSIS — J9601 Acute respiratory failure with hypoxia: Secondary | ICD-10-CM | POA: Diagnosis not present

## 2023-07-22 DIAGNOSIS — M25551 Pain in right hip: Secondary | ICD-10-CM | POA: Diagnosis present

## 2023-07-22 DIAGNOSIS — R0689 Other abnormalities of breathing: Secondary | ICD-10-CM | POA: Diagnosis not present

## 2023-07-22 DIAGNOSIS — I10 Essential (primary) hypertension: Secondary | ICD-10-CM | POA: Diagnosis not present

## 2023-07-22 DIAGNOSIS — G9341 Metabolic encephalopathy: Secondary | ICD-10-CM | POA: Diagnosis not present

## 2023-07-22 DIAGNOSIS — M79661 Pain in right lower leg: Secondary | ICD-10-CM | POA: Diagnosis not present

## 2023-07-22 DIAGNOSIS — K5730 Diverticulosis of large intestine without perforation or abscess without bleeding: Secondary | ICD-10-CM | POA: Diagnosis present

## 2023-07-22 DIAGNOSIS — Z1152 Encounter for screening for COVID-19: Secondary | ICD-10-CM | POA: Diagnosis not present

## 2023-07-22 DIAGNOSIS — F0390 Unspecified dementia without behavioral disturbance: Secondary | ICD-10-CM | POA: Diagnosis not present

## 2023-07-22 DIAGNOSIS — R4182 Altered mental status, unspecified: Secondary | ICD-10-CM | POA: Diagnosis not present

## 2023-07-22 DIAGNOSIS — H9190 Unspecified hearing loss, unspecified ear: Secondary | ICD-10-CM | POA: Diagnosis present

## 2023-07-22 DIAGNOSIS — I4510 Unspecified right bundle-branch block: Secondary | ICD-10-CM | POA: Diagnosis not present

## 2023-07-22 DIAGNOSIS — Z8249 Family history of ischemic heart disease and other diseases of the circulatory system: Secondary | ICD-10-CM

## 2023-07-22 DIAGNOSIS — E870 Hyperosmolality and hypernatremia: Secondary | ICD-10-CM | POA: Diagnosis not present

## 2023-07-22 DIAGNOSIS — I959 Hypotension, unspecified: Secondary | ICD-10-CM | POA: Diagnosis present

## 2023-07-22 DIAGNOSIS — E782 Mixed hyperlipidemia: Secondary | ICD-10-CM | POA: Diagnosis not present

## 2023-07-22 DIAGNOSIS — E8721 Acute metabolic acidosis: Secondary | ICD-10-CM | POA: Diagnosis not present

## 2023-07-22 DIAGNOSIS — R159 Full incontinence of feces: Secondary | ICD-10-CM | POA: Diagnosis present

## 2023-07-22 DIAGNOSIS — Z993 Dependence on wheelchair: Secondary | ICD-10-CM

## 2023-07-22 DIAGNOSIS — M069 Rheumatoid arthritis, unspecified: Secondary | ICD-10-CM | POA: Diagnosis present

## 2023-07-22 DIAGNOSIS — J189 Pneumonia, unspecified organism: Secondary | ICD-10-CM | POA: Diagnosis not present

## 2023-07-22 DIAGNOSIS — A419 Sepsis, unspecified organism: Principal | ICD-10-CM | POA: Diagnosis present

## 2023-07-22 DIAGNOSIS — R6520 Severe sepsis without septic shock: Secondary | ICD-10-CM | POA: Diagnosis present

## 2023-07-22 DIAGNOSIS — M79604 Pain in right leg: Secondary | ICD-10-CM | POA: Diagnosis not present

## 2023-07-22 DIAGNOSIS — R32 Unspecified urinary incontinence: Secondary | ICD-10-CM | POA: Diagnosis present

## 2023-07-22 DIAGNOSIS — Z043 Encounter for examination and observation following other accident: Secondary | ICD-10-CM | POA: Diagnosis not present

## 2023-07-22 DIAGNOSIS — Z7189 Other specified counseling: Secondary | ICD-10-CM | POA: Diagnosis not present

## 2023-07-22 DIAGNOSIS — E16A2 Hypoglycemia level 2: Secondary | ICD-10-CM | POA: Diagnosis not present

## 2023-07-22 DIAGNOSIS — E162 Hypoglycemia, unspecified: Secondary | ICD-10-CM | POA: Diagnosis not present

## 2023-07-22 DIAGNOSIS — I4891 Unspecified atrial fibrillation: Secondary | ICD-10-CM | POA: Diagnosis not present

## 2023-07-22 DIAGNOSIS — R0902 Hypoxemia: Secondary | ICD-10-CM | POA: Diagnosis not present

## 2023-07-22 DIAGNOSIS — M79605 Pain in left leg: Secondary | ICD-10-CM | POA: Diagnosis not present

## 2023-07-22 DIAGNOSIS — E860 Dehydration: Secondary | ICD-10-CM | POA: Diagnosis present

## 2023-07-22 DIAGNOSIS — I129 Hypertensive chronic kidney disease with stage 1 through stage 4 chronic kidney disease, or unspecified chronic kidney disease: Secondary | ICD-10-CM | POA: Diagnosis present

## 2023-07-22 DIAGNOSIS — E785 Hyperlipidemia, unspecified: Secondary | ICD-10-CM | POA: Diagnosis present

## 2023-07-22 DIAGNOSIS — Z515 Encounter for palliative care: Secondary | ICD-10-CM | POA: Diagnosis not present

## 2023-07-22 DIAGNOSIS — Z950 Presence of cardiac pacemaker: Secondary | ICD-10-CM | POA: Diagnosis not present

## 2023-07-22 DIAGNOSIS — R000 Tachycardia, unspecified: Secondary | ICD-10-CM | POA: Diagnosis not present

## 2023-07-22 DIAGNOSIS — Z90710 Acquired absence of both cervix and uterus: Secondary | ICD-10-CM

## 2023-07-22 DIAGNOSIS — F03C0 Unspecified dementia, severe, without behavioral disturbance, psychotic disturbance, mood disturbance, and anxiety: Secondary | ICD-10-CM | POA: Diagnosis not present

## 2023-07-22 DIAGNOSIS — S3993XA Unspecified injury of pelvis, initial encounter: Secondary | ICD-10-CM | POA: Diagnosis not present

## 2023-07-22 DIAGNOSIS — Z66 Do not resuscitate: Secondary | ICD-10-CM | POA: Diagnosis present

## 2023-07-22 DIAGNOSIS — M810 Age-related osteoporosis without current pathological fracture: Secondary | ICD-10-CM | POA: Diagnosis present

## 2023-07-22 LAB — COMPREHENSIVE METABOLIC PANEL
ALT: 15 U/L (ref 0–44)
AST: 31 U/L (ref 15–41)
Albumin: 2.1 g/dL — ABNORMAL LOW (ref 3.5–5.0)
Alkaline Phosphatase: 90 U/L (ref 38–126)
Anion gap: 9 (ref 5–15)
BUN: 32 mg/dL — ABNORMAL HIGH (ref 8–23)
CO2: 23 mmol/L (ref 22–32)
Calcium: 8.7 mg/dL — ABNORMAL LOW (ref 8.9–10.3)
Chloride: 114 mmol/L — ABNORMAL HIGH (ref 98–111)
Creatinine, Ser: 1.47 mg/dL — ABNORMAL HIGH (ref 0.44–1.00)
GFR, Estimated: 32 mL/min — ABNORMAL LOW (ref >60)
Glucose, Bld: 94 mg/dL (ref 70–99)
Potassium: 4.2 mmol/L (ref 3.5–5.1)
Sodium: 146 mmol/L — ABNORMAL HIGH (ref 135–145)
Total Bilirubin: 0.7 mg/dL (ref 0.0–1.2)
Total Protein: 5.7 g/dL — ABNORMAL LOW (ref 6.5–8.1)

## 2023-07-22 LAB — CBC WITH DIFFERENTIAL/PLATELET
Abs Immature Granulocytes: 0.1 10*3/uL — ABNORMAL HIGH (ref 0.00–0.07)
Basophils Absolute: 0.1 10*3/uL (ref 0.0–0.1)
Basophils Relative: 0 %
Eosinophils Absolute: 0 10*3/uL (ref 0.0–0.5)
Eosinophils Relative: 0 %
HCT: 36.2 % (ref 36.0–46.0)
Hemoglobin: 11.9 g/dL — ABNORMAL LOW (ref 12.0–15.0)
Immature Granulocytes: 1 %
Lymphocytes Relative: 13 %
Lymphs Abs: 1.5 10*3/uL (ref 0.7–4.0)
MCH: 31.6 pg (ref 26.0–34.0)
MCHC: 32.9 g/dL (ref 30.0–36.0)
MCV: 96.3 fL (ref 80.0–100.0)
Monocytes Absolute: 0.6 10*3/uL (ref 0.1–1.0)
Monocytes Relative: 5 %
Neutro Abs: 9.3 10*3/uL — ABNORMAL HIGH (ref 1.7–7.7)
Neutrophils Relative %: 81 %
Platelets: 335 10*3/uL (ref 150–400)
RBC: 3.76 MIL/uL — ABNORMAL LOW (ref 3.87–5.11)
RDW: 18.6 % — ABNORMAL HIGH (ref 11.5–15.5)
WBC: 11.6 10*3/uL — ABNORMAL HIGH (ref 4.0–10.5)
nRBC: 0.3 % — ABNORMAL HIGH (ref 0.0–0.2)

## 2023-07-22 LAB — RESP PANEL BY RT-PCR (RSV, FLU A&B, COVID)  RVPGX2
Influenza A by PCR: NEGATIVE
Influenza B by PCR: NEGATIVE
Resp Syncytial Virus by PCR: NEGATIVE
SARS Coronavirus 2 by RT PCR: NEGATIVE

## 2023-07-22 LAB — APTT: aPTT: 34 s (ref 24–36)

## 2023-07-22 LAB — PROTIME-INR
INR: 1.2 (ref 0.8–1.2)
Prothrombin Time: 15.5 s — ABNORMAL HIGH (ref 11.4–15.2)

## 2023-07-22 LAB — I-STAT CG4 LACTIC ACID, ED
Lactic Acid, Venous: 2.2 mmol/L (ref 0.5–1.9)
Lactic Acid, Venous: 1.1 mmol/L (ref 0.5–1.9)

## 2023-07-22 MED ORDER — VANCOMYCIN HCL IN DEXTROSE 1-5 GM/200ML-% IV SOLN
1000.0000 mg | Freq: Once | INTRAVENOUS | Status: AC
  Administered 2023-07-22: 1000 mg via INTRAVENOUS
  Filled 2023-07-22: qty 200

## 2023-07-22 MED ORDER — SODIUM CHLORIDE 0.9 % IV SOLN
2.0000 g | Freq: Once | INTRAVENOUS | Status: AC
  Administered 2023-07-22: 2 g via INTRAVENOUS
  Filled 2023-07-22: qty 12.5

## 2023-07-22 MED ORDER — METRONIDAZOLE 500 MG/100ML IV SOLN
500.0000 mg | Freq: Once | INTRAVENOUS | Status: AC
  Administered 2023-07-22: 500 mg via INTRAVENOUS
  Filled 2023-07-22: qty 100

## 2023-07-22 MED ORDER — HEPARIN SODIUM (PORCINE) 5000 UNIT/ML IJ SOLN
5000.0000 [IU] | Freq: Three times a day (TID) | INTRAMUSCULAR | Status: DC
  Administered 2023-07-23 – 2023-07-26 (×11): 5000 [IU] via SUBCUTANEOUS
  Filled 2023-07-22 (×11): qty 1

## 2023-07-22 MED ORDER — LACTATED RINGERS IV SOLN
150.0000 mL/h | INTRAVENOUS | Status: DC
  Administered 2023-07-23: 75 mL/h via INTRAVENOUS

## 2023-07-22 MED ORDER — SODIUM CHLORIDE 0.9 % IV SOLN
500.0000 mg | INTRAVENOUS | Status: DC
  Administered 2023-07-23 – 2023-07-26 (×4): 500 mg via INTRAVENOUS
  Filled 2023-07-22 (×5): qty 5

## 2023-07-22 MED ORDER — AZTREONAM 2 G IJ SOLR: 2.0000 g | Freq: Once | INTRAMUSCULAR | Status: DC

## 2023-07-22 NOTE — Sepsis Progress Note (Signed)
 Elink will follow per sepsis protocol.

## 2023-07-22 NOTE — ED Provider Notes (Addendum)
 [place] [facility] AT [facility] Provider Note   CSN: [id number] Arrival date & time: 07/22/23  1628     History  No chief complaint on file.   Alexis Cobb is a 89 y.o. female.  Patient is a 89 year old female with advanced dementia, hypertension, hyperlipidemia, pacemaker placement who presents with hip pain.  EMS was called out to her nursing facility because reportedly her right hip has been hurting for the last 3 days.  There was no witnessed falls.  When EMS arrived, they noted that she was a bit hypotensive with blood pressures in the 80s as well as hypoxic with oxygen saturations in the upper 80s on room air.  She was also tachycardic and tachypneic.  She felt warm to the touch.  She was given 1 L of normal saline with some improvement in her blood pressure.       Home Medications Prior to Admission medications   Medication Sig Start Date End Date Taking? Authorizing Provider  miconazole (MICOTIN) 2 % powder Apply 1 Application topically 2 (two) times daily as needed (for irritation).   Yes [provider]  acetaminophen (TYLENOL) 325 MG tablet Take 650 mg by mouth every 6 (six) hours as needed for mild pain (pain score 1-3) or fever. Patient not taking: Reported on 07/22/2023    [provider]  ipratropium-albuterol (DUONEB) 0.5-2.5 (3) MG/3ML SOLN Take 3 mLs by nebulization every 6 (six) hours as needed (for shortness of breath or wheezing). Patient not taking: Reported on 07/22/2023    [provider]  nitroGLYCERIN (NITROSTAT) 0.4 MG SL tablet TAKE 1 TAB SUBLINGUALLY EVERY 5 MINUTES FOR 3 DOSES AS NEEDED FOR CHEST PAIN. IF NO RELIEF CALL MD. * NOT TO EXCEED 3TABS/24HRS* Patient not taking: Reported on 07/22/2023 04/09/19   Corwin Levins, MD      Allergies    Sulfa antibiotics, Ace inhibitors, Cephalexin, Penicillins, and Risedronate sodium    Review of Systems   Review of Systems  Unable to perform ROS: Dementia     Physical Exam Updated Vital Signs BP (!) 131/106   Pulse 77   Temp 99.1 F (37.3 C) (Rectal)   Resp (!) 22   SpO2 96%  Physical Exam Constitutional:      Appearance: She is well-developed. She is ill-appearing.  HENT:     Head: Normocephalic and atraumatic.  Eyes:     Pupils: Pupils are equal, round, and reactive to light.  Cardiovascular:     Rate and Rhythm: Regular rhythm. Tachycardia present.     Heart sounds: Normal heart sounds.  Pulmonary:     Effort: Pulmonary effort is normal. No respiratory distress.     Breath sounds: Normal breath sounds. No wheezing or rales.     Comments: Tachypneic Chest:     Chest wall: No tenderness.  Abdominal:     General: Bowel sounds are normal.     Palpations: Abdomen is soft.     Tenderness: There is no abdominal tenderness. There is no guarding or rebound.  Musculoskeletal:        General: Normal range of motion.     Cervical back: Normal range of motion and neck supple.     Comments: Positive tenderness primarily on movement of the right hip.  There is also some tenderness to the right knee and movement of the left hip.  There is small ulceration to her right lower leg.  There is no drainage.  There are some minor  surrounding erythema.  Lymphadenopathy:     Cervical: No cervical adenopathy.  Skin:    General: Skin is warm and dry.     Findings: No rash.  Neurological:     Mental Status: She is alert.     Comments: Awake with eyes open, will not answer questions     ED Results / Procedures / Treatments   Labs (all labs ordered are listed, but only abnormal results are displayed) Labs Reviewed  CBC WITH DIFFERENTIAL/PLATELET - Abnormal; Notable for the following components:      Result Value   WBC 11.6 (*)    RBC 3.76 (*)    Hemoglobin 11.9 (*)    RDW 18.6 (*)    nRBC 0.3 (*)    Neutro Abs 9.3 (*)    Abs Immature Granulocytes 0.10 (*)    All other components within normal limits  PROTIME-INR - Abnormal; Notable for  the following components:   Prothrombin Time 15.5 (*)    All other components within normal limits  COMPREHENSIVE METABOLIC PANEL - Abnormal; Notable for the following components:   Sodium 146 (*)    Chloride 114 (*)    BUN 32 (*)    Creatinine, Ser 1.47 (*)    Calcium 8.7 (*)    Total Protein 5.7 (*)    Albumin 2.1 (*)    GFR, Estimated 32 (*)    All other components within normal limits  I-STAT CG4 LACTIC ACID, ED - Abnormal; Notable for the following components:   Lactic Acid, Venous 2.2 (*)    All other components within normal limits  RESP PANEL BY RT-PCR (RSV, FLU A&B, COVID)  RVPGX2  CULTURE, BLOOD (ROUTINE X 2)  CULTURE, BLOOD (ROUTINE X 2)  APTT  URINALYSIS, W/ REFLEX TO CULTURE (INFECTION SUSPECTED)  I-STAT CG4 LACTIC ACID, ED    EKG EKG Interpretation Date/Time:  Friday July 22 2023 17:09:16 EDT Ventricular Rate:  100 PR Interval:    QRS Duration:  151 QT Interval:  377 QTC Calculation: 487 R Axis:   260  Text Interpretation: Atrial fibrillation Ventricular premature complex Right bundle branch block Inferior infarct, old Probable anterior infarct, age indeterminate Confirmed by Rolan Bucco [phone]) on 07/22/2023 6:02:28 PM  Radiology CT Head Wo Contrast Result Date: 07/22/2023 CLINICAL DATA:  Mental status change, unknown cause EXAM: CT HEAD WITHOUT CONTRAST TECHNIQUE: Contiguous axial images were obtained from the base of the skull through the vertex without intravenous contrast. RADIATION DOSE REDUCTION: This exam was performed according to the departmental dose-optimization program which includes automated exposure control, adjustment of the mA and/or kV according to patient size and/or use of iterative reconstruction technique. COMPARISON:  CT head 09/26/2020 FINDINGS: Brain: Cerebral ventricle sizes are concordant with the degree of cerebral volume loss. Patchy and confluent areas of decreased attenuation are noted throughout the deep and periventricular white  matter of the cerebral hemispheres bilaterally, compatible with chronic microvascular ischemic disease. No evidence of large-territorial acute infarction. No parenchymal hemorrhage. No mass lesion. No extra-axial collection. No mass effect or midline shift. No hydrocephalus. Basilar cisterns are patent. Vascular: No hyperdense vessel. Skull: No acute fracture or focal lesion. Sinuses/Orbits: Paranasal sinuses and mastoid air cells are clear. Bilateral lens replacement. Otherwise the orbits are unremarkable. Other: None. IMPRESSION: No acute intracranial abnormality. Electronically Signed   By: Tish Frederickson M.D.   On: 07/22/2023 20:24   CT PELVIS WO CONTRAST Result Date: 07/22/2023 CLINICAL DATA:  Hip trauma, fracture suspected, xray done Pt very altered  and unable to hold still or properly position for CT scan. Rescanned head due to motion artifact. Best images obtained. EXAM: CT PELVIS WITHOUT CONTRAST TECHNIQUE: Multidetector CT imaging of the pelvis was performed following the standard protocol without intravenous contrast. RADIATION DOSE REDUCTION: This exam was performed according to the departmental dose-optimization program which includes automated exposure control, adjustment of the mA and/or kV according to patient size and/or use of iterative reconstruction technique. COMPARISON:  CT abdomen pelvis 05/04/2016 FINDINGS: Urinary Tract: Pericentimeter right nephrolithiasis. No abnormality visualized. Bowel: Colonic diverticulosis. Limited evaluation due the anal and rectal region due to under distension and noncontrast study. Vascular/Lymphatic: Severe atherosclerotic plaque. No abdominal aorta or iliac aneurysm. Mild atherosclerotic plaque of the aorta and its branches. No abdominal, pelvic, or inguinal lymphadenopathy. Reproductive:  No mass or other significant abnormality Other: Pelvic floor laxity. No intraperitoneal free fluid. No intraperitoneal free gas. No organized fluid collection.  Musculoskeletal: Diffusely decreased bone density. Bilateral hip demonstrates no acute displaced fracture or dislocation. No acute displaced fracture or diastasis of the bones of the pelvis. Old healed bilateral inferior and superior pubic rami fractures. No acute displaced fracture of the sacral bones. IMPRESSION: 1. Diffusely decreased bone density. 2.  Negative for acute traumatic injury. 3. Pericentimeter right nephrolithiasis. 4.  Aortic Atherosclerosis (ICD10-I70.0). Electronically Signed   By: Tish Frederickson M.D.   On: 07/22/2023 20:22   DG Knee 1-2 Views Right Result Date: 07/22/2023 CLINICAL DATA:  99497 Leg pain 69629 EXAM: RIGHT KNEE - 1-2 VIEW COMPARISON:  X-ray left femur 03/28/2019 FINDINGS: No evidence of fracture, dislocation, or joint effusion. Cortical irregularity of the fibular neck suggestive of an old healed fracture. Tricompartmental joint narrowing. Soft tissues are unremarkable. Vascular calcifications. IMPRESSION: No acute displaced fracture or dislocation. Electronically Signed   By: Tish Frederickson M.D.   On: 07/22/2023 19:32   DG Hip Unilat W or Wo Pelvis 2-3 Views Right Result Date: 07/22/2023 CLINICAL DATA:  leg pain.  Fall EXAM: DG HIP (WITH OR WITHOUT PELVIS) 2-3V RIGHT; DG HIP (WITH OR WITHOUT PELVIS) 2-3V LEFT COMPARISON:  X-ray right hip 03/28/2019, x-ray left femur 03/28/2019 FINDINGS: Limited evaluation due to overlapping osseous structures and overlying soft tissues. No acute displaced fracture or diastasis of the bones of the pelvis. No acute displaced fracture or dislocation of the left hip. Abnormal projection of the right hip with query underlying intratrochanteric or femoral neck fracture versus artifact due to positioning. There is no evidence of severe arthropathy or other focal bone abnormality. Vascular calcifications. IMPRESSION: 1. Negative for definite acute traumatic injury. 2. Abnormal projection of the right hip with query underlying intratrochanteric or  femoral neck fracture versus artifact due to positioning. Recommend repeat three-view right hip radiograph. Electronically Signed   By: Tish Frederickson M.D.   On: 07/22/2023 19:30   DG Hip Unilat W or Wo Pelvis 2-3 Views Left Result Date: 07/22/2023 CLINICAL DATA:  leg pain.  Fall EXAM: DG HIP (WITH OR WITHOUT PELVIS) 2-3V RIGHT; DG HIP (WITH OR WITHOUT PELVIS) 2-3V LEFT COMPARISON:  X-ray right hip 03/28/2019, x-ray left femur 03/28/2019 FINDINGS: Limited evaluation due to overlapping osseous structures and overlying soft tissues. No acute displaced fracture or diastasis of the bones of the pelvis. No acute displaced fracture or dislocation of the left hip. Abnormal projection of the right hip with query underlying intratrochanteric or femoral neck fracture versus artifact due to positioning. There is no evidence of severe arthropathy or other focal bone abnormality. Vascular calcifications. IMPRESSION: 1.  Negative for definite acute traumatic injury. 2. Abnormal projection of the right hip with query underlying intratrochanteric or femoral neck fracture versus artifact due to positioning. Recommend repeat three-view right hip radiograph. Electronically Signed   By: Tish Frederickson M.D.   On: 07/22/2023 19:30   DG Chest 1 View Result Date: 07/22/2023 CLINICAL DATA:  99497 Leg pain 16109.  Fall EXAM: CHEST  1 VIEW COMPARISON:  Chest x-ray 09/26/2020 FINDINGS: Minimal overlies the left upper lobe. The heart and mediastinal contours are within normal limits. Left chest wall dual lead pacemaker. Interval development of right lower lung zone airspace opacity. Chronic coarsened interstitial markings with no overt pulmonary edema. No pleural effusion. No pneumothorax. No acute osseous abnormality.  Sternotomy wires are intact. IMPRESSION: 1. Right lower lung zone pneumonia. Followup PA and lateral chest X-ray is recommended in 3-4 weeks following therapy to ensure resolution and exclude underlying malignancy. 2.  Minimal overlies the left upper lobe-left upper lobe only partially visualized/evaluated. 3.  Aortic Atherosclerosis (ICD10-I70.0). Electronically Signed   By: Tish Frederickson M.D.   On: 07/22/2023 19:26    Procedures Procedures    Medications Ordered in ED Medications  vancomycin (VANCOCIN) IVPB 1000 mg/200 mL premix (1,000 mg Intravenous New Bag/Given 07/22/23 2104)  metroNIDAZOLE (FLAGYL) IVPB 500 mg (0 mg Intravenous Stopped 07/22/23 2028)  ceFEPIme (MAXIPIME) 2 g in sodium chloride 0.9 % 100 mL IVPB (0 g Intravenous Stopped 07/22/23 1921)    ED Course/ Medical Decision Making/ A&P                                 Medical Decision Making Amount and/or Complexity of Data Reviewed Labs: ordered. Radiology: ordered.  Risk Prescription drug management. Decision regarding hospitalization.   Patient is a 89 year old female who presents with hip pain.  She is noted to be tachycardic and hypoxic.  Her initial blood pressure was in the 80s.  It did improve with IV fluids.  Her rectal temp was 99.  She was started on IV fluids and IV antibiotics for presumed sepsis.  Her WBC count was elevated.  Her lactate is mildly elevated.  Chest x-ray was interpreted by me and confirmed by the radiologist to show no evidence of pulmonary edema.  There is right lower lobe infiltrate.  She does not have abdominal pain that would be more concerning for an abdominal etiology for the infection.  Her COVID and flu test is negative.  Urinalysis is still pending.  She had imaging of her pelvis which does not show any evidence of hip fracture.  CT of her head does not show any intracranial hemorrhage or other acute abnormality.  Discussed with Dr. Mikeal Hawthorne who will admit the patient for further treatment.  CRITICAL CARE Performed by: Rolan Bucco Total critical care time: 70 minutes Critical care time was exclusive of separately billable procedures and treating other patients. Critical care was necessary to treat  or prevent imminent or life-threatening deterioration. Critical care was time spent personally by me on the following activities: development of treatment plan with patient and/or surrogate as well as nursing, discussions with consultants, evaluation of patient's response to treatment, examination of patient, obtaining history from patient or surrogate, ordering and performing treatments and interventions, ordering and review of laboratory studies, ordering and review of radiographic studies, pulse oximetry and re-evaluation of patient's condition.   Final Clinical Impression(s) / ED Diagnoses Final diagnoses:  Sepsis without acute organ dysfunction,  due to unspecified organism [facility])  Community acquired pneumonia of right lower lobe of lung    Rx / DC Orders ED Discharge Orders     None         Rolan Bucco, MD 07/22/23 1610    Rolan Bucco, MD 07/22/23 2201

## 2023-07-22 NOTE — ED Notes (Addendum)
 Attempted I&O cath for urine specimen. Pt with recessed urethra that was unable to be visualized. I&O unsuccessful. EDP notified.   This RN and Rocky Link, NT present and [place], NT as chaperone.

## 2023-07-22 NOTE — ED Triage Notes (Signed)
 Pt BIB [facility] from [facility] for right hip pain for 3 days, facility not sure if patient had a fall. EMS said patient was hypotensive, hypoxic and tachypnea. Patient initial blood pressure was 80/50, she received LR 1 liter. Her last pressure after the fluid was 116/70, Spo2 89 on RA,  and EMS placed on nasal canula 2L and her saturation was at  93%, blood sugar 135. Patient is oriented to self. She was in [facility] for EMS  and she does have history of dementia.

## 2023-07-22 NOTE — H&P (Signed)
 History and Physical    Patient: Alexis Cobb DOB: Oct 16, 1924 DOA: 07/22/2023 DOS: the patient was seen and examined on 07/22/2023 PCP: Pcp, No  Patient coming from: SNF  Chief Complaint: No chief complaint on file.  HPI: Alexis Cobb is a 89 y.o. female with medical history significant of dementia, hyperlipidemia, essential hypertension, pacemaker in place, osteoarthritis, rheumatoid arthritis, who is on hospice care at [facility] brought in with altered mental status, fever, and right hip pain for 3 days.  Patient did not have any observed fall but she is complaining of pain in the right hip.  When EMS arrived patient was hypotensive, hypoxic and tachypneic.  When she got out of the ER initial blood pressure was 80/50.  After 1 L bolus of LR blood pressure improved to about 110/70.  She is still hypoxic so 2 L of oxygen started.  Patient's family says she is a hospice patient but she is undergoing treatment in case she responds.  Patient was found to meet sepsis criteria with imaging showing pneumonia.  At this point she has been admitted with severe sepsis secondary to pneumonia.   Review of Systems: As mentioned in the history of present illness. All other systems reviewed and are negative. Past Medical History:  Diagnosis Date   Arthritis    ARTHRITIS, RHEUMATOID, HX OF 01/06/2007   Cervical stenosis of spine    HTN (hypertension)    Hyperlipidemia    MITRAL REGURGITATION 01/06/2007   OSTEOARTHRITIS 01/14/2007   OSTEOPOROSIS 01/14/2007   Other specified forms of hearing loss 08/01/2009   PACEMAKER, PERMANENT 01/06/2007   Raynaud's syndrome 01/06/2007   Past Surgical History:  Procedure Laterality Date   ABDOMINAL HYSTERECTOMY     APPENDECTOMY     CATARACT EXTRACTION, BILATERAL     MITRAL VALVE REPAIR     ovarian tumor, benign     PACEMAKER INSERTION     PPM GENERATOR CHANGEOUT N/A 02/04/2017   Procedure: PPM GENERATOR CHANGEOUT;  Surgeon: Marinus Maw,  MD;  Location: [facility] INVASIVE CV LAB;  Service: Cardiovascular;  Laterality: N/A;   Social History:  reports that she has never smoked. She has never used smokeless tobacco. She reports that she does not drink alcohol and does not use drugs.  Allergies  Allergen Reactions   Sulfa Antibiotics     Unknown reaction per MAR   Ace Inhibitors Rash    Not listed on the MAR   Cephalexin Rash   Penicillins Rash and Other (See Comments)    Has patient had a PCN reaction causing immediate rash, facial/tongue/throat swelling, SOB or lightheadedness with hypotension: No Has patient had a PCN reaction causing severe rash involving mucus membranes or skin necrosis: No Has patient had a PCN reaction that required hospitalization No Has patient had a PCN reaction occurring within the last 10 years: No If all of the above answers are "NO", then may proceed with Cephalosporin use.   Risedronate Sodium Rash    Not listed on the [facility]     Family History  Problem Relation Age of Onset   Coronary artery disease Other    Hypertension Other     Prior to Admission medications   Medication Sig Start Date End Date Taking? Authorizing Provider  acetaminophen (TYLENOL) 325 MG tablet Take 650 mg by mouth every 6 (six) hours as needed for mild pain (pain score 1-3) or fever. Patient not taking: Reported on 07/22/2023    [provider]  ipratropium-albuterol (DUONEB)  0.5-2.5 (3) MG/3ML SOLN Take 3 mLs by nebulization every 6 (six) hours as needed (for shortness of breath or wheezing). Patient not taking: Reported on 07/22/2023    [provider]  miconazole (MICOTIN) 2 % powder Apply 1 Application topically 2 (two) times daily as needed (for irritation). Apply under breasts    [provider]  nitroGLYCERIN (NITROSTAT) 0.4 MG SL tablet TAKE 1 TAB SUBLINGUALLY EVERY 5 MINUTES FOR 3 DOSES AS NEEDED FOR CHEST PAIN. IF NO RELIEF CALL MD. * NOT TO EXCEED 3TABS/24HRS* Patient not taking: Reported  on 07/22/2023 04/09/19   Corwin Levins, MD    Physical Exam: Vitals:   07/22/23 1634 07/22/23 1658 07/22/23 2015 07/22/23 2105  BP: (!) 98/54   (!) 131/106  Pulse: 97  (!) 106 77  Resp: 20  (!) 25 (!) 22  Temp: 99.1 F (37.3 C)     TempSrc: Rectal     SpO2: 95% 97% 97% 96%   Constitutional: Frail, chronically ill looking, NAD, calm, comfortable Eyes: PERRL, lids and conjunctivae normal ENMT: Mucous membranes are dry. Posterior pharynx clear of any exudate or lesions.Normal dentition.  Neck: normal, supple, no masses, no thyromegaly Respiratory: clear to auscultation bilaterally, no wheezing, no crackles. Normal respiratory effort. No accessory muscle use.  Cardiovascular: Regular rate and rhythm, no murmurs / rubs / gallops. No extremity edema. 2+ pedal pulses. No carotid bruits.  Abdomen: no tenderness, no masses palpated. No hepatosplenomegaly. Bowel sounds positive.  Musculoskeletal: Good range of motion, no joint swelling or tenderness, Skin: no rashes, lesions, ulcers. No induration Neurologic: CN 2-12 grossly intact. Sensation intact, DTR normal. Strength 5/5 in all 4.  Psychiatric: Patient oriented only in self.  No agitation  Data Reviewed:  Temperature 99.1, blood pressure 98/54, pulse 106 respiratory of 25 and oxygen sats 95% on 2 L.  White count is 11.6 hemoglobin 11.9 creatinine 1.47 sodium 146 lactic acid 2.2 calcium 8.7.  Chest x-ray showed right lower lung pneumonia possibly left lower lobe as well x-ray of the hip showed abnormal projection of the right hip with query underlying intertrochanteric or femoral neck fracture versus artifact due to positioning x-ray of the knee showed no acute findings.  CT of the pelvis shows negative for any acute traumatic injury and then out aortic stenosis.  Head CT showed no acute abnormality  Assessment and Plan:  #1 severe sepsis due to pneumonia: Patient will be admitted and IV antibiotics started.  Lactic acid 2.2.  Will follow  sepsis protocol.  Patient will have limited duration of treatment.  Family want to give a try and see if she responds to antibiotics if not they want to proceed with comfort measures only.  #2 right hip pain: No obvious fracture on CT.  X-ray suggested possible fracture.  Will suggest repeat x-rays in 24 to 48 hours to see if an occult fracture exists.  #3 dementia: No agitation.  Continue supportive care.  #4 acute metabolic encephalopathy: Patient is disoriented except for self.  At this point has sepsis may be worsening her dementia.  Continue to monitor  #5 hyperlipidemia: Resume home regimen when able  #6 essential hypertension: Patient is hypotensive at the moment.  Hold antihypertensives.  #7 hypernatremia: Sodium is elevated probably dehydration.  Attempt hydration and follow sodium level.    Advance Care Planning:   Code Status: Prior DNR and comfort  Consults: None  Family Communication: Niece and nephew at bedside  Severity of Illness: The appropriate patient status for  this patient is INPATIENT. Inpatient status is judged to be reasonable and necessary in order to provide the required intensity of service to ensure the patient's safety. The patient's presenting symptoms, physical exam findings, and initial radiographic and laboratory data in the context of their chronic comorbidities is felt to place them at high risk for further clinical deterioration. Furthermore, it is not anticipated that the patient will be medically stable for discharge from the hospital within 2 midnights of admission.   * I certify that at the point of admission it is my clinical judgment that the patient will require inpatient hospital care spanning beyond 2 midnights from the point of admission due to high intensity of service, high risk for further deterioration and high frequency of surveillance required.*  AuthorLonia Blood, MD 07/22/2023 9:49 PM  For on call review [email or web address].

## 2023-07-23 DIAGNOSIS — A419 Sepsis, unspecified organism: Secondary | ICD-10-CM | POA: Diagnosis not present

## 2023-07-23 DIAGNOSIS — J189 Pneumonia, unspecified organism: Secondary | ICD-10-CM | POA: Diagnosis not present

## 2023-07-23 LAB — PROTIME-INR
INR: 1.2 (ref 0.8–1.2)
Prothrombin Time: 15.6 s — ABNORMAL HIGH (ref 11.4–15.2)

## 2023-07-23 LAB — COMPREHENSIVE METABOLIC PANEL
ALT: 15 U/L (ref 0–44)
AST: 32 U/L (ref 15–41)
Albumin: 2.1 g/dL — ABNORMAL LOW (ref 3.5–5.0)
Alkaline Phosphatase: 92 U/L (ref 38–126)
Anion gap: 11 (ref 5–15)
BUN: 31 mg/dL — ABNORMAL HIGH (ref 8–23)
CO2: 23 mmol/L (ref 22–32)
Calcium: 8.9 mg/dL (ref 8.9–10.3)
Chloride: 113 mmol/L — ABNORMAL HIGH (ref 98–111)
Creatinine, Ser: 1.52 mg/dL — ABNORMAL HIGH (ref 0.44–1.00)
GFR, Estimated: 31 mL/min — ABNORMAL LOW (ref >60)
Glucose, Bld: 88 mg/dL (ref 70–99)
Potassium: 4.3 mmol/L (ref 3.5–5.1)
Sodium: 147 mmol/L — ABNORMAL HIGH (ref 135–145)
Total Bilirubin: 0.9 mg/dL (ref 0.0–1.2)
Total Protein: 5.2 g/dL — ABNORMAL LOW (ref 6.5–8.1)

## 2023-07-23 LAB — CBC
HCT: 37.5 % (ref 36.0–46.0)
Hemoglobin: 12 g/dL (ref 12.0–15.0)
MCH: 31.3 pg (ref 26.0–34.0)
MCHC: 32 g/dL (ref 30.0–36.0)
MCV: 97.9 fL (ref 80.0–100.0)
Platelets: 248 10*3/uL (ref 150–400)
RBC: 3.83 MIL/uL — ABNORMAL LOW (ref 3.87–5.11)
RDW: 18.3 % — ABNORMAL HIGH (ref 11.5–15.5)
WBC: 11.3 10*3/uL — ABNORMAL HIGH (ref 4.0–10.5)
nRBC: 0 % (ref 0.0–0.2)

## 2023-07-23 LAB — CORTISOL-AM, BLOOD: Cortisol - AM: 23.5 ug/dL — ABNORMAL HIGH (ref 6.7–22.6)

## 2023-07-23 LAB — CREATININE, SERUM
Creatinine, Ser: 1.52 mg/dL — ABNORMAL HIGH (ref 0.44–1.00)
GFR, Estimated: 31 mL/min — ABNORMAL LOW (ref >60)

## 2023-07-23 MED ORDER — MORPHINE SULFATE (PF) 2 MG/ML IV SOLN
1.0000 mg | INTRAVENOUS | Status: DC | PRN
  Administered 2023-07-23 – 2023-07-25 (×4): 1 mg via INTRAVENOUS
  Filled 2023-07-23 (×4): qty 1

## 2023-07-23 MED ORDER — SODIUM CHLORIDE 0.9 % IV SOLN
2.0000 g | INTRAVENOUS | Status: DC
  Administered 2023-07-23 – 2023-07-25 (×3): 2 g via INTRAVENOUS
  Filled 2023-07-23 (×3): qty 20

## 2023-07-23 MED ORDER — SODIUM CHLORIDE 0.9 % IV SOLN: 2.0000 g | INTRAVENOUS | Status: DC

## 2023-07-23 MED ORDER — LACTATED RINGERS IV SOLN: INTRAVENOUS | Status: AC

## 2023-07-23 NOTE — Plan of Care (Signed)

## 2023-07-23 NOTE — Progress Notes (Signed)
 PROGRESS NOTE ALETTE KATAOKA  WUJ:[id number] DOB: 21-Apr-1925 DOA: 07/22/2023 PCP: Pcp, No  Brief Narrative/Hospital Course: 89 year old female with advanced dementia, hypertension hyperlipidemia S/P PPM, arthritis/osteoporosis currently on hospice care at [facility] brought to the ED with altered mental status fever and right hip pain x 3 days.  EMS was called and found to be slightly hypotensive  80/50 and tachycardic and tachypneic and warm given 1 L normal saline with some improvement and placed into left nasal cannula saturating 93% brought to the ED, was ill-appearing, tachycardic some tenderness in the right hip right knee and has ulceration on the right lower leg awake with eyes open but not answering questions.  Found to be tachycardic hypoxic chest x-ray no pulmonary edema has right lower lobe infiltrate, COVID flu test negative imaging  w/ x-ray hip and CT pelvis did not show any fracture on bilateral hip, showed colonic diverticulosis with diffusely decreased bone density . CT of the head no intracranial acute finding, patient was admitted for sepsis and community-acquired pneumonia to see if she improves if not then transition to comfort measures.     Subjective: Seen and examined this morning she is alert oriented to self, not in distress Complains of being cold Overnight afebrile vitals stable on 2L [place].  Labs with hyperglycemia 147 creatinine elevated 1.4> 1.5 bun 32, LFTs albumin 2.1.  Mild leukocytosis 11.3.  Serum cortisol 23.5  Assessment and Plan: Principal Problem:   Sepsis due to pneumonia [facility]) Active Problems:   Hyperlipidemia   Essential hypertension   PACEMAKER, PERMANENT   Aspiration pneumonia (HCC)   Dementia (HCC)   Hypernatremia   Severe sepsis due to pneumonia Acute hypoxic aspiratory failure due to pneumonia: Vitals remained stable at times BP soft.  Continue with empiric antibiotics-to see if she improves with DNR comfort measures. Cont ceftriaxone   azithromycin. Blood culture pending. Recent Labs  Lab 07/22/23 1717 07/22/23 1730 07/22/23 2251 07/23/23 0209  WBC 11.6*  --   --  11.3*  LATICACIDVEN  --  2.2* 1.1  --     DNR comfort Currently hospice patient: Family wants to see how she does with antibiotics if she does not improve then transitioned to comfort measures. Consult palliative care-but was informed hospice team will follow today.  Acute metabolic encephalopathy Advanced dementia: Currently enrolled in hospice at the facility.  Continue supportive care, delirium precaution.  Hypernatremia: Continue IV fluids and monitor labs.  CKD 3B: Creatinine currently 1.4-1.5 continue IV fluid hydration  Hypertension Hyperlipidemia: Holding meds.  Right hip pain: CT pelvis no acute finding.  Continue pain control. ?  If any benefits of repeating x-ray.  DVT prophylaxis: heparin injection 5,000 Units Start: 07/22/23 2345 Code Status:   Code Status: Do not attempt resuscitation (DNR) - Comfort care Family Communication: plan of care discussed with patient/called family on phone- Niece Letta Pate.  Patient status is: Remains hospitalized because of severity of illness. Level of care: Telemetry Medical   Dispo: The patient is from: memory care w/ hospice            Anticipated disposition:TBD awaiting Hospice Team to meet with family. Objective: Vitals last 24 hrs: Vitals:   07/23/23 0059 07/23/23 0100 07/23/23 0550 07/23/23 0843  BP: 99/65  133/64 (!) 100/55  Pulse: 80  81 60  Resp: 20  18 17   Temp: 97.7 F (36.5 C)  97.7 F (36.5 C) 97.7 F (36.5 C)  TempSrc: Axillary  Axillary Oral  SpO2: 92%  92% 96%  Weight:  63.7 kg     Weight change:   Physical Examination: General exam: alert awake, oriented to self frail, older than stated age HEENT:Oral mucosa moist, Ear/Nose WNL grossly Respiratory system: Bilaterally diminished,no use of accessory muscle Cardiovascular system: S1 & S2 +, No JVD. Gastrointestinal  system: Abdomen soft,NT,ND, BS+ Nervous System: Alert, awake, moving all extremities,and following commands. Extremities: LE edema neg,distal peripheral pulses palpable and warm.  Skin: No rashes,no icterus. YNW:GNFA muscle bulk,tone, power.   Medications reviewed:  Scheduled Meds:  heparin  5,000 Units Subcutaneous Q8H  Continuous Infusions:  azithromycin 500 mg (07/23/23 0817)   lactated ringers 75 mL/hr (07/23/23 0121)    Diet Order             DIET DYS 3 Room service appropriate? Yes; Fluid consistency: Thin  Diet effective now                   Intake/Output Summary (Last 24 hours) at 07/23/2023 1018 Last data filed at 07/22/2023 2218 Gross per 24 hour  Intake 396.61 ml  Output --  Net 396.61 ml   Net IO Since Admission: 396.61 mL [07/23/23 1018]  Wt Readings from Last 3 Encounters:  07/23/23 63.7 kg  09/19/20 44.7 kg  09/05/20 42.5 kg    Unresulted Labs (From admission, onward)     Start     Ordered   07/23/23 0233  MRSA Next Gen by PCR, Nasal  Once,   R        07/23/23 0233   07/22/23 1651  Urinalysis, w/ Reflex to Culture (Infection Suspected) -Urine, Unspecified Source  (Undifferentiated presentation (screening labs and basic nursing orders))  ONCE - URGENT,   URGENT       Question:  Specimen Source  Answer:  Urine, Unspecified Source   07/22/23 1651          Data Reviewed: I have personally reviewed following labs and imaging studies CBC: Recent Labs  Lab 07/22/23 1717 07/23/23 0209  WBC 11.6* 11.3*  NEUTROABS 9.3*  --   HGB 11.9* 12.0  HCT 36.2 37.5  MCV 96.3 97.9  PLT 335 248   Basic Metabolic Panel:  Recent Labs  Lab 07/22/23 1848 07/23/23 0209  NA 146* 147*  K 4.2 4.3  CL 114* 113*  CO2 23 23  GLUCOSE 94 88  BUN 32* 31*  CREATININE 1.47* 1.52*  1.52*  CALCIUM 8.7* 8.9   GFR: CrCl cannot be calculated (Unknown ideal weight.). Liver Function Tests:  Recent Labs  Lab 07/22/23 1848 07/23/23 0209  AST 31 32  ALT 15 15   ALKPHOS 90 92  BILITOT 0.7 0.9  PROT 5.7* 5.2*  ALBUMIN 2.1* 2.1*   No results for input(s): "LIPASE", "AMYLASE" in the last 168 hours. No results for input(s): "AMMONIA" in the last 168 hours. Coagulation Profile:  Recent Labs  Lab 07/22/23 1717 07/23/23 0209  INR 1.2 1.2   Sepsis Labs: Recent Labs  Lab 07/22/23 1730 07/22/23 2251  LATICACIDVEN 2.2* 1.1   Recent Results (from the past 240 hours)  Resp panel by RT-PCR (RSV, Flu A&B, Covid) Anterior Nasal Swab     Status: None   Collection Time: 07/22/23  5:03 PM   Specimen: Anterior Nasal Swab  Result Value Ref Range Status   SARS Coronavirus 2 by RT PCR NEGATIVE NEGATIVE Final   Influenza A by PCR NEGATIVE NEGATIVE Final   Influenza B by PCR NEGATIVE NEGATIVE Final    Comment: (NOTE) The Xpert  Xpress SARS-CoV-2/FLU/RSV plus assay is intended as an aid in the diagnosis of influenza from Nasopharyngeal swab specimens and should not be used as a sole basis for treatment. Nasal washings and aspirates are unacceptable for Xpert Xpress SARS-CoV-2/FLU/RSV testing.  Fact Sheet for Patients: BloggerCourse.com  Fact Sheet for Healthcare Providers: SeriousBroker.it  This test is not yet approved or cleared by the [place] FDA and has been authorized for detection and/or diagnosis of SARS-CoV-2 by FDA under an Emergency Use Authorization (EUA). This EUA will remain in effect (meaning this test can be used) for the duration of the COVID-19 declaration under Section 564(b)(1) of the Act, 21 U.S.C. section 360bbb-3(b)(1), unless the authorization is terminated or revoked.     Resp Syncytial Virus by PCR NEGATIVE NEGATIVE Final    Comment: (NOTE) Fact Sheet for Patients: BloggerCourse.com  Fact Sheet for Healthcare Providers: SeriousBroker.it  This test is not yet approved or cleared by the [place] FDA and has  been authorized for detection and/or diagnosis of SARS-CoV-2 by FDA under an Emergency Use Authorization (EUA). This EUA will remain in effect (meaning this test can be used) for the duration of the COVID-19 declaration under Section 564(b)(1) of the Act, 21 U.S.C. section 360bbb-3(b)(1), unless the authorization is terminated or revoked.  Performed at [facility], [address], [place], [place] [address]   Blood Culture (routine x 2)     Status: None (Preliminary result)   Collection Time: 07/22/23  5:28 PM   Specimen: BLOOD  Result Value Ref Range Status   Specimen Description BLOOD SITE NOT SPECIFIED  Final   Special Requests   Final    BOTTLES DRAWN AEROBIC AND ANAEROBIC Blood Culture results may not be optimal due to an inadequate volume of blood received in culture bottles   Culture   Final    NO GROWTH < 24 HOURS Performed at [facility], [address] [place], [place] [address]    Report Status PENDING  Incomplete  Blood Culture (routine x 2)     Status: None (Preliminary result)   Collection Time: 07/22/23  5:37 PM   Specimen: BLOOD  Result Value Ref Range Status   Specimen Description BLOOD SITE NOT SPECIFIED  Final   Special Requests   Final    BOTTLES DRAWN AEROBIC AND ANAEROBIC Blood Culture adequate volume   Culture   Final    NO GROWTH < 24 HOURS Performed at [facility], [address], [place], [place] [address]    Report Status PENDING  Incomplete    Antimicrobials/Microbiology: Anti-infectives (From admission, onward)    Start     Dose/Rate Route Frequency Ordered Stop   07/23/23 0800  azithromycin (ZITHROMAX) 500 mg in sodium chloride 0.9 % 250 mL IVPB        500 mg 250 mL/hr over 60 Minutes Intravenous Every 24 hours 07/22/23 2338 07/28/23 0759   07/22/23 1845  ceFEPIme (MAXIPIME) 2 g in sodium chloride 0.9 % 100 mL IVPB        2 g 200 mL/hr over 30 Minutes Intravenous  Once 07/22/23 1842 07/22/23 1921   07/22/23 1800   aztreonam (AZACTAM) 2 g in sodium chloride 0.9 % 100 mL IVPB  Status:  Discontinued        2 g 200 mL/hr over 30 Minutes Intravenous  Once 07/22/23 1757 07/22/23 1842   07/22/23 1800  metroNIDAZOLE (FLAGYL) IVPB 500 mg        500 mg  100 mL/hr over 60 Minutes Intravenous  Once 07/22/23 1757 07/22/23 2028   07/22/23 1800  vancomycin (VANCOCIN) IVPB 1000 mg/200 mL premix        1,000 mg 200 mL/hr over 60 Minutes Intravenous  Once 07/22/23 1757 07/22/23 2203         Component Value Date/Time   SDES BLOOD SITE NOT SPECIFIED 07/22/2023 1737   SPECREQUEST  07/22/2023 1737    BOTTLES DRAWN AEROBIC AND ANAEROBIC Blood Culture adequate volume   CULT  07/22/2023 1737    NO GROWTH < 24 HOURS Performed at [facility], [address], [place], [place] [address]    REPTSTATUS PENDING 07/22/2023 1737     Radiology Studies: CT Head Wo Contrast Result Date: 07/22/2023 CLINICAL DATA:  Mental status change, unknown cause EXAM: CT HEAD WITHOUT CONTRAST TECHNIQUE: Contiguous axial images were obtained from the base of the skull through the vertex without intravenous contrast. RADIATION DOSE REDUCTION: This exam was performed according to the departmental dose-optimization program which includes automated exposure control, adjustment of the mA and/or kV according to patient size and/or use of iterative reconstruction technique. COMPARISON:  CT head 09/26/2020 FINDINGS: Brain: Cerebral ventricle sizes are concordant with the degree of cerebral volume loss. Patchy and confluent areas of decreased attenuation are noted throughout the deep and periventricular white matter of the cerebral hemispheres bilaterally, compatible with chronic microvascular ischemic disease. No evidence of large-territorial acute infarction. No parenchymal hemorrhage. No mass lesion. No extra-axial collection. No mass effect or midline shift. No hydrocephalus. Basilar cisterns are patent. Vascular: No hyperdense vessel. Skull: No  acute fracture or focal lesion. Sinuses/Orbits: Paranasal sinuses and mastoid air cells are clear. Bilateral lens replacement. Otherwise the orbits are unremarkable. Other: None. IMPRESSION: No acute intracranial abnormality. Electronically Signed   By: Tish Frederickson M.D.   On: 07/22/2023 20:24   CT PELVIS WO CONTRAST Result Date: 07/22/2023 CLINICAL DATA:  Hip trauma, fracture suspected, xray done Pt very altered and unable to hold still or properly position for CT scan. Rescanned head due to motion artifact. Best images obtained. EXAM: CT PELVIS WITHOUT CONTRAST TECHNIQUE: Multidetector CT imaging of the pelvis was performed following the standard protocol without intravenous contrast. RADIATION DOSE REDUCTION: This exam was performed according to the departmental dose-optimization program which includes automated exposure control, adjustment of the mA and/or kV according to patient size and/or use of iterative reconstruction technique. COMPARISON:  CT abdomen pelvis 05/04/2016 FINDINGS: Urinary Tract: Pericentimeter right nephrolithiasis. No abnormality visualized. Bowel: Colonic diverticulosis. Limited evaluation due the anal and rectal region due to under distension and noncontrast study. Vascular/Lymphatic: Severe atherosclerotic plaque. No abdominal aorta or iliac aneurysm. Mild atherosclerotic plaque of the aorta and its branches. No abdominal, pelvic, or inguinal lymphadenopathy. Reproductive:  No mass or other significant abnormality Other: Pelvic floor laxity. No intraperitoneal free fluid. No intraperitoneal free gas. No organized fluid collection. Musculoskeletal: Diffusely decreased bone density. Bilateral hip demonstrates no acute displaced fracture or dislocation. No acute displaced fracture or diastasis of the bones of the pelvis. Old healed bilateral inferior and superior pubic rami fractures. No acute displaced fracture of the sacral bones. IMPRESSION: 1. Diffusely decreased bone density. 2.   Negative for acute traumatic injury. 3. Pericentimeter right nephrolithiasis. 4.  Aortic Atherosclerosis (ICD10-I70.0). Electronically Signed   By: Tish Frederickson M.D.   On: 07/22/2023 20:22   DG Knee 1-2 Views Right Result Date: 07/22/2023 CLINICAL DATA:  [address] Leg pain [address] EXAM: RIGHT KNEE - 1-2 VIEW COMPARISON:  X-ray left femur 03/28/2019 FINDINGS: No evidence of fracture, dislocation, or joint effusion. Cortical irregularity of the fibular neck suggestive of an old healed fracture. Tricompartmental joint narrowing. Soft tissues are unremarkable. Vascular calcifications. IMPRESSION: No acute displaced fracture or dislocation. Electronically Signed   By: Tish Frederickson M.D.   On: 07/22/2023 19:32   DG Hip Unilat W or Wo Pelvis 2-3 Views Right Result Date: 07/22/2023 CLINICAL DATA:  leg pain.  Fall EXAM: DG HIP (WITH OR WITHOUT PELVIS) 2-3V RIGHT; DG HIP (WITH OR WITHOUT PELVIS) 2-3V LEFT COMPARISON:  X-ray right hip 03/28/2019, x-ray left femur 03/28/2019 FINDINGS: Limited evaluation due to overlapping osseous structures and overlying soft tissues. No acute displaced fracture or diastasis of the bones of the pelvis. No acute displaced fracture or dislocation of the left hip. Abnormal projection of the right hip with query underlying intratrochanteric or femoral neck fracture versus artifact due to positioning. There is no evidence of severe arthropathy or other focal bone abnormality. Vascular calcifications. IMPRESSION: 1. Negative for definite acute traumatic injury. 2. Abnormal projection of the right hip with query underlying intratrochanteric or femoral neck fracture versus artifact due to positioning. Recommend repeat three-view right hip radiograph. Electronically Signed   By: Tish Frederickson M.D.   On: 07/22/2023 19:30   DG Hip Unilat W or Wo Pelvis 2-3 Views Left Result Date: 07/22/2023 CLINICAL DATA:  leg pain.  Fall EXAM: DG HIP (WITH OR WITHOUT PELVIS) 2-3V RIGHT; DG HIP (WITH OR WITHOUT  PELVIS) 2-3V LEFT COMPARISON:  X-ray right hip 03/28/2019, x-ray left femur 03/28/2019 FINDINGS: Limited evaluation due to overlapping osseous structures and overlying soft tissues. No acute displaced fracture or diastasis of the bones of the pelvis. No acute displaced fracture or dislocation of the left hip. Abnormal projection of the right hip with query underlying intratrochanteric or femoral neck fracture versus artifact due to positioning. There is no evidence of severe arthropathy or other focal bone abnormality. Vascular calcifications. IMPRESSION: 1. Negative for definite acute traumatic injury. 2. Abnormal projection of the right hip with query underlying intratrochanteric or femoral neck fracture versus artifact due to positioning. Recommend repeat three-view right hip radiograph. Electronically Signed   By: Tish Frederickson M.D.   On: 07/22/2023 19:30   DG Chest 1 View Result Date: 07/22/2023 CLINICAL DATA:  [address] Leg pain [address].  Fall EXAM: CHEST  1 VIEW COMPARISON:  Chest x-ray 09/26/2020 FINDINGS: Minimal overlies the left upper lobe. The heart and mediastinal contours are within normal limits. Left chest wall dual lead pacemaker. Interval development of right lower lung zone airspace opacity. Chronic coarsened interstitial markings with no overt pulmonary edema. No pleural effusion. No pneumothorax. No acute osseous abnormality.  Sternotomy wires are intact. IMPRESSION: 1. Right lower lung zone pneumonia. Followup PA and lateral chest X-ray is recommended in 3-4 weeks following therapy to ensure resolution and exclude underlying malignancy. 2. Minimal overlies the left upper lobe-left upper lobe only partially visualized/evaluated. 3.  Aortic Atherosclerosis (ICD10-I70.0). Electronically Signed   By: Tish Frederickson M.D.   On: 07/22/2023 19:26     LOS: 1 day   Total time spent in review of labs and imaging, patient evaluation, formulation of plan, documentation and communication with family: 35  minutes  Lanae Boast, MD  [facility]  07/23/2023, 10:18 AM

## 2023-07-23 NOTE — ED Notes (Signed)
 Liborio Nixon, [place], notified of patient room placement.

## 2023-07-23 NOTE — Hospital Course (Addendum)
 89 year old female with advanced dementia, hypertension hyperlipidemia S/P PPM, arthritis/osteoporosis currently on hospice care at [facility] brought to the ED with altered mental status fever and right hip pain x 3 days.  EMS was called and found to be slightly hypotensive  80/50 and tachycardic and tachypneic and warm given 1 L normal saline with some improvement and placed into left nasal cannula saturating 93% brought to the ED, was ill-appearing, tachycardic some tenderness in the right hip right knee and has ulceration on the right lower leg awake with eyes open but not answering questions.  Found to be tachycardic hypoxic chest x-ray no pulmonary edema has right lower lobe infiltrate, COVID flu test negative imaging  w/ x-ray hip and CT pelvis did not show any fracture on bilateral hip, showed colonic diverticulosis with diffusely decreased bone density . CT of the head no intracranial acute finding, patient was admitted for sepsis and community-acquired pneumonia to see if she improves if not then transition to comfort measures.

## 2023-07-23 NOTE — Progress Notes (Signed)
 MC 6N31 [facility] Patient Visit   [name] is a current hospice patient, with hospice diagnosis of Alzheimer's Dementia. She was admitted 07/22/23 with diagnosis of severe sepsis due to pneumonia . [facility] was notified of patient was experiencing right hip pain and swelling, unable to obtain x-ray in facility due to increased pain. Family requested that patient be evaluated in the ED. Per Dr. Patric Dykes, hospice physician, this is a related hospital admission.    Visited patient at bedside. Patient resting with eyes closed. Appeared to be in no acute distress. IV antibiotics being administered. Family at bedside. Had in depth conversation with family about what comfort care means. Family struggling with decision to continue IV antibiotics to see if patient will have any improvement vs discontinuing and starting comfort care. Family had questions regarding [employer]. Provided information as well as written information. Family very appreciative. Requested to speak with Palliative to get more clarification on if IV antibiotics will improve quality of life.   Patient is GIP appropriate for evaluation and management of sepsis requiring IV antibiotics.    Vital Signs: 97.7/60/17  100/55   O2 96% on 2LPM   I&O: 396.6/none documented   Abnormal labs:  07/22/23 17:17 WBC: 11.6 (H) RBC: 3.76 (L) Hemoglobin: 11.9 (L) RDW: 18.6 (H) nRBC: 0.3 (H) Abs Immature Granulocytes: 0.10 (H) Prothrombin Time: 15.5 (H)  07/22/23 17:30 Lactic Acid, Venous: 2.2 (HH)  07/22/23 18:48 COMPREHENSIVE METABOLIC PANEL: Rpt ! Sodium: 146 (H) Chloride: 114 (H) BUN: 32 (H) Creatinine: 1.47 (H) Calcium: 8.7 (L) Albumin: 2.1 (L) Total Protein: 5.7 (L) GFR, Estimated: 32 (L)  07/23/23 02:09 COMPREHENSIVE METABOLIC PANEL: Rpt ! Sodium: 147 (H) Chloride: 113 (H) BUN: 31 (H) Creatinine: 1.52 (H)    1.52 (H) Albumin: 2.1 (L) Total Protein: 5.2 (L) GFR,  Estimated: 31 (L)    31 (L) WBC: 11.3 (H) RBC: 3.83 (L) RDW: 18.3 (H) Prothrombin Time: 15.6 (H) Cortisol - AM: 23.5 (H)  Diagnostics:  Narrative & Impression CLINICAL DATA:  78469 Leg pain 62952.  Fall   EXAM: CHEST  1 VIEW   COMPARISON:  Chest x-ray 09/26/2020   FINDINGS: Minimal overlies the left upper lobe.   The heart and mediastinal contours are within normal limits. Left chest wall dual lead pacemaker.   Interval development of right lower lung zone airspace opacity. Chronic coarsened interstitial markings with no overt pulmonary edema. No pleural effusion. No pneumothorax.   No acute osseous abnormality.  Sternotomy wires are intact.   IMPRESSION: 1. Right lower lung zone pneumonia. Followup PA and lateral chest X-ray is recommended in 3-4 weeks following therapy to ensure resolution and exclude underlying malignancy. 2. Minimal overlies the left upper lobe-left upper lobe only partially visualized/evaluated. 3.  Aortic Atherosclerosis (ICD10-I70.0).     Electronically Signed   By: Tish Frederickson M.D.   On: 07/22/2023 19:26  Narrative & Impression CLINICAL DATA:  leg pain.  Fall   EXAM: DG HIP (WITH OR WITHOUT PELVIS) 2-3V RIGHT; DG HIP (WITH OR WITHOUT PELVIS) 2-3V LEFT   COMPARISON:  X-ray right hip 03/28/2019, x-ray left femur 03/28/2019   FINDINGS: Limited evaluation due to overlapping osseous structures and overlying soft tissues. No acute displaced fracture or diastasis of the bones of the pelvis. No acute displaced fracture or dislocation of the left hip. Abnormal projection of the right hip with query underlying intratrochanteric or femoral neck fracture versus artifact due to positioning. There is no evidence of severe arthropathy or  other focal bone abnormality.   Vascular calcifications.   IMPRESSION: 1. Negative for definite acute traumatic injury. 2. Abnormal projection of the right hip with query underlying intratrochanteric or  femoral neck fracture versus artifact due to positioning. Recommend repeat three-view right hip radiograph.     Electronically Signed   By: Tish Frederickson M.D.   On: 07/22/2023 19:30  Narrative & Impression CLINICAL DATA:  99497 Leg pain 16109   EXAM: RIGHT KNEE - 1-2 VIEW   COMPARISON:  X-ray left femur 03/28/2019   FINDINGS: No evidence of fracture, dislocation, or joint effusion. Cortical irregularity of the fibular neck suggestive of an old healed fracture. Tricompartmental joint narrowing. Soft tissues are unremarkable. Vascular calcifications.   IMPRESSION: No acute displaced fracture or dislocation.     Electronically Signed   By: Tish Frederickson M.D.   On: 07/22/2023 19:32  Narrative & Impression CLINICAL DATA:  leg pain.  Fall   EXAM: DG HIP (WITH OR WITHOUT PELVIS) 2-3V RIGHT; DG HIP (WITH OR WITHOUT PELVIS) 2-3V LEFT   COMPARISON:  X-ray right hip 03/28/2019, x-ray left femur 03/28/2019   FINDINGS: Limited evaluation due to overlapping osseous structures and overlying soft tissues. No acute displaced fracture or diastasis of the bones of the pelvis. No acute displaced fracture or dislocation of the left hip. Abnormal projection of the right hip with query underlying intratrochanteric or femoral neck fracture versus artifact due to positioning. There is no evidence of severe arthropathy or other focal bone abnormality.   Vascular calcifications.   IMPRESSION: 1. Negative for definite acute traumatic injury. 2. Abnormal projection of the right hip with query underlying intratrochanteric or femoral neck fracture versus artifact due to positioning. Recommend repeat three-view right hip radiograph.     Electronically Signed   By: Tish Frederickson M.D.   On: 07/22/2023 19:30  Narrative & Impression CLINICAL DATA:  Mental status change, unknown cause   EXAM: CT HEAD WITHOUT CONTRAST   TECHNIQUE: Contiguous axial images were obtained from the  base of the skull through the vertex without intravenous contrast.   RADIATION DOSE REDUCTION: This exam was performed according to the departmental dose-optimization program which includes automated exposure control, adjustment of the mA and/or kV according to patient size and/or use of iterative reconstruction technique.   COMPARISON:  CT head 09/26/2020   FINDINGS: Brain:   Cerebral ventricle sizes are concordant with the degree of cerebral volume loss. Patchy and confluent areas of decreased attenuation are noted throughout the deep and periventricular white matter of the cerebral hemispheres bilaterally, compatible with chronic microvascular ischemic disease. No evidence of large-territorial acute infarction. No parenchymal hemorrhage. No mass lesion. No extra-axial collection.   No mass effect or midline shift. No hydrocephalus. Basilar cisterns are patent.   Vascular: No hyperdense vessel.   Skull: No acute fracture or focal lesion.   Sinuses/Orbits: Paranasal sinuses and mastoid air cells are clear. Bilateral lens replacement. Otherwise the orbits are unremarkable.   Other: None.   IMPRESSION: No acute intracranial abnormality.     Electronically Signed   By: Tish Frederickson M.D.   On: 07/22/2023 20:24  Narrative & Impression CLINICAL DATA:  Hip trauma, fracture suspected, xray done Pt very altered and unable to hold still or properly position for CT scan. Rescanned head due to motion artifact. Best images obtained.   EXAM: CT PELVIS WITHOUT CONTRAST   TECHNIQUE: Multidetector CT imaging of the pelvis was performed following the standard protocol without intravenous contrast.   RADIATION  DOSE REDUCTION: This exam was performed according to the departmental dose-optimization program which includes automated exposure control, adjustment of the mA and/or kV according to patient size and/or use of iterative reconstruction technique.   COMPARISON:  CT  abdomen pelvis 05/04/2016   FINDINGS: Urinary Tract: Pericentimeter right nephrolithiasis. No abnormality visualized.   Bowel: Colonic diverticulosis. Limited evaluation due the anal and rectal region due to under distension and noncontrast study.   Vascular/Lymphatic: Severe atherosclerotic plaque. No abdominal aorta or iliac aneurysm. Mild atherosclerotic plaque of the aorta and its branches. No abdominal, pelvic, or inguinal lymphadenopathy.   Reproductive:  No mass or other significant abnormality   Other: Pelvic floor laxity. No intraperitoneal free fluid. No intraperitoneal free gas. No organized fluid collection.   Musculoskeletal: Diffusely decreased bone density. Bilateral hip demonstrates no acute displaced fracture or dislocation. No acute displaced fracture or diastasis of the bones of the pelvis. Old healed bilateral inferior and superior pubic rami fractures. No acute displaced fracture of the sacral bones.   IMPRESSION: 1. Diffusely decreased bone density. 2.  Negative for acute traumatic injury. 3. Pericentimeter right nephrolithiasis. 4.  Aortic Atherosclerosis (ICD10-I70.0).     Electronically Signed   By: Tish Frederickson M.D.   On: 07/22/2023 20:22    IV/PRN Meds: azithromycin (ZITHROMAX) 500 mg in sodium chloride 0.9 % 250 mL IVPB X1,  ceFEPIme (MAXIPIME) 2 g in sodium chloride 0.9 % 100 mL IVPB X1, lactated ringers infusion 75cc/hr, metroNIDAZOLE (FLAGYL) IVPB 500 mg x1, vancomycin (VANCOCIN) IVPB 1000 mg/200 mL premix x1,     Assessment and Plan: Per Progress Note Lanae Boast, MD 3.15.25 Principal Problem:   Sepsis due to pneumonia [facility]) Active Problems:   Hyperlipidemia   Essential hypertension   PACEMAKER, PERMANENT   Aspiration pneumonia (HCC)   Dementia (HCC)   Hypernatremia   Severe sepsis due to pneumonia Acute hypoxic aspiratory failure due to pneumonia: Vitals remained stable at times BP soft.  Continue with empiric antibiotics-to see if  she improves with DNR comfort measures. Cont ceftriaxone  azithromycin. Blood culture pending. Last Labs        Recent Labs  Lab 07/22/23 1717 07/22/23 1730 07/22/23 2251 07/23/23 0209  WBC 11.6*  --   --  11.3*  LATICACIDVEN  --  2.2* 1.1  --       DNR comfort Currently hospice patient: Family wants to see how she does with antibiotics if she does not improve then transitioned to comfort measures. Consult palliative care-but was informed hospice team will follow today.   Acute metabolic encephalopathy Advanced dementia: Currently enrolled in hospice at the facility.  Continue supportive care, delirium precaution.   Hypernatremia: Continue IV fluids and monitor labs.   CKD 3B: Creatinine currently 1.4-1.5 continue IV fluid hydration   Hypertension Hyperlipidemia: Holding meds.   Right hip pain: CT pelvis no acute finding.  Continue pain control. ?  If any benefits of repeating x-ray.     Discharge Planning: Ongoing   Family Contact:  spoke family at bedside. Had in depth conversation with family about what comfort care means. Discussed what [facility] is and process for admission if patient becomes appropriate.  IDT: Updated   Goals of Care: DNR   Roe Rutherford BSN, [facility] Liaison [phone]

## 2023-07-24 ENCOUNTER — Encounter (HOSPITAL_COMMUNITY): Payer: Self-pay | Admitting: Internal Medicine

## 2023-07-24 ENCOUNTER — Other Ambulatory Visit: Payer: Self-pay

## 2023-07-24 DIAGNOSIS — Z515 Encounter for palliative care: Secondary | ICD-10-CM

## 2023-07-24 DIAGNOSIS — J189 Pneumonia, unspecified organism: Secondary | ICD-10-CM | POA: Diagnosis not present

## 2023-07-24 DIAGNOSIS — I10 Essential (primary) hypertension: Secondary | ICD-10-CM

## 2023-07-24 DIAGNOSIS — A419 Sepsis, unspecified organism: Secondary | ICD-10-CM | POA: Diagnosis not present

## 2023-07-24 DIAGNOSIS — F03C0 Unspecified dementia, severe, without behavioral disturbance, psychotic disturbance, mood disturbance, and anxiety: Secondary | ICD-10-CM

## 2023-07-24 DIAGNOSIS — E870 Hyperosmolality and hypernatremia: Secondary | ICD-10-CM

## 2023-07-24 DIAGNOSIS — Z950 Presence of cardiac pacemaker: Secondary | ICD-10-CM

## 2023-07-24 DIAGNOSIS — E782 Mixed hyperlipidemia: Secondary | ICD-10-CM

## 2023-07-24 NOTE — Plan of Care (Signed)
  Problem: Fluid Volume: Goal: Hemodynamic stability will improve Outcome: Progressing   Problem: Education: Goal: Knowledge of General Education information will improve Description: Including pain rating scale, medication(s)/side effects and non-pharmacologic comfort measures Outcome: Progressing   Problem: Health Behavior/Discharge Planning: Goal: Ability to manage health-related needs will improve Outcome: Progressing   Problem: Clinical Measurements: Goal: Ability to maintain clinical measurements within normal limits will improve Outcome: Progressing   Problem: Coping: Goal: Level of anxiety will decrease Outcome: Progressing

## 2023-07-24 NOTE — Progress Notes (Signed)
 Progress Note   Patient: Alexis Cobb ZOX:[id number] DOB: November 26, 1924 DOA: 07/22/2023     2 DOS: the patient was seen and examined on 07/24/2023   Brief hospital course: 89 year old female with advanced dementia, hypertension hyperlipidemia S/P PPM, arthritis/osteoporosis currently on hospice care at [facility] brought to the ED with altered mental status fever and right hip pain x 3 days.  EMS was called and found to be slightly hypotensive  80/50 and tachycardic and tachypneic and warm given 1 L normal saline with some improvement and placed into left nasal cannula saturating 93% brought to the ED, was ill-appearing, tachycardic some tenderness in the right hip right knee and has ulceration on the right lower leg awake with eyes open but not answering questions.  Found to be tachycardic hypoxic chest x-ray no pulmonary edema has right lower lobe infiltrate, COVID flu test negative imaging  w/ x-ray hip and CT pelvis did not show any fracture on bilateral hip, showed colonic diverticulosis with diffusely decreased bone density . CT of the head no intracranial acute finding, patient was admitted for sepsis and community-acquired pneumonia to see if she improves if not then transition to comfort measures.  Assessment and Plan: Severe sepsis due to pneumonia Acute hypoxic aspiratory failure due to pneumonia: BP better. Continue with empiric antibiotics for another 2 days. DNR comfort measures. Cont ceftriaxone  azithromycin. Blood culture negative. Discussed with family regarding plan to continues antibiotics.  DNR comfort Currently hospice patient: Acute metabolic encephalopathy Advanced dementia: Currently enrolled in hospice at the facility.  Continue supportive care, delirium precaution.   Hypernatremia: Continue IV fluids and monitor labs. Sodium stable.   CKD 3B: Creatinine currently 1.4-1.5 continue IV fluid hydration   Hypertension Hyperlipidemia: Hold meds for now. BP  soft.   Right hip pain: CT pelvis no acute finding.  Continue pain control.     Out of bed to chair. Incentive spirometry. Nursing supportive care. Fall, aspiration precautions. Diet:  Diet Orders (From admission, onward)     Start     Ordered   07/22/23 2339  DIET DYS 3 Room service appropriate? Yes; Fluid consistency: Thin  Diet effective now       Question Answer Comment  Room service appropriate? Yes   Fluid consistency: Thin      07/22/23 2338           DVT prophylaxis: heparin injection 5,000 Units Start: 07/22/23 2345  Level of care: med tele   Code Status: Do not attempt resuscitation (DNR) - Comfort care  Subjective: Patient is seen and examined today morning. She is surrounded by family, has hard of hearing. Does feel little better, eating poor. Is on 1-2 L supplemental oxygen.  Physical Exam: Vitals:   07/24/23 0743 07/24/23 0846 07/24/23 1633 07/24/23 2036  BP: (!) 140/73  128/86 129/77  Pulse: 60  65   Resp: 16  15 16   Temp: 98.4 F (36.9 C)  97.6 F (36.4 C) 98 F (36.7 C)  TempSrc: Axillary  Axillary   SpO2: 100%  98% 97%  Weight:      Height:  5' (1.524 m)      General - Elderly ill Caucasian female, mild respiratory distress HEENT - PERRLA, EOMI, atraumatic head, non tender sinuses. Lung - Clear, diffuse rales, rhonchi, wheezes. Heart - S1, S2 heard, no murmurs, rubs, trace pedal edema. Abdomen - Soft, non tender, bowel sounds good Neuro - Alert, awake and oriented x 3, non focal exam. Skin - Warm and  dry.  Data Reviewed:      Latest Ref Rng & Units 07/23/2023    2:09 AM 07/22/2023    5:17 PM 09/26/2020    3:05 AM  CBC  WBC 4.0 - 10.5 K/uL 11.3  11.6  14.8   Hemoglobin 12.0 - 15.0 g/dL 08.6  57.8  46.9   Hematocrit 36.0 - 46.0 % 37.5  36.2  36.6   Platelets 150 - 400 K/uL 248  335  248       Latest Ref Rng & Units 07/23/2023    2:09 AM 07/22/2023    6:48 PM 09/26/2020    3:05 AM  BMP  Glucose 70 - 99 mg/dL 88  94  629   BUN 8 -  23 mg/dL 31  32  70   Creatinine 0.44 - 1.00 mg/dL 5.28 - 4.13 mg/dL 2.44    0.10  2.72  5.36   Sodium 135 - 145 mmol/L 147  146  137   Potassium 3.5 - 5.1 mmol/L 4.3  4.2  4.7   Chloride 98 - 111 mmol/L 113  114  100   CO2 22 - 32 mmol/L 23  23  29    Calcium 8.9 - 10.3 mg/dL 8.9  8.7  64.4    No results found.  Family Communication: Discussed with patient, family at bedside. They understand and agree. All questions answered.  Disposition: Status is: Inpatient Remains inpatient appropriate because: severity of illness  Planned Discharge Destination: Skilled nursing facility     Time spent: 39 minutes  Author: Marcelino Duster, MD 07/24/2023 9:56 PM Secure chat 7am to 7pm For on call review [email or web address].

## 2023-07-24 NOTE — Progress Notes (Signed)
 MC 6N31 [facility] Patient Visit   [name] is a current hospice patient, with hospice diagnosis of Alzheimer's Dementia. She was admitted 07/22/23 with diagnosis of severe sepsis due to pneumonia . [facility] was notified of patient was experiencing right hip pain and swelling, unable to obtain x-ray in facility due to increased pain. Family requested that patient be evaluated in the ED. Per Dr. Patric Dykes, hospice physician, this is a related hospital admission.    Visited patient at bedside. Patient alert and talking with family. Appears to be in no acute distress at this time. She had not eaten as of yet, but encouraged family yo offer a few bites to see how she would do. Family had concerns that patient pain medications would not be given until staff able to speak with a family member to give approval. Writer spoke with nurse caring for patient and asked that per family request, if patient appeared to be in discomfort could pain medication be given without having to call family first. Nurse stated that would be no problem. Family notified of the discussion with nurse.     Patient is GIP appropriate for evaluation and management of sepsis requiring IV antibiotics.    Vital Signs: 98.4/60/16  140/73   O2 100% on 2LPM   I&O: 1,894.6/100   Abnormal labs:  None new   Diagnostics:  None new    IV/PRN Meds: azithromycin (ZITHROMAX) 500 mg in sodium chloride 0.9 % 250 mL IVPB X1,  ceFEPIme (MAXIPIME) 2 g in sodium chloride 0.9 % 100 mL IVPB X1, morphine (PF) 2 MG/ML injection 1 mg IV x2    Assessment and Plan: Per Progress Note Lanae Boast, MD 3.15.25. None new at time of documentation. Principal Problem:   Sepsis due to pneumonia [name] Active Problems:   Hyperlipidemia   Essential hypertension   PACEMAKER, PERMANENT   Aspiration pneumonia (HCC)   Dementia (HCC)   Hypernatremia   Severe sepsis due to pneumonia Acute hypoxic aspiratory  failure due to pneumonia: Vitals remained stable at times BP soft.  Continue with empiric antibiotics-to see if she improves with DNR comfort measures. Cont ceftriaxone  azithromycin. Blood culture pending. Last Labs             Recent Labs  Lab 07/22/23 1717 07/22/23 1730 07/22/23 2251 07/23/23 0209  WBC 11.6*  --   --  11.3*  LATICACIDVEN  --  2.2* 1.1  --       DNR comfort Currently hospice patient: Family wants to see how she does with antibiotics if she does not improve then transitioned to comfort measures. Consult palliative care-but was informed hospice team will follow today.   Acute metabolic encephalopathy Advanced dementia: Currently enrolled in hospice at the facility.  Continue supportive care, delirium precaution.   Hypernatremia: Continue IV fluids and monitor labs.   CKD 3B: Creatinine currently 1.4-1.5 continue IV fluid hydration   Hypertension Hyperlipidemia: Holding meds.   Right hip pain: CT pelvis no acute finding.  Continue pain control. ?  If any benefits of repeating x-ray.     Discharge Planning: Ongoing   Family Contact:  spoke family at bedside.   IDT: Updated   Goals of Care: DNR   Roe Rutherford BSN, [facility] Liaison [phone]

## 2023-07-25 DIAGNOSIS — I10 Essential (primary) hypertension: Secondary | ICD-10-CM | POA: Diagnosis not present

## 2023-07-25 DIAGNOSIS — Z950 Presence of cardiac pacemaker: Secondary | ICD-10-CM | POA: Diagnosis not present

## 2023-07-25 DIAGNOSIS — F03C0 Unspecified dementia, severe, without behavioral disturbance, psychotic disturbance, mood disturbance, and anxiety: Secondary | ICD-10-CM | POA: Diagnosis not present

## 2023-07-25 DIAGNOSIS — E162 Hypoglycemia, unspecified: Secondary | ICD-10-CM

## 2023-07-25 DIAGNOSIS — A419 Sepsis, unspecified organism: Secondary | ICD-10-CM | POA: Diagnosis not present

## 2023-07-25 DIAGNOSIS — J189 Pneumonia, unspecified organism: Secondary | ICD-10-CM | POA: Diagnosis not present

## 2023-07-25 LAB — CBC
HCT: 40.7 % (ref 36.0–46.0)
Hemoglobin: 13.1 g/dL (ref 12.0–15.0)
MCH: 31.1 pg (ref 26.0–34.0)
MCHC: 32.2 g/dL (ref 30.0–36.0)
MCV: 96.7 fL (ref 80.0–100.0)
Platelets: 288 10*3/uL (ref 150–400)
RBC: 4.21 MIL/uL (ref 3.87–5.11)
RDW: 18.6 % — ABNORMAL HIGH (ref 11.5–15.5)
WBC: 9.4 10*3/uL (ref 4.0–10.5)
nRBC: 0.5 % — ABNORMAL HIGH (ref 0.0–0.2)

## 2023-07-25 LAB — BASIC METABOLIC PANEL
Anion gap: 17 — ABNORMAL HIGH (ref 5–15)
BUN: 21 mg/dL (ref 8–23)
CO2: 18 mmol/L — ABNORMAL LOW (ref 22–32)
Calcium: 9 mg/dL (ref 8.9–10.3)
Chloride: 108 mmol/L (ref 98–111)
Creatinine, Ser: 1.38 mg/dL — ABNORMAL HIGH (ref 0.44–1.00)
GFR, Estimated: 35 mL/min — ABNORMAL LOW (ref >60)
Glucose, Bld: 43 mg/dL — CL (ref 70–99)
Potassium: 3.3 mmol/L — ABNORMAL LOW (ref 3.5–5.1)
Sodium: 143 mmol/L (ref 135–145)

## 2023-07-25 LAB — GLUCOSE, CAPILLARY: Glucose-Capillary: 112 mg/dL — ABNORMAL HIGH (ref 70–99)

## 2023-07-25 MED ORDER — POTASSIUM CL IN DEXTROSE 5% 20 MEQ/L IV SOLN
20.0000 meq | INTRAVENOUS | Status: AC
  Administered 2023-07-25 (×2): 20 meq via INTRAVENOUS
  Filled 2023-07-25 (×3): qty 1000

## 2023-07-25 NOTE — Progress Notes (Signed)
   07/25/23 0824  Vitals  Temp (!) 97.4 F (36.3 C)  Temp Source Oral  BP 129/77  MAP (mmHg) 91  BP Location Right Arm  BP Method Automatic  Patient Position (if appropriate) Lying  Pulse Rate 100  Pulse Rate Source Monitor  Resp 16  Level of Consciousness  Level of Consciousness Alert  MEWS COLOR  MEWS Score Color Green  Oxygen Therapy  SpO2 (!) 74 %  Pain Assessment  Pain Scale Faces  Pain Score 0  MEWS Score  MEWS Temp 0  MEWS Systolic 0  MEWS Pulse 0  MEWS RR 0  MEWS LOC 0  MEWS Score 0

## 2023-07-25 NOTE — Progress Notes (Signed)
 Date and time results received: 07/25/23 0815 (use smartphrase ".now" to insert current time)  Test: glucose Critical Value: 50  Name of Provider Notified: Sreeram,MD [id number]  Orders Received? Or Actions Taken?: waiting new orders

## 2023-07-25 NOTE — Progress Notes (Signed)
 BS 112

## 2023-07-25 NOTE — Consult Note (Signed)
 Consultation Note Date: 07/25/2023 at 1230  Patient Name: Alexis Cobb  DOB: December 09, 1924  MRN: [id number]  Age / Sex: 89 y.o., female  PCP: Pcp, No Referring Physician: Marcelino Duster, MD  HPI/Patient Profile: 89 y.o. female  with past medical history of advanced dementia, hypertension hyperlipidemia S/P PPM, arthritis/osteoporosis  admitted on 07/22/2023 with AMS, fever, and right hip pain X 3 days. Patient is active hospice patient with [facility].  EMS was called by facility.  Patient found to be slightly hypotensive, tachycardic, and tachypneic.  CT of pelvis did not reveal any fractures.  CT of head revealed no intracranial acute findings.Chest x-ray revealed no pulmonary edema but positive for right lower lobe infiltrate.  COVID and flu tests were negative.  Patient was admitted for treatment of sepsis with community-acquired pneumonia. She was given IV abx.   PMT was consulted to support patient and family with goals of care discussions.   Clinical Assessment and Goals of Care: Extensive chart review completed prior to meeting patient including labs, vital signs, imaging, progress notes, orders, and available advanced directive documents from current and previous encounters.   Given that patient has been admitted under hospice GIP status, I consulted with hospice liaison.  He endorsed that additional support from PMT could be useful for family to have a better understanding of disease trajectory and disposition needs.  I then met with patient at bedside. She is alert and oriented to self and place. She is able to participate in symptom assessment with one word/short responses. She is not able to participate in GOC or medical decision making independently at this time.   Symptoms assessed. Pt c/o pain in left hip - despite chart reflecting patient c/o pain in right hip. Left hip not painful to  touch/palpation. She declined to move it to determine ROM. During my assessment, she shares that her hip "was hurting but not it doesn't."  Counseled with dayshift RN who endorses patient has not have c/o pain and has not exhibited non verbal signs of pain. No adjustment to [facility] needed at this time.   After assessing patient, I spoke with her niece Liborio Nixon over the phone. I introduced Palliative Medicine as specialized medical care for people living with serious illness. It focuses on providing relief from the symptoms and stress of a serious illness. The goal is to improve quality of life for both the patient and the family.   As far as functional and nutritional status patient has been wheelchair-bound and incontinent of bowel and urine for over a year and a half.  She is a resident of the long-term memory care section of [facility] nursing facility.  We discussed patient's current illness - PNA - and what it means in the larger context of patient's on-going co-morbidities - advanced dementia.   I attempted to elicit values and goals of care important to the patient.  Liborio Nixon shares that she and her family remain in agreement that they want the patient to be as comfortable as possible.  She  confirms DNR status.  She also endorses that family is in agreement that they would no longer want her to be treated with antibiotics or IV fluids should this type of illness recur.  Comfort measures discussed in detail.  She shares she is in agreement with attending's recommendations to stop lab work and to focus strictly on comfort.  Disposition discussed.  She shares family is concerned that patient will not get level of care necessary at [facility].  Reviewed that there are [facility] nursing staff in addition to hospice nursing staff will be monitoring patient for symptom management and end-of-life care.  Additionally, reviewed that patient may eventually qualify for hospice inpatient unit status.  Opportunity  and space provided for her to share her thoughts and express her concerns.  Reviewed that hospice liaison is in touch with facility social worker to determine if patient can return with current level of care.  Awaiting response from [facility] at this time.  From an inpatient palliative medicine perspective, no acute needs at this time.  Goals are clear.  Patient's symptoms are appropriately managed with current medication regimen.  Full comfort measures to continue.  Hospice liaison and TOC working closely on disposition.  Please re-engage with PMT if goals change, at patient/family's request, or if patient's health deteriorates during hospitalization.   Primary Decision Maker NEXT OF KIN  Physical Exam Vitals reviewed.  Constitutional:      General: She is not in acute distress.    Appearance: She is normal weight. She is not ill-appearing.  HENT:     Head: Normocephalic.     Mouth/Throat:     Mouth: Mucous membranes are moist.  Eyes:     Pupils: Pupils are equal, round, and reactive to light.  Cardiovascular:     Rate and Rhythm: Normal rate.  Pulmonary:     Effort: Pulmonary effort is normal.  Abdominal:     Palpations: Abdomen is soft.  Skin:    General: Skin is warm and dry.     Coloration: Skin is pale.  Neurological:     Comments: Oriented to self and place  Psychiatric:        Mood and Affect: Mood normal.        Behavior: Behavior normal.        Thought Content: Thought content normal.        Judgment: Judgment normal.     Palliative Assessment/Data: 20%     Thank you for this consult. Palliative medicine will continue to follow and assist holistically.   Time Total: 75 minutes  Time spent includes: Detailed review of medical records (labs, imaging, vital signs), medically appropriate exam (mental status, respiratory, cardiac, skin), discussed with treatment team, counseling and educating patient, family and staff, documenting clinical information,  medication management and coordination of care.  Signed by: Georgiann Cocker, DNP, FNP-BC Palliative Medicine   Please contact Palliative Medicine Team providers via [facility] for questions and concerns.

## 2023-07-25 NOTE — Progress Notes (Signed)
 Pt drank orange juice and tolerated it well. D5 hung and running @75 

## 2023-07-25 NOTE — Care Management Important Message (Signed)
 Important Message  Patient Details  Name: Alexis Cobb MRN: [id number] Date of Birth: December 24, 1924   Important Message Given:  Yes - Medicare IM     Sherilyn Banker 07/25/2023, 12:50 PM

## 2023-07-25 NOTE — Plan of Care (Signed)
  Problem: Clinical Measurements: Goal: Diagnostic test results will improve Outcome: Progressing   Problem: Respiratory: Goal: Ability to maintain adequate ventilation will improve Outcome: Progressing   Problem: Education: Goal: Knowledge of General Education information will improve Description: Including pain rating scale, medication(s)/side effects and non-pharmacologic comfort measures Outcome: Progressing   Problem: Activity: Goal: Risk for activity intolerance will decrease Outcome: Progressing   Problem: Nutrition: Goal: Adequate nutrition will be maintained Outcome: Progressing   Problem: Coping: Goal: Level of anxiety will decrease Outcome: Progressing   Problem: Pain Managment: Goal: General experience of comfort will improve and/or be controlled Outcome: Progressing   Problem: Safety: Goal: Ability to remain free from injury will improve Outcome: Progressing   Problem: Skin Integrity: Goal: Risk for impaired skin integrity will decrease Outcome: Progressing

## 2023-07-25 NOTE — Progress Notes (Signed)
  [facility] 6N31 [facility] Patient Visit   [name] is a current hospice patient, with hospice diagnosis of Alzheimer's Dementia. She was admitted 07/22/23 with diagnosis of severe sepsis due to pneumonia . [facility] was notified of patient was experiencing right hip pain and swelling, unable to obtain x-ray in facility due to increased pain. Family requested that patient be evaluated in the ED. Per Dr. Patric Dykes, hospice physician, this is a related hospital admission.    Visited patient at bedside and spoke with niece, Alexis Cobb by phone. Patient is restlessly fidgeting with blankets and confused but without distress noted. Discussed plan of care with niece who has concerns about the ability of Ms. Mastel current facility's ability to manage her needs post discharge. PMT planning to meet with patient/family per their request and I am reaching out to her home care nurse and social worker for assistance in assessing the facilities ability to provide adequate care.     Patient is GIP appropriate for evaluation and management of sepsis requiring IV antibiotics.    Vital Signs: 97.4/100/16     129/77   O2 97% on 2LPM via [place]   I&O: 850/300   Abnormal labs:  Glu 43, Cre 1.38, GFRe 35   Diagnostics:  None new    IV/PRN Meds: Zithromax 500mg  IV daily, D520K at 56ml/hr, Rocephin 2GIV x 1 (discontinued)   Assessment and Plan: Per Progress Note 3.16    Severe sepsis due to pneumonia Acute hypoxic aspiratory failure due to pneumonia: BP better. Continue with empiric antibiotics for another 2 days. DNR comfort measures. Cont ceftriaxone  azithromycin. Blood culture negative. Discussed with family regarding plan to continues antibiotics.   DNR comfort Currently hospice patient: Acute metabolic encephalopathy Advanced dementia: Currently enrolled in hospice at the facility.  Continue supportive care, delirium precaution.   Hypernatremia: Continue  IV fluids and monitor labs. Sodium stable.   CKD 3B: Creatinine currently 1.4-1.5 continue IV fluid hydration   Right hip pain: CT pelvis no acute finding.  Continue pain control.    Discharge Planning: Ongoing   Family Contact:  spoke family by phone   IDT: Updated   Goals of Care: DNR   Glenna Fellows BSN, RN, [facility] Liaison [phone]

## 2023-07-25 NOTE — Progress Notes (Signed)
 Progress Note   Patient: Alexis Cobb ZOX:[id number] DOB: April 12, 1925 DOA: 07/22/2023     3 DOS: the patient was seen and examined on 07/25/2023   Brief hospital course: 89 year old female with advanced dementia, hypertension hyperlipidemia S/P PPM, arthritis/osteoporosis currently on hospice care at [facility] brought to the ED with altered mental status fever and right hip pain x 3 days.  EMS was called and found to be slightly hypotensive  80/50 and tachycardic and tachypneic and warm given 1 L normal saline with some improvement and placed into left nasal cannula saturating 93% brought to the ED, was ill-appearing, tachycardic some tenderness in the right hip right knee and has ulceration on the right lower leg awake with eyes open but not answering questions.  Found to be tachycardic hypoxic chest x-ray no pulmonary edema has right lower lobe infiltrate, COVID flu test negative imaging  w/ x-ray hip and CT pelvis did not show any fracture on bilateral hip, showed colonic diverticulosis with diffusely decreased bone density . CT of the head no intracranial acute finding, patient was admitted for sepsis and community-acquired pneumonia to see if she improves if not then transition to comfort measures.  Assessment and Plan: Severe sepsis due to pneumonia Acute hypoxic aspiratory failure due to pneumonia: BP better. Continue with empiric antibiotics for another 2 days. DNR comfort measures. Cont ceftriaxone  azithromycin for another day. Discussed with family regarding plan to dc tomorrow back to facility with hospice care.  DNR comfort Currently hospice patient: Acute metabolic encephalopathy Advanced dementia: Currently enrolled in hospice at the facility.  Continue supportive care, delirium precaution.   Hypoglycemia- Encourage oral diet. Started on gentle IV fluids D5 with potassium supplementation.  Hypernatremia: Sodium improved with IV fluids. CKD 3B: Creatinine currently  1.4-1.5 continue IV fluid hydration   Hypertension Hyperlipidemia: Hold meds for now. BP soft.   Right hip pain: CT pelvis no acute finding.  Continue pain control.     Out of bed to chair. Incentive spirometry. Nursing supportive care. Fall, aspiration precautions. Diet:  Diet Orders (From admission, onward)     Start     Ordered   07/22/23 2339  DIET DYS 3 Room service appropriate? Yes; Fluid consistency: Thin  Diet effective now       Question Answer Comment  Room service appropriate? Yes   Fluid consistency: Thin      07/22/23 2338           DVT prophylaxis: heparin injection 5,000 Units Start: 07/22/23 2345  Level of care: med tele   Code Status: Do not attempt resuscitation (DNR) - Comfort care  Subjective: Patient is seen and examined today morning. She is weak, has cough, eating poor. Family at bedside. Continues to be on 2 L supplemental oxygen.  Blood sugars lower side on BMP this morning.  Physical Exam: Vitals:   07/24/23 2036 07/25/23 0538 07/25/23 0824 07/25/23 0851  BP: 129/77 (!) 125/93 129/77   Pulse:  100 100   Resp: 16 16 16    Temp: 98 F (36.7 C) 98.6 F (37 C) (!) 97.4 F (36.3 C)   TempSrc:  Oral Oral   SpO2: 97% 97% (!) 74% 97%  Weight:      Height:        General - Elderly ill Caucasian female, mild respiratory distress HEENT - PERRLA, EOMI, atraumatic head, non tender sinuses. Lung - Clear, diffuse rales, rhonchi, wheezes. Heart - S1, S2 heard, no murmurs, rubs, trace pedal edema. Abdomen -  Soft, non tender, bowel sounds good Neuro - Alert, awake and oriented x 3, non focal exam. Skin - Warm and dry.  Data Reviewed:      Latest Ref Rng & Units 07/25/2023    5:58 AM 07/23/2023    2:09 AM 07/22/2023    5:17 PM  CBC  WBC 4.0 - 10.5 K/uL 9.4  11.3  11.6   Hemoglobin 12.0 - 15.0 g/dL 41.6  60.6  30.1   Hematocrit 36.0 - 46.0 % 40.7  37.5  36.2   Platelets 150 - 400 K/uL 288  248  335       Latest Ref Rng & Units 07/25/2023     5:58 AM 07/23/2023    2:09 AM 07/22/2023    6:48 PM  BMP  Glucose 70 - 99 mg/dL 43  88  94   BUN 8 - 23 mg/dL 21  31  32   Creatinine 0.44 - 1.00 mg/dL 6.01  0.93    2.35  5.73   Sodium 135 - 145 mmol/L 143  147  146   Potassium 3.5 - 5.1 mmol/L 3.3  4.3  4.2   Chloride 98 - 111 mmol/L 108  113  114   CO2 22 - 32 mmol/L 18  23  23    Calcium 8.9 - 10.3 mg/dL 9.0  8.9  8.7    No results found.  Family Communication: Discussed with family at bedside. They understand and agree. All questions answered.  Disposition: Status is: Inpatient Remains inpatient appropriate because: severity of illness  Planned Discharge Destination: Skilled nursing facility with hospice care     Time spent: 38 minutes  Author: Marcelino Duster, MD 07/25/2023 2:03 PM Secure chat 7am to 7pm For on call review [email or web address].

## 2023-07-25 NOTE — Progress Notes (Signed)
 D5 ordered  Pt given juice

## 2023-07-25 NOTE — TOC Initial Note (Addendum)
 Transition of Care [facility]) - Initial/Assessment Note    Patient Details  Name: Alexis Cobb MRN: [id number] Date of Birth: 1924-11-01  Transition of Care [facility]) CM/SW Contact:    Lorri Frederick, LCSW Phone Number: 07/25/2023, 3:34 PM  Clinical Narrative:       Pt oriented x1, CSW spoke with pt niece Liborio Nixon by phone.  She confirms that pt is from memory care unit at [facility] and also receiving [facility] hospice services.  Liborio Nixon reports that she is currently waiting on a call from [facility] about the correct discharge plan for pt.  She requested CSW not call [facility] until she has spoken to [employer].  CSW did confirm with her that she is not interested in any sort of PT/rehab for pt.  CSW spoke with Shawn/[facility].  He is planning on pt returning to [facility] with home hospice, does not believe pt to be appropriate for residential hospice currently.  He will call Liborio Nixon and discuss this.     1610: Message from Shawn/[facility]: they have all spoken, plan for return to [facility] tomorrow. CSW spoke with [facility].  They can receive pt tomorrow, would need FL2 faxed to [phone].  They cannot take pt on IV abx.  MD informed.             Expected Discharge Plan: Memory Care (with [facility] hospice) Barriers to Discharge: Other (must enter comment) (family wanting to discuss plan with [facility])   Patient Goals and CMS Choice     Choice offered to / list presented to :  (niece Liborio Nixon)      Expected Discharge Plan and Services In-house Referral: Clinical Social Work   Post Acute Care Choice:  (tbd) Living arrangements for the past 2 months: Assisted Living Facility ([facility] memory care)                                      Prior Living Arrangements/Services Living arrangements for the past 2 months: Assisted Living Facility ([facility] memory care) Lives with:: Facility Resident Patient language and need for  interpreter reviewed:: Yes        Need for Family Participation in Patient Care: Yes (Comment) Care giver support system in place?: Yes (comment) Current home services: Other (comment) (na) Criminal Activity/Legal Involvement Pertinent to Current Situation/Hospitalization: No - Comment as needed  Activities of Daily Living   ADL Screening (condition at time of admission) Independently performs ADLs?: No Does the patient have a NEW difficulty with bathing/dressing/toileting/self-feeding that is expected to last >3 days?: No Does the patient have a NEW difficulty with getting in/out of bed, walking, or climbing stairs that is expected to last >3 days?: No Does the patient have a NEW difficulty with communication that is expected to last >3 days?: No Is the patient deaf or have difficulty hearing?: No Does the patient have difficulty seeing, even when wearing glasses/contacts?: No Does the patient have difficulty concentrating, remembering, or making decisions?: No  Permission Sought/Granted                  Emotional Assessment Appearance:: Appears stated age Attitude/Demeanor/Rapport: Unable to Assess Affect (typically observed): Unable to Assess Orientation: : Oriented to Self      Admission diagnosis:  Sepsis due to pneumonia (HCC) [J18.9, A41.9] Community acquired pneumonia of right lower lobe of lung [J18.9] Sepsis without acute organ dysfunction, due  to unspecified organism [facility]) [A41.9] Patient Active Problem List   Diagnosis Date Noted   Sepsis due to pneumonia (HCC) 07/22/2023   Hypernatremia 07/22/2023   Acute on chronic renal failure (HCC) 09/05/2020   DNR (do not resuscitate) discussion 09/05/2020   Dementia (HCC) 09/05/2020   Sacral decubitus ulcer 09/05/2020   Confusion 06/30/2020   LLQ pain 06/30/2020   UTI (urinary tract infection) 06/30/2020   Impacted cerumen of both ears 06/30/2020   Wound healing, delayed 03/21/2018   Acute hearing loss, right  03/21/2018   Gross hematuria 10/25/2017   Dysuria 02/05/2017   Excessive thirst 10/19/2016   Hearing loss 10/19/2016   Pelvic fracture (HCC) 10/19/2016   Pleural effusion    S/P thoracentesis    Acute blood loss anemia    Aspiration pneumonia (HCC)    Shortness of breath    Sore throat    Anemia    Acute respiratory failure with hypoxia (HCC) 05/01/2016   Diarrhea 05/01/2016   Rash 05/31/2015   Hives 12/10/2014   Chronic venous insufficiency 12/10/2014   Peripheral neuropathy 12/12/2013   Right shoulder pain 07/31/2012   Anxiety state 07/31/2012   Sinoatrial node dysfunction (HCC) 11/23/2011   Polyarthropathy 12/31/2010   Benign head tremor 12/31/2010   Cervical radicular pain 12/31/2010   Weakness 12/31/2010   Encounter for well adult exam with abnormal findings 11/27/2010   OSTEOARTHRITIS 01/14/2007   OSTEOPOROSIS 01/14/2007   Hyperlipidemia 01/06/2007   MITRAL REGURGITATION 01/06/2007   Essential hypertension 01/06/2007   Raynaud's syndrome 01/06/2007   PACEMAKER, PERMANENT 01/06/2007   PCP:  Pcp, No Pharmacy:   [facility] - [place], [place] - [address] [place] [address] Phone: [phone] Fax: [phone]     Social Drivers of Health (SDOH) Social History: SDOH Screenings   Food Insecurity: Patient Unable To Answer (07/23/2023)  Housing: Patient Unable To Answer (07/24/2023)  Transportation Needs: Patient Unable To Answer (07/23/2023)  Utilities: Patient Unable To Answer (07/23/2023)  Depression (PHQ2-9): Low Risk  (06/30/2020)  Social Connections: Patient Unable To Answer (07/23/2023)  Tobacco Use: Low Risk  (07/24/2023)   SDOH Interventions:     Readmission Risk Interventions     No data to display

## 2023-07-26 DIAGNOSIS — A419 Sepsis, unspecified organism: Secondary | ICD-10-CM | POA: Diagnosis not present

## 2023-07-26 DIAGNOSIS — H9190 Unspecified hearing loss, unspecified ear: Secondary | ICD-10-CM

## 2023-07-26 DIAGNOSIS — Z7189 Other specified counseling: Secondary | ICD-10-CM

## 2023-07-26 DIAGNOSIS — Z66 Do not resuscitate: Secondary | ICD-10-CM

## 2023-07-26 DIAGNOSIS — I10 Essential (primary) hypertension: Secondary | ICD-10-CM | POA: Diagnosis not present

## 2023-07-26 DIAGNOSIS — J690 Pneumonitis due to inhalation of food and vomit: Secondary | ICD-10-CM | POA: Diagnosis not present

## 2023-07-26 DIAGNOSIS — J189 Pneumonia, unspecified organism: Secondary | ICD-10-CM | POA: Diagnosis not present

## 2023-07-26 DIAGNOSIS — Z515 Encounter for palliative care: Secondary | ICD-10-CM | POA: Diagnosis not present

## 2023-07-26 MED ORDER — OXYCODONE HCL 5 MG PO TABS: 5.0000 mg | ORAL_TABLET | ORAL | 0 refills | Status: AC | PRN

## 2023-07-26 NOTE — TOC Transition Note (Signed)
 Transition of Care [facility]) - Discharge Note   Patient Details  Name: Alexis Cobb MRN: [id number] Date of Birth: [birth year]  [facility]) CM/SW Contact:  Alexis Sons, Alexis Cobb Phone Number: 07/26/2023, 4:28 PM   Clinical Narrative:     Alexis Cobb and DC summary sent to [facility]. Pt to return to [facility] with [facility]. CSW confirmed plan with Niece Alexis Cobb. [facility] is arranging DME including O2. Alexis Cobb will call 6N unit when they receive the o2. The RN or Diplomatic Services operational officer can then call [facility] to transport. [phone] (select option 3 for non emergency requests)  [facility] has to be used because pt is active with [facility]. Treatment team updated.   Final next level of care: Memory Care Barriers to Discharge: No Barriers Identified   Patient Goals and CMS Choice     Choice offered to / list presented to :  (niece Alexis Cobb)      Discharge Plan and Services Additional resources added to the After Visit Summary for   In-house Referral: Clinical Social Work   Post Acute Care Choice:  (tbd)                               Social Drivers of Health (SDOH) Interventions SDOH Screenings   Food Insecurity: Patient Unable To Answer (07/23/2023)  Housing: Patient Unable To Answer (07/24/2023)  Transportation Needs: Patient Unable To Answer (07/23/2023)  Utilities: Patient Unable To Answer (07/23/2023)  Depression (PHQ2-9): Low Risk  (06/30/2020)  Social Connections: Patient Unable To Answer (07/23/2023)  Tobacco Use: Low Risk  (07/24/2023)     Readmission Risk Interventions     No data to display

## 2023-07-26 NOTE — Progress Notes (Signed)
 Attempted to call report to [facility]. [phone]. no answer. Left voicemail with call back number for report

## 2023-07-26 NOTE — Discharge Summary (Signed)
 Physician Discharge Summary   Patient: Alexis Cobb MRN: [id number] DOB: [birth year]  Admit date:     07/22/2023  Discharge date: 07/26/23  Discharge Physician: Marcelino Duster   PCP: Pcp, No   Recommendations at discharge:    Continue hospice care at facility  Discharge Diagnoses: Principal Problem:   Sepsis due to pneumonia [facility]) Active Problems:   Hyperlipidemia   Essential hypertension   PACEMAKER, PERMANENT   Aspiration pneumonia (HCC)   Dementia (HCC)   Hypernatremia  Resolved Problems:   * No resolved hospital problems. *  Hospital Course: 89 year old female with advanced dementia, hypertension hyperlipidemia S/P PPM, arthritis/osteoporosis currently on hospice care at [facility] brought to the ED with altered mental status fever and right hip pain x 3 days.  EMS was called and found to be slightly hypotensive  80/50 and tachycardic and tachypneic and warm given 1 L normal saline with some improvement and placed into left nasal cannula saturating 93% brought to the ED, was ill-appearing, tachycardic some tenderness in the right hip right knee and has ulceration on the right lower leg awake with eyes open but not answering questions.  Found to be tachycardic hypoxic chest x-ray no pulmonary edema has right lower lobe infiltrate, COVID flu test negative imaging  w/ x-ray hip and CT pelvis did not show any fracture on bilateral hip, showed colonic diverticulosis with diffusely decreased bone density . CT of the head no intracranial acute finding, patient was admitted for sepsis and community-acquired pneumonia to see if she improves if not then transition to comfort measures.  Assessment and Plan: Severe sepsis due to pneumonia Acute hypoxic aspiratory failure due to pneumonia: Patient's overall clinical condition did not improve with empiric antibiotics for 5 days. Advised to continue DNR comfort measures at the facility. Discussed with family regarding plan to dc  today back to facility with hospice care.   DNR comfort Currently hospice patient: Acute metabolic encephalopathy Advanced dementia: Currently enrolled in hospice at the facility.  Continue supportive comfort care, delirium precautions.   Hypoglycemia- She is eating poor. Blood sugars better with fluids.   Hypernatremia: Sodium improved with IV fluids.  CKD 3B: Creatinine remained stable   Hypertension Hyperlipidemia: Hold meds for now. BP soft.   Right hip pain: CT pelvis no acute finding.   Continue pain control.  Comfort care at facility.        Consultants: Palliative care Procedures performed: None Disposition: Skilled nursing facility Diet recommendation:  Discharge Diet Orders (From admission, onward)     Start     Ordered   07/26/23 0000  Diet general       Comments: Comfort feeds   07/26/23 1339           Regular diet, comfort feeds DISCHARGE MEDICATION: Allergies as of 07/26/2023       Reactions   Sulfa Antibiotics    Unknown reaction per MAR   Ace Inhibitors Rash   Not listed on the [facility]   Cephalexin Rash   Penicillins Rash, Other (See Comments)   Has patient had a PCN reaction causing immediate rash, facial/tongue/throat swelling, SOB or lightheadedness with hypotension: No Has patient had a PCN reaction causing severe rash involving mucus membranes or skin necrosis: No Has patient had a PCN reaction that required hospitalization No Has patient had a PCN reaction occurring within the last 10 years: No If all of the above answers are "NO", then may proceed with Cephalosporin use.   Risedronate Sodium Rash  Not listed on the [facility]        Medication List    You have not been prescribed any medications.     Discharge Exam: Filed Weights   07/23/23 0100  Weight: 63.7 kg      07/26/2023    8:14 AM 07/26/2023    4:56 AM 07/25/2023    8:37 PM  Vitals with BMI  Systolic 135 87 155  Diastolic 112 72 106  Pulse 80 98 100     General - Elderly weak and ill Caucasian female, in respiratory distress HEENT - PERRLA, EOMI, atraumatic head, hard of hearing. Lung - Clear, diffuse rales, rhonchi, wheezes. Heart - S1, S2 heard, no murmurs, rubs, trace pedal edema. Abdomen - Soft, non tender, bowel sounds good Neuro - Alert, awake, not following commands, able to move extremities. Skin - Warm and dry.  Condition at discharge: poor  The results of significant diagnostics from this hospitalization (including imaging, microbiology, ancillary and laboratory) are listed below for reference.   Imaging Studies: CT Head Wo Contrast Result Date: 07/22/2023 CLINICAL DATA:  Mental status change, unknown cause EXAM: CT HEAD WITHOUT CONTRAST TECHNIQUE: Contiguous axial images were obtained from the base of the skull through the vertex without intravenous contrast. RADIATION DOSE REDUCTION: This exam was performed according to the departmental dose-optimization program which includes automated exposure control, adjustment of the mA and/or kV according to patient size and/or use of iterative reconstruction technique. COMPARISON:  CT head 09/26/2020 FINDINGS: Brain: Cerebral ventricle sizes are concordant with the degree of cerebral volume loss. Patchy and confluent areas of decreased attenuation are noted throughout the deep and periventricular white matter of the cerebral hemispheres bilaterally, compatible with chronic microvascular ischemic disease. No evidence of large-territorial acute infarction. No parenchymal hemorrhage. No mass lesion. No extra-axial collection. No mass effect or midline shift. No hydrocephalus. Basilar cisterns are patent. Vascular: No hyperdense vessel. Skull: No acute fracture or focal lesion. Sinuses/Orbits: Paranasal sinuses and mastoid air cells are clear. Bilateral lens replacement. Otherwise the orbits are unremarkable. Other: None. IMPRESSION: No acute intracranial abnormality. Electronically Signed   By:  Tish Frederickson M.D.   On: 07/22/2023 20:24   CT PELVIS WO CONTRAST Result Date: 07/22/2023 CLINICAL DATA:  Hip trauma, fracture suspected, xray done Pt very altered and unable to hold still or properly position for CT scan. Rescanned head due to motion artifact. Best images obtained. EXAM: CT PELVIS WITHOUT CONTRAST TECHNIQUE: Multidetector CT imaging of the pelvis was performed following the standard protocol without intravenous contrast. RADIATION DOSE REDUCTION: This exam was performed according to the departmental dose-optimization program which includes automated exposure control, adjustment of the mA and/or kV according to patient size and/or use of iterative reconstruction technique. COMPARISON:  CT abdomen pelvis 05/04/2016 FINDINGS: Urinary Tract: Pericentimeter right nephrolithiasis. No abnormality visualized. Bowel: Colonic diverticulosis. Limited evaluation due the anal and rectal region due to under distension and noncontrast study. Vascular/Lymphatic: Severe atherosclerotic plaque. No abdominal aorta or iliac aneurysm. Mild atherosclerotic plaque of the aorta and its branches. No abdominal, pelvic, or inguinal lymphadenopathy. Reproductive:  No mass or other significant abnormality Other: Pelvic floor laxity. No intraperitoneal free fluid. No intraperitoneal free gas. No organized fluid collection. Musculoskeletal: Diffusely decreased bone density. Bilateral hip demonstrates no acute displaced fracture or dislocation. No acute displaced fracture or diastasis of the bones of the pelvis. Old healed bilateral inferior and superior pubic rami fractures. No acute displaced fracture of the sacral bones. IMPRESSION: 1. Diffusely decreased bone density. 2.  Negative for acute traumatic injury. 3. Pericentimeter right nephrolithiasis. 4.  Aortic Atherosclerosis (ICD10-I70.0). Electronically Signed   By: Tish Frederickson M.D.   On: 07/22/2023 20:22   DG Knee 1-2 Views Right Result Date: 07/22/2023 CLINICAL  DATA:  99497 Leg pain 40981 EXAM: RIGHT KNEE - 1-2 VIEW COMPARISON:  X-ray left femur 03/28/2019 FINDINGS: No evidence of fracture, dislocation, or joint effusion. Cortical irregularity of the fibular neck suggestive of an old healed fracture. Tricompartmental joint narrowing. Soft tissues are unremarkable. Vascular calcifications. IMPRESSION: No acute displaced fracture or dislocation. Electronically Signed   By: Tish Frederickson M.D.   On: 07/22/2023 19:32   DG Hip Unilat W or Wo Pelvis 2-3 Views Right Result Date: 07/22/2023 CLINICAL DATA:  leg pain.  Fall EXAM: DG HIP (WITH OR WITHOUT PELVIS) 2-3V RIGHT; DG HIP (WITH OR WITHOUT PELVIS) 2-3V LEFT COMPARISON:  X-ray right hip 03/28/2019, x-ray left femur 03/28/2019 FINDINGS: Limited evaluation due to overlapping osseous structures and overlying soft tissues. No acute displaced fracture or diastasis of the bones of the pelvis. No acute displaced fracture or dislocation of the left hip. Abnormal projection of the right hip with query underlying intratrochanteric or femoral neck fracture versus artifact due to positioning. There is no evidence of severe arthropathy or other focal bone abnormality. Vascular calcifications. IMPRESSION: 1. Negative for definite acute traumatic injury. 2. Abnormal projection of the right hip with query underlying intratrochanteric or femoral neck fracture versus artifact due to positioning. Recommend repeat three-view right hip radiograph. Electronically Signed   By: Tish Frederickson M.D.   On: 07/22/2023 19:30   DG Hip Unilat W or Wo Pelvis 2-3 Views Left Result Date: 07/22/2023 CLINICAL DATA:  leg pain.  Fall EXAM: DG HIP (WITH OR WITHOUT PELVIS) 2-3V RIGHT; DG HIP (WITH OR WITHOUT PELVIS) 2-3V LEFT COMPARISON:  X-ray right hip 03/28/2019, x-ray left femur 03/28/2019 FINDINGS: Limited evaluation due to overlapping osseous structures and overlying soft tissues. No acute displaced fracture or diastasis of the bones of the pelvis. No  acute displaced fracture or dislocation of the left hip. Abnormal projection of the right hip with query underlying intratrochanteric or femoral neck fracture versus artifact due to positioning. There is no evidence of severe arthropathy or other focal bone abnormality. Vascular calcifications. IMPRESSION: 1. Negative for definite acute traumatic injury. 2. Abnormal projection of the right hip with query underlying intratrochanteric or femoral neck fracture versus artifact due to positioning. Recommend repeat three-view right hip radiograph. Electronically Signed   By: Tish Frederickson M.D.   On: 07/22/2023 19:30   DG Chest 1 View Result Date: 07/22/2023 CLINICAL DATA:  99497 Leg pain 19147.  Fall EXAM: CHEST  1 VIEW COMPARISON:  Chest x-ray 09/26/2020 FINDINGS: Minimal overlies the left upper lobe. The heart and mediastinal contours are within normal limits. Left chest wall dual lead pacemaker. Interval development of right lower lung zone airspace opacity. Chronic coarsened interstitial markings with no overt pulmonary edema. No pleural effusion. No pneumothorax. No acute osseous abnormality.  Sternotomy wires are intact. IMPRESSION: 1. Right lower lung zone pneumonia. Followup PA and lateral chest X-ray is recommended in 3-4 weeks following therapy to ensure resolution and exclude underlying malignancy. 2. Minimal overlies the left upper lobe-left upper lobe only partially visualized/evaluated. 3.  Aortic Atherosclerosis (ICD10-I70.0). Electronically Signed   By: Tish Frederickson M.D.   On: 07/22/2023 19:26    Microbiology: Results for orders placed or performed during the hospital encounter of 07/22/23  Resp panel by RT-PCR (RSV,  Flu A&B, Covid) Anterior Nasal Swab     Status: None   Collection Time: 07/22/23  5:03 PM   Specimen: Anterior Nasal Swab  Result Value Ref Range Status   SARS Coronavirus 2 by RT PCR NEGATIVE NEGATIVE Final   Influenza A by PCR NEGATIVE NEGATIVE Final   Influenza B by PCR  NEGATIVE NEGATIVE Final    Comment: (NOTE) The Xpert Xpress SARS-CoV-2/FLU/RSV plus assay is intended as an aid in the diagnosis of influenza from Nasopharyngeal swab specimens and should not be used as a sole basis for treatment. Nasal washings and aspirates are unacceptable for Xpert Xpress SARS-CoV-2/FLU/RSV testing.  Fact Sheet for Patients: BloggerCourse.com  Fact Sheet for Healthcare Providers: SeriousBroker.it  This test is not yet approved or cleared by the [place] FDA and has been authorized for detection and/or diagnosis of SARS-CoV-2 by FDA under an Emergency Use Authorization (EUA). This EUA will remain in effect (meaning this test can be used) for the duration of the COVID-19 declaration under Section 564(b)(1) of the Act, 21 U.S.C. section 360bbb-3(b)(1), unless the authorization is terminated or revoked.     Resp Syncytial Virus by PCR NEGATIVE NEGATIVE Final    Comment: (NOTE) Fact Sheet for Patients: BloggerCourse.com  Fact Sheet for Healthcare Providers: SeriousBroker.it  This test is not yet approved or cleared by the [place] FDA and has been authorized for detection and/or diagnosis of SARS-CoV-2 by FDA under an Emergency Use Authorization (EUA). This EUA will remain in effect (meaning this test can be used) for the duration of the COVID-19 declaration under Section 564(b)(1) of the Act, 21 U.S.C. section 360bbb-3(b)(1), unless the authorization is terminated or revoked.  Performed at [facility], [address], [place], [place] [address]   Blood Culture (routine x 2)     Status: None (Preliminary result)   Collection Time: 07/22/23  5:28 PM   Specimen: BLOOD  Result Value Ref Range Status   Specimen Description BLOOD SITE NOT SPECIFIED  Final   Special Requests   Final    BOTTLES DRAWN AEROBIC AND ANAEROBIC Blood Culture results may  not be optimal due to an inadequate volume of blood received in culture bottles   Culture   Final    NO GROWTH 4 DAYS Performed at [facility], [address], [place], [place] [address]    Report Status PENDING  Incomplete  Blood Culture (routine x 2)     Status: None (Preliminary result)   Collection Time: 07/22/23  5:37 PM   Specimen: BLOOD  Result Value Ref Range Status   Specimen Description BLOOD SITE NOT SPECIFIED  Final   Special Requests   Final    BOTTLES DRAWN AEROBIC AND ANAEROBIC Blood Culture adequate volume   Culture   Final    NO GROWTH 4 DAYS Performed at [facility], [address], [place], [place] [address]    Report Status PENDING  Incomplete    Labs: CBC: Recent Labs  Lab 07/22/23 1717 07/23/23 0209 07/25/23 0558  WBC 11.6* 11.3* 9.4  NEUTROABS 9.3*  --   --   HGB 11.9* 12.0 13.1  HCT 36.2 37.5 40.7  MCV 96.3 97.9 96.7  PLT 335 248 288   Basic Metabolic Panel: Recent Labs  Lab 07/22/23 1848 07/23/23 0209 07/25/23 0558  NA 146* 147* 143  K 4.2 4.3 3.3*  CL 114* 113* 108  CO2 23 23 18*  GLUCOSE 94 88 43*  BUN 32* 31*  21  CREATININE 1.47* 1.52*  1.52* 1.38*  CALCIUM 8.7* 8.9 9.0   Liver Function Tests: Recent Labs  Lab 07/22/23 1848 07/23/23 0209  AST 31 32  ALT 15 15  ALKPHOS 90 92  BILITOT 0.7 0.9  PROT 5.7* 5.2*  ALBUMIN 2.1* 2.1*   CBG: Recent Labs  Lab 07/25/23 0947  GLUCAP 112*    Discharge time spent: 35 minutes.  Signed: Marcelino Duster, MD [facility] 07/26/2023

## 2023-07-26 NOTE — Progress Notes (Signed)
 Per Farrel Gordon oxygen tank has been delivered to [facility] and pt is good to be transported now.

## 2023-07-26 NOTE — Progress Notes (Signed)
  Attempted to call report again to [facility]. [phone]. no answer. Left voicemail with call back number for report

## 2023-07-26 NOTE — Discharge Summary (Signed)
 Physician Discharge Summary   Patient: Alexis Cobb MRN: [id number] DOB: [birth year]  Admit date:     07/22/2023  Discharge date: 07/26/23  Discharge Physician: Marcelino Duster   PCP: Pcp, No   Recommendations at discharge:    Continue hospice care at facility  Discharge Diagnoses: Principal Problem:   Sepsis due to pneumonia [facility]) Active Problems:   Hyperlipidemia   Essential hypertension   PACEMAKER, PERMANENT   Aspiration pneumonia (HCC)   Dementia (HCC)   Hypernatremia  Resolved Problems:   * No resolved hospital problems. *  Hospital Course: 89 year old female with advanced dementia, hypertension hyperlipidemia S/P PPM, arthritis/osteoporosis currently on hospice care at [facility] brought to the ED with altered mental status fever and right hip pain x 3 days.  EMS was called and found to be slightly hypotensive  80/50 and tachycardic and tachypneic and warm given 1 L normal saline with some improvement and placed into left nasal cannula saturating 93% brought to the ED, was ill-appearing, tachycardic some tenderness in the right hip right knee and has ulceration on the right lower leg awake with eyes open but not answering questions.  Found to be tachycardic hypoxic chest x-ray no pulmonary edema has right lower lobe infiltrate, COVID flu test negative imaging  w/ x-ray hip and CT pelvis did not show any fracture on bilateral hip, showed colonic diverticulosis with diffusely decreased bone density . CT of the head no intracranial acute finding, patient was admitted for sepsis and community-acquired pneumonia to see if she improves if not then transition to comfort measures.  Assessment and Plan: Severe sepsis due to pneumonia Acute hypoxic aspiratory failure due to pneumonia: Patient's overall clinical condition did not improve with empiric antibiotics for 5 days. Advised to continue DNR comfort measures at the facility. Discussed with family regarding plan to dc  today back to facility with hospice care.   DNR comfort Currently hospice patient: Acute metabolic encephalopathy Advanced dementia: Currently enrolled in hospice at the facility.  Continue supportive comfort care, delirium precautions.   Hypoglycemia- She is eating poor. Blood sugars better with fluids.   Hypernatremia: Sodium improved with IV fluids.  CKD 3B: Creatinine remained stable   Hypertension Hyperlipidemia: Hold meds for now. BP soft.   Right hip pain: CT pelvis no acute finding.   Continue pain control.  Comfort care at facility.        Consultants: Palliative care Procedures performed: None Disposition: Skilled nursing facility Diet recommendation:  Discharge Diet Orders (From admission, onward)     Start     Ordered   07/26/23 0000  Diet general       Comments: Comfort feeds   07/26/23 1339           Regular diet, comfort feeds DISCHARGE MEDICATION: Allergies as of 07/26/2023       Reactions   Sulfa Antibiotics    Unknown reaction per MAR   Ace Inhibitors Rash   Not listed on the [facility]   Cephalexin Rash   Penicillins Rash, Other (See Comments)   Has patient had a PCN reaction causing immediate rash, facial/tongue/throat swelling, SOB or lightheadedness with hypotension: No Has patient had a PCN reaction causing severe rash involving mucus membranes or skin necrosis: No Has patient had a PCN reaction that required hospitalization No Has patient had a PCN reaction occurring within the last 10 years: No If all of the above answers are "NO", then may proceed with Cephalosporin use.   Risedronate Sodium Rash  Not listed on the [facility]        Medication List     TAKE these medications    oxyCODONE 5 MG immediate release tablet Commonly known as: Roxicodone Take 1 tablet (5 mg total) by mouth every 4 (four) hours as needed for severe pain (pain score 7-10).        Discharge Exam: Filed Weights   07/23/23 0100  Weight: 63.7 kg       07/26/2023    4:01 PM 07/26/2023    8:14 AM 07/26/2023    4:56 AM  Vitals with BMI  Systolic 106 135 87  Diastolic 88 112 72  Pulse 90 80 98    General - Elderly weak and ill Caucasian female, in respiratory distress HEENT - PERRLA, EOMI, atraumatic head, hard of hearing. Lung - Clear, diffuse rales, rhonchi, wheezes. Heart - S1, S2 heard, no murmurs, rubs, trace pedal edema. Abdomen - Soft, non tender, bowel sounds good Neuro - Alert, awake, not following commands, able to move extremities. Skin - Warm and dry.  Condition at discharge: poor  The results of significant diagnostics from this hospitalization (including imaging, microbiology, ancillary and laboratory) are listed below for reference.   Imaging Studies: CT Head Wo Contrast Result Date: 07/22/2023 CLINICAL DATA:  Mental status change, unknown cause EXAM: CT HEAD WITHOUT CONTRAST TECHNIQUE: Contiguous axial images were obtained from the base of the skull through the vertex without intravenous contrast. RADIATION DOSE REDUCTION: This exam was performed according to the departmental dose-optimization program which includes automated exposure control, adjustment of the mA and/or kV according to patient size and/or use of iterative reconstruction technique. COMPARISON:  CT head 09/26/2020 FINDINGS: Brain: Cerebral ventricle sizes are concordant with the degree of cerebral volume loss. Patchy and confluent areas of decreased attenuation are noted throughout the deep and periventricular white matter of the cerebral hemispheres bilaterally, compatible with chronic microvascular ischemic disease. No evidence of large-territorial acute infarction. No parenchymal hemorrhage. No mass lesion. No extra-axial collection. No mass effect or midline shift. No hydrocephalus. Basilar cisterns are patent. Vascular: No hyperdense vessel. Skull: No acute fracture or focal lesion. Sinuses/Orbits: Paranasal sinuses and mastoid air cells are clear.  Bilateral lens replacement. Otherwise the orbits are unremarkable. Other: None. IMPRESSION: No acute intracranial abnormality. Electronically Signed   By: Tish Frederickson M.D.   On: 07/22/2023 20:24   CT PELVIS WO CONTRAST Result Date: 07/22/2023 CLINICAL DATA:  Hip trauma, fracture suspected, xray done Pt very altered and unable to hold still or properly position for CT scan. Rescanned head due to motion artifact. Best images obtained. EXAM: CT PELVIS WITHOUT CONTRAST TECHNIQUE: Multidetector CT imaging of the pelvis was performed following the standard protocol without intravenous contrast. RADIATION DOSE REDUCTION: This exam was performed according to the departmental dose-optimization program which includes automated exposure control, adjustment of the mA and/or kV according to patient size and/or use of iterative reconstruction technique. COMPARISON:  CT abdomen pelvis 05/04/2016 FINDINGS: Urinary Tract: Pericentimeter right nephrolithiasis. No abnormality visualized. Bowel: Colonic diverticulosis. Limited evaluation due the anal and rectal region due to under distension and noncontrast study. Vascular/Lymphatic: Severe atherosclerotic plaque. No abdominal aorta or iliac aneurysm. Mild atherosclerotic plaque of the aorta and its branches. No abdominal, pelvic, or inguinal lymphadenopathy. Reproductive:  No mass or other significant abnormality Other: Pelvic floor laxity. No intraperitoneal free fluid. No intraperitoneal free gas. No organized fluid collection. Musculoskeletal: Diffusely decreased bone density. Bilateral hip demonstrates no acute displaced fracture or dislocation. No acute displaced  fracture or diastasis of the bones of the pelvis. Old healed bilateral inferior and superior pubic rami fractures. No acute displaced fracture of the sacral bones. IMPRESSION: 1. Diffusely decreased bone density. 2.  Negative for acute traumatic injury. 3. Pericentimeter right nephrolithiasis. 4.  Aortic  Atherosclerosis (ICD10-I70.0). Electronically Signed   By: Tish Frederickson M.D.   On: 07/22/2023 20:22   DG Knee 1-2 Views Right Result Date: 07/22/2023 CLINICAL DATA:  99497 Leg pain 78295 EXAM: RIGHT KNEE - 1-2 VIEW COMPARISON:  X-ray left femur 03/28/2019 FINDINGS: No evidence of fracture, dislocation, or joint effusion. Cortical irregularity of the fibular neck suggestive of an old healed fracture. Tricompartmental joint narrowing. Soft tissues are unremarkable. Vascular calcifications. IMPRESSION: No acute displaced fracture or dislocation. Electronically Signed   By: Tish Frederickson M.D.   On: 07/22/2023 19:32   DG Hip Unilat W or Wo Pelvis 2-3 Views Right Result Date: 07/22/2023 CLINICAL DATA:  leg pain.  Fall EXAM: DG HIP (WITH OR WITHOUT PELVIS) 2-3V RIGHT; DG HIP (WITH OR WITHOUT PELVIS) 2-3V LEFT COMPARISON:  X-ray right hip 03/28/2019, x-ray left femur 03/28/2019 FINDINGS: Limited evaluation due to overlapping osseous structures and overlying soft tissues. No acute displaced fracture or diastasis of the bones of the pelvis. No acute displaced fracture or dislocation of the left hip. Abnormal projection of the right hip with query underlying intratrochanteric or femoral neck fracture versus artifact due to positioning. There is no evidence of severe arthropathy or other focal bone abnormality. Vascular calcifications. IMPRESSION: 1. Negative for definite acute traumatic injury. 2. Abnormal projection of the right hip with query underlying intratrochanteric or femoral neck fracture versus artifact due to positioning. Recommend repeat three-view right hip radiograph. Electronically Signed   By: Tish Frederickson M.D.   On: 07/22/2023 19:30   DG Hip Unilat W or Wo Pelvis 2-3 Views Left Result Date: 07/22/2023 CLINICAL DATA:  leg pain.  Fall EXAM: DG HIP (WITH OR WITHOUT PELVIS) 2-3V RIGHT; DG HIP (WITH OR WITHOUT PELVIS) 2-3V LEFT COMPARISON:  X-ray right hip 03/28/2019, x-ray left femur 03/28/2019  FINDINGS: Limited evaluation due to overlapping osseous structures and overlying soft tissues. No acute displaced fracture or diastasis of the bones of the pelvis. No acute displaced fracture or dislocation of the left hip. Abnormal projection of the right hip with query underlying intratrochanteric or femoral neck fracture versus artifact due to positioning. There is no evidence of severe arthropathy or other focal bone abnormality. Vascular calcifications. IMPRESSION: 1. Negative for definite acute traumatic injury. 2. Abnormal projection of the right hip with query underlying intratrochanteric or femoral neck fracture versus artifact due to positioning. Recommend repeat three-view right hip radiograph. Electronically Signed   By: Tish Frederickson M.D.   On: 07/22/2023 19:30   DG Chest 1 View Result Date: 07/22/2023 CLINICAL DATA:  99497 Leg pain 62130.  Fall EXAM: CHEST  1 VIEW COMPARISON:  Chest x-ray 09/26/2020 FINDINGS: Minimal overlies the left upper lobe. The heart and mediastinal contours are within normal limits. Left chest wall dual lead pacemaker. Interval development of right lower lung zone airspace opacity. Chronic coarsened interstitial markings with no overt pulmonary edema. No pleural effusion. No pneumothorax. No acute osseous abnormality.  Sternotomy wires are intact. IMPRESSION: 1. Right lower lung zone pneumonia. Followup PA and lateral chest X-ray is recommended in 3-4 weeks following therapy to ensure resolution and exclude underlying malignancy. 2. Minimal overlies the left upper lobe-left upper lobe only partially visualized/evaluated. 3.  Aortic Atherosclerosis (ICD10-I70.0). Electronically Signed  By: Tish Frederickson M.D.   On: 07/22/2023 19:26    Microbiology: Results for orders placed or performed during the hospital encounter of 07/22/23  Resp panel by RT-PCR (RSV, Flu A&B, Covid) Anterior Nasal Swab     Status: None   Collection Time: 07/22/23  5:03 PM   Specimen: Anterior  Nasal Swab  Result Value Ref Range Status   SARS Coronavirus 2 by RT PCR NEGATIVE NEGATIVE Final   Influenza A by PCR NEGATIVE NEGATIVE Final   Influenza B by PCR NEGATIVE NEGATIVE Final    Comment: (NOTE) The Xpert Xpress SARS-CoV-2/FLU/RSV plus assay is intended as an aid in the diagnosis of influenza from Nasopharyngeal swab specimens and should not be used as a sole basis for treatment. Nasal washings and aspirates are unacceptable for Xpert Xpress SARS-CoV-2/FLU/RSV testing.  Fact Sheet for Patients: BloggerCourse.com  Fact Sheet for Healthcare Providers: SeriousBroker.it  This test is not yet approved or cleared by the [place] FDA and has been authorized for detection and/or diagnosis of SARS-CoV-2 by FDA under an Emergency Use Authorization (EUA). This EUA will remain in effect (meaning this test can be used) for the duration of the COVID-19 declaration under Section 564(b)(1) of the Act, 21 U.S.C. section 360bbb-3(b)(1), unless the authorization is terminated or revoked.     Resp Syncytial Virus by PCR NEGATIVE NEGATIVE Final    Comment: (NOTE) Fact Sheet for Patients: BloggerCourse.com  Fact Sheet for Healthcare Providers: SeriousBroker.it  This test is not yet approved or cleared by the [place] FDA and has been authorized for detection and/or diagnosis of SARS-CoV-2 by FDA under an Emergency Use Authorization (EUA). This EUA will remain in effect (meaning this test can be used) for the duration of the COVID-19 declaration under Section 564(b)(1) of the Act, 21 U.S.C. section 360bbb-3(b)(1), unless the authorization is terminated or revoked.  Performed at [facility], [address], [place], [place] [address]   Blood Culture (routine x 2)     Status: None (Preliminary result)   Collection Time: 07/22/23  5:28 PM   Specimen: BLOOD  Result Value  Ref Range Status   Specimen Description BLOOD SITE NOT SPECIFIED  Final   Special Requests   Final    BOTTLES DRAWN AEROBIC AND ANAEROBIC Blood Culture results may not be optimal due to an inadequate volume of blood received in culture bottles   Culture   Final    NO GROWTH 4 DAYS Performed at [facility], [address] [place], [place] [address]    Report Status PENDING  Incomplete  Blood Culture (routine x 2)     Status: None (Preliminary result)   Collection Time: 07/22/23  5:37 PM   Specimen: BLOOD  Result Value Ref Range Status   Specimen Description BLOOD SITE NOT SPECIFIED  Final   Special Requests   Final    BOTTLES DRAWN AEROBIC AND ANAEROBIC Blood Culture adequate volume   Culture   Final    NO GROWTH 4 DAYS Performed at [facility], [address] [place], [place] [address]    Report Status PENDING  Incomplete    Labs: CBC: Recent Labs  Lab 07/22/23 1717 07/23/23 0209 07/25/23 0558  WBC 11.6* 11.3* 9.4  NEUTROABS 9.3*  --   --   HGB 11.9* 12.0 13.1  HCT 36.2 37.5 40.7  MCV 96.3 97.9 96.7  PLT 335 248 288   Basic Metabolic Panel: Recent Labs  Lab 07/22/23 1848 07/23/23  0209 07/25/23 0558  NA 146* 147* 143  K 4.2 4.3 3.3*  CL 114* 113* 108  CO2 23 23 18*  GLUCOSE 94 88 43*  BUN 32* 31* 21  CREATININE 1.47* 1.52*  1.52* 1.38*  CALCIUM 8.7* 8.9 9.0   Liver Function Tests: Recent Labs  Lab 07/22/23 1848 07/23/23 0209  AST 31 32  ALT 15 15  ALKPHOS 90 92  BILITOT 0.7 0.9  PROT 5.7* 5.2*  ALBUMIN 2.1* 2.1*   CBG: Recent Labs  Lab 07/25/23 0947  GLUCAP 112*    Discharge time spent: 35 minutes.  Signed: Marcelino Duster, MD [facility] 07/26/2023

## 2023-07-26 NOTE — Progress Notes (Signed)
 Attempted to call report to [facility]. [phone] . Nurses kept putting me on hold and then accidentally hung up. Called back and got transferred and there was no answer. Will try again soon.

## 2023-07-26 NOTE — Progress Notes (Signed)
 Called [facility] to transport. [phone] to [place]

## 2023-07-26 NOTE — NC FL2 (Cosign Needed Addendum)
 [place] MEDICAID FL2 LEVEL OF CARE FORM     IDENTIFICATION  Patient Name: Alexis Cobb Birthdate: 05-31-1924 Sex: female Admission Date (Current Location): 07/22/2023  [facility] and [place] Number:  Producer, television/film/video and Address:  [facility] [place], [address] [place], [place] [address]      Provider Number: [id number]  Attending Physician Name and Address:  Marcelino Duster, MD  Relative Name and Phone Number:       Current Level of Care: Hospital Recommended Level of Care: Other (Comment) (memory care) Prior Approval Number:    Date Approved/Denied:   PASRR Number:    Discharge Plan: Other (Comment) (memory care)    Current Diagnoses: Patient Active Problem List   Diagnosis Date Noted   Sepsis due to pneumonia (HCC) 07/22/2023   Hypernatremia 07/22/2023   Acute on chronic renal failure (HCC) 09/05/2020   DNR (do not resuscitate) discussion 09/05/2020   Dementia (HCC) 09/05/2020   Sacral decubitus ulcer 09/05/2020   Confusion 06/30/2020   LLQ pain 06/30/2020   UTI (urinary tract infection) 06/30/2020   Impacted cerumen of both ears 06/30/2020   Wound healing, delayed 03/21/2018   Acute hearing loss, right 03/21/2018   Gross hematuria 10/25/2017   Dysuria 02/05/2017   Excessive thirst 10/19/2016   Hearing loss 10/19/2016   Pelvic fracture (HCC) 10/19/2016   Pleural effusion    S/P thoracentesis    Acute blood loss anemia    Aspiration pneumonia (HCC)    Shortness of breath    Sore throat    Anemia    Acute respiratory failure with hypoxia (HCC) 05/01/2016   Diarrhea 05/01/2016   Rash 05/31/2015   Hives 12/10/2014   Chronic venous insufficiency 12/10/2014   Peripheral neuropathy 12/12/2013   Right shoulder pain 07/31/2012   Anxiety state 07/31/2012   Sinoatrial node dysfunction (HCC) 11/23/2011   Polyarthropathy 12/31/2010   Benign head tremor 12/31/2010   Cervical radicular pain 12/31/2010   Weakness 12/31/2010    Encounter for well adult exam with abnormal findings 11/27/2010   OSTEOARTHRITIS 01/14/2007   OSTEOPOROSIS 01/14/2007   Hyperlipidemia 01/06/2007   MITRAL REGURGITATION 01/06/2007   Essential hypertension 01/06/2007   Raynaud's syndrome 01/06/2007   PACEMAKER, PERMANENT 01/06/2007    Orientation RESPIRATION BLADDER Height & Weight        Other (Comment) (2L nasal cannula) Incontinent Weight: 140 lb 6.9 oz (63.7 kg) Height:  5' (152.4 cm)  BEHAVIORAL SYMPTOMS/MOOD NEUROLOGICAL BOWEL NUTRITION STATUS      Incontinent Diet (regular)  AMBULATORY STATUS COMMUNICATION OF NEEDS Skin   Extensive Assist Verbally Normal                       Personal Care Assistance Level of Assistance  Bathing, Dressing, Feeding Bathing Assistance: Maximum assistance Feeding assistance: Limited assistance Dressing Assistance: Maximum assistance     Functional Limitations Info  Sight, Hearing, Speech Sight Info: Adequate Hearing Info: Adequate Speech Info: Adequate    SPECIAL CARE FACTORS FREQUENCY                       Contractures Contractures Info: Not present    Additional Factors Info  Code Status, Allergies Code Status Info: DNR Allergies Info: Sulfa Antibiotics, ace inhibitors, Cephalexin, penicillins, Risedronate Sodium             Discharge Medications: Medication List       TAKE these medications     oxyCODONE  5 MG immediate release tablet Commonly known as: Roxicodone Take 1 tablet (5 mg total) by mouth every 4 (four) hours as needed for severe pain (pain score 7-10).    Relevant Imaging Results:  Relevant Lab Results:   Additional Information SSN:[id number]  Shaneen Reeser Aris Lot, LCSW

## 2023-07-26 NOTE — Progress Notes (Signed)
 Daily Progress Note   Patient Name: Alexis Cobb       Date: 07/26/2023 DOB: 1925-05-08  Age: 89 y.o. MRN#: [id number] Attending Physician: Marcelino Duster, MD Primary Care Physician: Pcp, No Admit Date: 07/22/2023 Length of Stay: 4 days  Reason for Consultation/Follow-up: Establishing goals of care  HPI/Patient Profile:  89 y.o. female  with past medical history of advanced dementia, hypertension hyperlipidemia S/P PPM, arthritis/osteoporosis  admitted on 07/22/2023 with AMS, fever, and right hip pain X 3 days. Patient is active hospice patient with [facility].   EMS was called by facility.  Patient found to be slightly hypotensive, tachycardic, and tachypneic.  CT of pelvis did not reveal any fractures.  CT of head revealed no intracranial acute findings.Chest x-ray revealed no pulmonary edema but positive for right lower lobe infiltrate.  COVID and flu tests were negative.   Patient was admitted for treatment of sepsis with community-acquired pneumonia. She was given IV abx.    PMT was consulted to support patient and family with goals of care discussions.   Subjective:   Subjective: Chart Reviewed. Updates received. Patient Assessed. Created space and opportunity for patient  and family to explore thoughts and feelings regarding current medical situation.  Today's Discussion: Today saw the patient at bedside.  Prior to walking and I discussed with the patient's nurse and reviewed the chart.  Tentative discharge plan is to return to [facility] with hospice in place through [facility].  She is awake and alert, minimally oriented and confused.  I saw the patient at bedside she was laying on her side, mumbling a bit.  She appears comfortable, respirations are even and unlabored.  No grimacing or other signs of discomfort or distress noted.  She is noted to be hard of hearing.  She is confused, able to answer questions.  More than yes/no answers are a bit intelligible  and difficult to understand, seem to be somewhat confused.  She denies significant pain, nausea, vomiting.  After seeing the patient I reached out to her niece Tasharra Nodine.  I gave her details of my assessment, confirmed that the plan is likely home today to her living facility with hospice in place.  She states that she spoke with [employer] and they feel that they could manage her current care at [facility].  I shared that the patient remain in the hospital tomorrow I will be back to check on her and manage her symptom needs.  I provided emotional and general support through therapeutic listening, empathy, sharing of stories, and other techniques. I answered all questions and addressed all concerns to the best of my ability.  ROS Limited due to mental status/confusion Review of Systems  Constitutional:        Denies pain in general  Respiratory:  Negative for shortness of breath.   Gastrointestinal:  Negative for nausea and vomiting.    Objective:   Vital Signs:  BP (!) 135/112 (BP Location: Left Arm)   Pulse 80   Temp 97.6 F (36.4 C)   Resp 19   Ht 5' (1.524 m)   Wt 63.7 kg   SpO2 95%   BMI 27.43 kg/m   Physical Exam Vitals and nursing note reviewed.  Constitutional:      General: She is not in acute distress.    Appearance: She is ill-appearing.  HENT:     Head: Normocephalic and atraumatic.  Cardiovascular:     Rate and Rhythm: Normal rate.  Pulmonary:  Effort: Pulmonary effort is normal. No respiratory distress.     Breath sounds: No wheezing or rhonchi.  Abdominal:     General: Abdomen is flat. Bowel sounds are normal. There is no distension.     Palpations: Abdomen is soft.     Tenderness: There is no abdominal tenderness.  Skin:    General: Skin is warm and dry.  Neurological:     Mental Status: She is alert. She is disoriented and confused.  Psychiatric:        Mood and Affect: Mood normal.        Behavior: Behavior normal.      Palliative Assessment/Data: 10-20%    Existing Vynca/ACP Documentation: DNR effective 09/05/2020  Assessment & Plan:   Impression: Present on Admission:  Sepsis due to pneumonia (HCC)  Hyperlipidemia  Essential hypertension  PACEMAKER, PERMANENT  Aspiration pneumonia (HCC)  Dementia (HCC)  Hypernatremia  SUMMARY OF RECOMMENDATIONS   DNR-comfort Continued comfort care See symptom management orders below Anticipate discharge back to memory care with hospice in place Palliative medicine will continue to follow daily while inpatient for symptom management/comfort care  Symptom Management:  Morphine 1 mg IV every 3 hours as needed severe pain  Code Status: DNR-comfort  Prognosis: < 4 weeks  Discharge Planning: Skilled Nursing Facility with Hospice  Discussed with: Patient, family, medical team, nursing team  Thank you for allowing [place] to participate in the care of TOWANA STENGLEIN PMT will continue to support holistically.  Time Total: 27 min  Detailed review of medical records (labs, imaging, vital signs), medically appropriate exam, discussed with treatment team, counseling and education to patient, family, & staff, documenting clinical information, medication management, coordination of care  Wynne Dust, NP Palliative Medicine Team  Team Phone # [phone] (Nights/Weekends)  01/06/2021, 8:17 AM

## 2023-07-27 LAB — CULTURE, BLOOD (ROUTINE X 2)
Culture: NO GROWTH
Culture: NO GROWTH
Special Requests: ADEQUATE

## 2023-08-09 DEATH — deceased

## 2023-08-29 ENCOUNTER — Ambulatory Visit: Payer: Medicare Other
